# Patient Record
Sex: Female | Born: 1970 | Race: Black or African American | Hispanic: No | Marital: Married | Smoking: Never smoker
Health system: Southern US, Community
[De-identification: ages and names within clinical notes are randomized; demographics above are authoritative.]

## PROBLEM LIST (undated history)

## (undated) DIAGNOSIS — J45909 Unspecified asthma, uncomplicated: Secondary | ICD-10-CM

## (undated) DIAGNOSIS — M1990 Unspecified osteoarthritis, unspecified site: Secondary | ICD-10-CM

## (undated) DIAGNOSIS — J36 Peritonsillar abscess: Secondary | ICD-10-CM

## (undated) DIAGNOSIS — E119 Type 2 diabetes mellitus without complications: Secondary | ICD-10-CM

## (undated) DIAGNOSIS — I10 Essential (primary) hypertension: Secondary | ICD-10-CM

## (undated) HISTORY — PX: TUBAL LIGATION: SHX77

---

## 2007-02-01 ENCOUNTER — Emergency Department (HOSPITAL_COMMUNITY): Admission: EM | Admit: 2007-02-01 | Discharge: 2007-02-01 | Payer: Self-pay | Admitting: Emergency Medicine

## 2008-03-22 ENCOUNTER — Inpatient Hospital Stay (HOSPITAL_COMMUNITY): Admission: AD | Admit: 2008-03-22 | Discharge: 2008-03-24 | Payer: Self-pay | Admitting: Obstetrics and Gynecology

## 2008-03-23 ENCOUNTER — Encounter (INDEPENDENT_AMBULATORY_CARE_PROVIDER_SITE_OTHER): Payer: Self-pay | Admitting: Obstetrics and Gynecology

## 2011-05-02 NOTE — Op Note (Signed)
NAMEDagmar, [name]            ACCOUNT NO.:  [id number]   MEDICAL RECORD NO.:  [id number]          PATIENT TYPE:  INP   LOCATION:  9127                          FACILITY:  [facility]   PHYSICIAN:  Zenaida Niece, M.D.DATE OF BIRTH:  [birth year]   DATE OF PROCEDURE:  03/23/2008  DATE OF DISCHARGE:                               OPERATIVE REPORT   PREOPERATIVE DIAGNOSES:  1. Postpartum.  2. Desires surgical sterility.   POSTOPERATIVE DIAGNOSES:  1. Postpartum.  2. Desires surgical sterility.   PROCEDURE:  Bilateral partial salpingectomy.   SURGEON:  Zenaida Niece, M.D.   ANESTHESIA:  Epidural.   ESTIMATED BLOOD LOSS:  Less than 50 mL.   FINDINGS:  The patient had normal postpartum anatomy.   SPECIMENS:  Portions of bilateral fallopian tubes sent to pathology.   COMPLICATIONS:  None.   PROCEDURE IN DETAIL:  The patient was taken to the operating room and  placed in the dorsal supine position.  Her previously placed epidural  was already dosed.  The abdomen was then prepped and draped in the usual  sterile fashion.  The level of her anesthesia was found to be adequate.  Infraumbilical skin was infiltrated with quarter percent Marcaine and a  3 cm horizontal incision was made.  The incision was carried down to  fascia which was elevated and entered sharply with Mayo scissors.  This  also entered the peritoneal cavity.  Both fallopian tubes, first the  left and then the right were identified and traced to their fimbriated  ends.  A lap pad was used to pack bowel away on the right side.  The  middle portion of each tube was elevated with a Babcock clamp.  The  knuckle of tube was then ligated with zero plain gut suture.  This  knuckle of tube was then removed sharply.  On both sides both tubal  ostia were identified and the stumps were hemostatic.  The sponge on the  right side was then removed.  The  fascia was closed with running zero Vicryl.  The skin was then closed  with a running subcuticular suture of 4-0 Vicryl followed by a Band-Aid.  The patient tolerated the procedure well.  She was taken to the recovery  room in stable condition.  Counts were correct x2.      Zenaida Niece, M.D.  Electronically Signed     TDM/MEDQ  D:  03/23/2008  T:  03/23/2008  Job:  [id number]

## 2011-05-02 NOTE — Discharge Summary (Signed)
NAMETesia, [facility]            ACCOUNT NO.:  [id number]   MEDICAL RECORD NO.:  [id number]          PATIENT TYPE:  INP   LOCATION:  9127                          FACILITY:  [facility]   PHYSICIAN:  Zenaida Niece, M.D.DATE OF BIRTH:  1971/06/09   DATE OF ADMISSION:  03/22/2008  DATE OF DISCHARGE:  03/24/2008                               DISCHARGE SUMMARY   ADMISSION DIAGNOSIS:  Intrauterine pregnancy at 38 weeks, pregnancy-  induced hypertension desires surgical sterility.   DISCHARGE DIAGNOSIS:  Intrauterine pregnancy at 38 weeks, pregnancy-  induced hypertension desires surgical sterility.   PROCEDURE:  On March 22, 2008, she had a spontaneous vaginal delivery and  on March 23, 2008, she had a bilateral partial salpingectomy.   HISTORY AND PHYSICAL:  This is a 40 year old black female gravida 34,  para 6-1-1-7 with an EGA of [redacted] weeks, who presents with the complaint of  ruptured membranes at approximately 12:15 on March 22, 2008.  She then  had irregular contractions.  Evaluation in triage confirmed rupture of  membranes with light meconium and cervix was 4 cm dilated.  Prenatal  care complicated by UTIs, treated with Macrobid and the fact that she  wants tubal ligation.   PRENATAL LABS:  Her blood type is A+ with negative antibody screen, RPR  nonreactive, rubella immune, hepatitis B surface antigen negative, HIV  negative, gonorrhea and chlamydia negative, cystic fibrosis negative, 1-  hour Glucola 134, group B strep is negative.   PAST OB HISTORY:  Vaginal delivery at term x5 and at 36 weeks x1,  weights ranging from 5 pounds 3 ounces to 6 pounds 11 ounces.  She did  have PIH with her last pregnancy in 2003.   PAST GYN HISTORY:  Remote history of chlamydia.   PAST MEDICAL HISTORY:  Mild asthma,  hemoglobin AS.   PHYSICAL EXAMINATION:  VITAL SIGNS:  She is afebrile with stable vital  signs except blood pressures 14Os-150s/90-100.  HEART:  Fetal heart tracing is reassuring  with some variable  decelerations.  ABDOMEN:  Gravid, nontender with an estimated fetal weight of 7 pounds.  Cervix on my first exam was complete, complete and plus 2.   HOSPITAL COURSE:  The patient was admitted and started on Pitocin and  received an epidural.  PIH labs revealed no significant abnormalities.  Blood pressure remained labile.  She progressed to complete, pushed well  and on the afternoon of March 22, 2008, had a vaginal delivery of a  viable female infant with Apgars of 8 and 9, the weighed 6 pounds 2  ounces.  Loose nuchal cord and shoulder cord were reduced.  Placenta  delivered spontaneous was intact and perineum was intact and estimated  blood loss was less than 500 mL.   POSTPARTUM:  She had no significant complications.  Predelivery  hemoglobin 12.3, postdelivery is 12.0.  She did wish to undergo tubal  ligation.  This was performed on March 23, 2008, under epidural  anesthesia without complications.  On postpartum day #2, she was felt to  be stable enough for discharge home.   DISCHARGE INSTRUCTIONS:  Regular diet.  Pelvic rest.  No strenuous  activity.  Followup is in 6 weeks.   MEDICATIONS:  Medications are Percocet #30 one to two p.o. q.4-6 hours  p.r.n. pain and over-the-counter ibuprofen as needed and she is given  our discharge pamphlet.      Zenaida Niece, M.D.  Electronically Signed     TDM/MEDQ  D:  03/24/2008  T:  03/24/2008  Job:  [id number]

## 2011-08-28 ENCOUNTER — Emergency Department (HOSPITAL_COMMUNITY)
Admission: EM | Admit: 2011-08-28 | Discharge: 2011-08-28 | Disposition: A | Discharge status: Home / Self Care | Payer: Medicaid Other | Attending: Emergency Medicine | Admitting: Emergency Medicine

## 2011-08-28 DIAGNOSIS — Y92009 Unspecified place in unspecified non-institutional (private) residence as the place of occurrence of the external cause: Secondary | ICD-10-CM | POA: Insufficient documentation

## 2011-08-28 DIAGNOSIS — S01409A Unspecified open wound of unspecified cheek and temporomandibular area, initial encounter: Secondary | ICD-10-CM | POA: Insufficient documentation

## 2011-08-28 DIAGNOSIS — R51 Headache: Secondary | ICD-10-CM | POA: Insufficient documentation

## 2011-08-28 DIAGNOSIS — W1809XA Striking against other object with subsequent fall, initial encounter: Secondary | ICD-10-CM | POA: Insufficient documentation

## 2011-09-12 LAB — COMPREHENSIVE METABOLIC PANEL
ALT: 15
AST: 18
Albumin: 2.8 — ABNORMAL LOW
Alkaline Phosphatase: 106
BUN: 8
Chloride: 105
GFR calc Af Amer: >60
Potassium: 3.8
Sodium: 135
Total Bilirubin: 0.4
Total Protein: 6.3

## 2011-09-12 LAB — CCBB MATERNAL DONOR DRAW

## 2011-09-12 LAB — URINALYSIS, DIPSTICK ONLY
Glucose, UA: NEGATIVE
Hgb urine dipstick: NEGATIVE
Specific Gravity, Urine: 1.01

## 2011-09-12 LAB — CBC
HCT: 35.7 — ABNORMAL LOW
HCT: 36
Hemoglobin: 12
Platelets: 270
Platelets: 289
WBC: 10.9 — ABNORMAL HIGH
WBC: 7.7

## 2012-01-09 ENCOUNTER — Emergency Department (HOSPITAL_COMMUNITY): Payer: Medicaid Other

## 2012-01-09 ENCOUNTER — Emergency Department (HOSPITAL_COMMUNITY)
Admission: EM | Admit: 2012-01-09 | Discharge: 2012-01-09 | Disposition: A | Discharge status: Home / Self Care | Payer: Medicaid Other | Attending: Emergency Medicine | Admitting: Emergency Medicine

## 2012-01-09 ENCOUNTER — Encounter (HOSPITAL_COMMUNITY): Payer: Self-pay | Admitting: *Deleted

## 2012-01-09 DIAGNOSIS — W19XXXA Unspecified fall, initial encounter: Secondary | ICD-10-CM

## 2012-01-09 DIAGNOSIS — W108XXA Fall (on) (from) other stairs and steps, initial encounter: Secondary | ICD-10-CM | POA: Insufficient documentation

## 2012-01-09 DIAGNOSIS — M545 Low back pain, unspecified: Secondary | ICD-10-CM | POA: Insufficient documentation

## 2012-01-09 MED ORDER — OXYCODONE-ACETAMINOPHEN 5-325 MG PO TABS
2.0000 | ORAL_TABLET | Freq: Once | ORAL | Status: AC
  Administered 2012-01-09: 2 via ORAL
  Filled 2012-01-09: qty 2

## 2012-01-09 MED ORDER — DIAZEPAM 5 MG PO TABS
5.0000 mg | ORAL_TABLET | Freq: Once | ORAL | Status: AC
  Administered 2012-01-09: 5 mg via ORAL
  Filled 2012-01-09: qty 1

## 2012-01-09 MED ORDER — KETOROLAC TROMETHAMINE 60 MG/2ML IM SOLN
60.0000 mg | Freq: Once | INTRAMUSCULAR | Status: AC
  Administered 2012-01-09: 60 mg via INTRAMUSCULAR
  Filled 2012-01-09: qty 2

## 2012-01-09 MED ORDER — DIAZEPAM 5 MG PO TABS: 5.0000 mg | ORAL_TABLET | Freq: Three times a day (TID) | ORAL | Status: AC | PRN

## 2012-01-09 MED ORDER — OXYCODONE-ACETAMINOPHEN 5-325 MG PO TABS: 1.0000 | ORAL_TABLET | Freq: Four times a day (QID) | ORAL | Status: AC | PRN

## 2012-01-09 NOTE — ED Notes (Signed)
Pt states she fell down the stairs this am. Reports sever lower back. Pt reports when she fell she feels like she twisted her back. Pt states she laid down and rested and states that pain is not getting any better.

## 2012-01-09 NOTE — ED Provider Notes (Signed)
Medical screening examination/treatment/procedure(s) were performed by non-physician practitioner and as supervising physician I was immediately available for consultation/collaboration.   Gerhard Munch, MD 01/09/12 [phone]

## 2012-01-09 NOTE — ED Provider Notes (Signed)
History     CSN: [id number]  Arrival date & time 01/09/12  1209   First MD Initiated Contact with Patient 01/09/12 1351      Chief Complaint  Patient presents with  . Fall  . Back Pain     The history is provided by the patient.   the patient is an otherwise healthy mildly obese 41 year old female who reports she missed a step while walking down her stairs this morning carrying her daughter and fell onto her left hip. She now complains of pain to the lower back is sharp, constant, severe, nonradiating. She denies any associated numbness, weakness, fecal or urinary incontinence, saddle anesthesia. There has been no prior treatment.  History reviewed. No pertinent past medical history.  History reviewed. No pertinent past surgical history.  History reviewed. No pertinent family history.  History  Substance Use Topics  . Smoking status: Never Smoker   . Smokeless tobacco: Not on file  . Alcohol Use: No     Review of Systems  Musculoskeletal: Positive for back pain.   10 systems reviewed and are negative for acute change except as noted in the HPI.  Allergies  Ibuprofen  Home Medications  No current outpatient prescriptions on file.  BP 125/87  Pulse 86  Temp(Src) 98.1 F (36.7 C) (Oral)  Resp 18  SpO2 98%  Physical Exam  Nursing note and vitals reviewed. Constitutional: She is oriented to person, place, and time. She appears well-developed and well-nourished.       Uncomfortable appearing. Morbidly obese  HENT:  Head: Normocephalic and atraumatic.  Right Ear: External ear normal.  Left Ear: External ear normal.  Eyes: Pupils are equal, round, and reactive to light.  Neck: Normal range of motion. Neck supple.  Cardiovascular: Normal rate and regular rhythm.   Pulmonary/Chest: Effort normal. No respiratory distress. She exhibits no tenderness.  Abdominal: Soft. She exhibits no distension. There is no tenderness.  Musculoskeletal: Normal range of motion. She  exhibits tenderness. She exhibits no edema.       Cervical and thoracic regions are without any bony tenderness, muscular tenderness, spasm, deformity. The lumbar region does demonstrate midline tenderness to palpation as well as paralumbar tenderness, worse on the left with no palpable spasm or visible deformity. Ears full active range of motion of all extremities with good strength 5 out of 5 throughout  Neurological: She is alert and oriented to person, place, and time. She has normal reflexes. No cranial nerve deficit. Coordination normal.       Gait with antalgia but no apparent ataxia. Sensation is intact to light touch  Skin: Skin is warm and dry.  Psychiatric: She has a normal mood and affect.    ED Course  Procedures (including critical care time)  Labs Reviewed - No data to display Dg Lumbar Spine Complete  01/09/2012  *RADIOLOGY REPORT*  Clinical Data: Fall  LUMBAR SPINE - COMPLETE 4+ VIEW  Comparison: None.  Findings: Anatomic alignment.  No vertebral compression deformity. Moderate narrowing at L4-5 and L5 S1 with facet arthropathy.  No pars defect.  No definite acute fracture.  IMPRESSION: No acute bony pathology.  Degenerative change.  Original Report Authenticated By: Donavan Burnet, M.D.     Diagnoses 1: Fall Diagnosis 2: low back pain   MDM  2:45 PM Patient seen and evaluated. No gross motor or sensory deficit. Given midline tenderness, x-ray of the lumbar spine has been ordered. Pain medication and a muscle x-ray been ordered.  3:45 Patient reevaluated and informed of x-ray results. She complains of continued pain after medication administration. Additional pain medication has been ordered. She will be discharged home and has been advised to return if she develops any concerning symptoms such as numbness, incontinence, inability to walk.        [address] [place], [place] 01/09/12 [phone]

## 2012-09-02 ENCOUNTER — Emergency Department (HOSPITAL_COMMUNITY)
Admission: EM | Admit: 2012-09-02 | Discharge: 2012-09-02 | Disposition: A | Discharge status: Home / Self Care | Payer: Medicaid Other | Attending: Emergency Medicine | Admitting: Emergency Medicine

## 2012-09-02 ENCOUNTER — Encounter (HOSPITAL_COMMUNITY): Payer: Self-pay | Admitting: *Deleted

## 2012-09-02 ENCOUNTER — Emergency Department (HOSPITAL_COMMUNITY): Payer: Medicaid Other

## 2012-09-02 DIAGNOSIS — M549 Dorsalgia, unspecified: Secondary | ICD-10-CM | POA: Insufficient documentation

## 2012-09-02 DIAGNOSIS — W19XXXA Unspecified fall, initial encounter: Secondary | ICD-10-CM

## 2012-09-02 MED ORDER — HYDROCODONE-ACETAMINOPHEN 5-325 MG PO TABS
1.0000 | ORAL_TABLET | Freq: Once | ORAL | Status: AC
  Administered 2012-09-02: 1 via ORAL
  Filled 2012-09-02: qty 1

## 2012-09-02 MED ORDER — KETOROLAC TROMETHAMINE 60 MG/2ML IM SOLN
60.0000 mg | Freq: Once | INTRAMUSCULAR | Status: AC
Start: 2012-09-02 — End: 2012-09-02
  Administered 2012-09-02: 60 mg via INTRAMUSCULAR

## 2012-09-02 MED ORDER — CYCLOBENZAPRINE HCL 10 MG PO TABS: 5.0000 mg | ORAL_TABLET | Freq: Once | ORAL | Status: DC

## 2012-09-02 MED ORDER — IBUPROFEN 400 MG PO TABS
800.0000 mg | ORAL_TABLET | Freq: Once | ORAL | Status: AC
  Administered 2012-09-02: 800 mg via ORAL
  Filled 2012-09-02: qty 2

## 2012-09-02 MED ORDER — HYDROCODONE-ACETAMINOPHEN 5-500 MG PO TABS: 1.0000 | ORAL_TABLET | Freq: Four times a day (QID) | ORAL | Status: DC | PRN

## 2012-09-02 MED ORDER — CYCLOBENZAPRINE HCL 10 MG PO TABS: 10.0000 mg | ORAL_TABLET | Freq: Two times a day (BID) | ORAL | Status: DC | PRN

## 2012-09-02 MED ORDER — ONDANSETRON 4 MG PO TBDP
4.0000 mg | ORAL_TABLET | Freq: Once | ORAL | Status: AC
  Administered 2012-09-02: 4 mg via ORAL
  Filled 2012-09-02: qty 1

## 2012-09-02 MED ORDER — KETOROLAC TROMETHAMINE 30 MG/ML IJ SOLN
INTRAMUSCULAR | Status: AC
  Filled 2012-09-02: qty 2

## 2012-09-02 NOTE — ED Notes (Signed)
The pt fell in her bath tub just pta.  She fell backward into her tub.  She has pain in her lower back and her lt arm

## 2012-09-02 NOTE — ED Provider Notes (Signed)
History  This chart was scribed for Richardean Canal, MD by Bennett Scrape. This patient was seen in room TR11C/TR11C and the patient's care was started at 2030.  CSN: [id number]  Arrival date & time 09/02/12  4098   First MD Initiated Contact with Patient 09/02/12 2030      Chief Complaint  Patient presents with  . Fall     The history is provided by the patient. No language interpreter was used.    Elaine Marquez is a 41 y.o. female who presents to the Emergency Department complaining of sudden onset, constant lower back pain and left arm pain after a slip and fall in the tub about 15 minutes PTA. She reports that she landed on the side of the tub on her lower back and hit her left arm on the toliet. She denies head trauma or LOC. She denies taking OTC medications at home to improve symptoms. She also c/o mild nausea that she attributes to the pain and reports occasional back pain but denies that it is chronic in nature. She denies chills, fever, abdominal pain, emesis and rash as associated symptoms. She does not have a h/o chronic medical conditions. She denies smoking and alcohol use.   History reviewed. No pertinent past medical history.  History reviewed. No pertinent past surgical history.  No family history on file.  History  Substance Use Topics  . Smoking status: Never Smoker   . Smokeless tobacco: Not on file  . Alcohol Use: No    No OB history provided.  Review of Systems  Constitutional: Negative for fever and chills.  Gastrointestinal: Positive for nausea. Negative for vomiting and abdominal pain.  Musculoskeletal: Positive for back pain.       Positive for left arm pain  Skin: Negative for rash.    Allergies  Ibuprofen  Home Medications   Current Outpatient Rx  Name Route Sig Dispense Refill  . CYCLOBENZAPRINE HCL 10 MG PO TABS Oral Take 1 tablet (10 mg total) by mouth 2 (two) times daily as needed for muscle spasms. 20 tablet 0    Triage Vitals:  BP 153/106  Pulse 103  Temp 98.4 F (36.9 C) (Oral)  Resp 18  SpO2 99%  Physical Exam  Nursing note and vitals reviewed. Constitutional: She is oriented to person, place, and time. She appears well-developed and well-nourished. No distress.  HENT:  Head: Normocephalic and atraumatic.  Eyes: EOM are normal.  Neck: Neck supple. No tracheal deviation present.  Cardiovascular: Normal rate.   Pulmonary/Chest: Effort normal. No respiratory distress.  Abdominal: She exhibits no distension.  Musculoskeletal: Normal range of motion. She exhibits tenderness.       Left para lumbar tenderness, negative straight leg raise, normal ROM in bilateral hips, good sensation and motor  Neurological: She is alert and oriented to person, place, and time.  Skin: Skin is warm and dry.  Psychiatric: She has a normal mood and affect. Her behavior is normal.    ED Course  Procedures (including critical care time)  DIAGNOSTIC STUDIES: Oxygen Saturation is 99% on room air, normal by my interpretation.    COORDINATION OF CARE: 2039-Discussed treatment plan which includes x-rays, Zofran and 800 mg ibuprofen with pt at bedside and pt agreed to plan.     Labs Reviewed - No data to display Dg Lumbar Spine Complete  09/02/2012  *RADIOLOGY REPORT*  Clinical Data: Fall  LUMBAR SPINE - COMPLETE 4+ VIEW  Comparison: None.  Findings: Normal alignment and  no fracture.  Mild disc space narrowing L3-4, L4-5, and L5-S1.  Negative for pars defect.  IMPRESSION: Negative for fracture.   Original Report Authenticated By: Camelia Phenes, M.D.    Dg Pelvis 1-2 Views  09/02/2012  *RADIOLOGY REPORT*  Clinical Data: Fall  PELVIS - 1-2 VIEW  Comparison: None.  Findings: Negative for fracture.  Mild degenerative change in the hip bilaterally, right greater than left with acetabular spurring. Minimal joint space narrowing.  Negative for AVN.  IMPRESSION: Negative for fracture.   Original Report Authenticated By: Camelia Phenes,  M.D.      1. Back pain   2. Fall at home       MDM  Elaine Marquez is a 41 y.o. female here s/p fall. No fracture on lumbar and pelvis xray. Patient's pain improved with toradol. Will d/c home on motrin and flexeril.    This document was completed by the scribe at my direction and I have reviewed its accuracy. I have personally examined the patient and agrees with the above document.   Chaney Malling, MD     Richardean Canal, MD 09/02/12 2157

## 2012-09-02 NOTE — ED Notes (Signed)
States mechanical fall while getting out of tub this evening; no LOC; remembers all events prior to and after fall; c/o pain to left hip area radiating into left mid back as well as into left buttock; states able to ambulate; however, this exacerbates the pain

## 2012-09-02 NOTE — ED Notes (Signed)
Patient refused to take the Motrin.  States "it tears up my stomach"

## 2012-09-02 NOTE — ED Notes (Signed)
Patient transported to X-ray 

## 2013-08-11 ENCOUNTER — Emergency Department (HOSPITAL_COMMUNITY): Payer: Medicaid Other

## 2013-08-11 ENCOUNTER — Emergency Department (HOSPITAL_COMMUNITY)
Admission: EM | Admit: 2013-08-11 | Discharge: 2013-08-11 | Disposition: A | Discharge status: Home / Self Care | Payer: Medicaid Other | Attending: Emergency Medicine | Admitting: Emergency Medicine

## 2013-08-11 ENCOUNTER — Encounter (HOSPITAL_COMMUNITY): Payer: Self-pay | Admitting: Emergency Medicine

## 2013-08-11 DIAGNOSIS — Z79899 Other long term (current) drug therapy: Secondary | ICD-10-CM | POA: Insufficient documentation

## 2013-08-11 DIAGNOSIS — J45901 Unspecified asthma with (acute) exacerbation: Secondary | ICD-10-CM | POA: Insufficient documentation

## 2013-08-11 HISTORY — DX: Unspecified asthma, uncomplicated: J45.909

## 2013-08-11 MED ORDER — ALBUTEROL SULFATE (5 MG/ML) 0.5% IN NEBU
5.0000 mg | INHALATION_SOLUTION | Freq: Once | RESPIRATORY_TRACT | Status: AC
  Administered 2013-08-11: 5 mg via RESPIRATORY_TRACT

## 2013-08-11 MED ORDER — HYDROCODONE-ACETAMINOPHEN 5-325 MG PO TABS: 2.0000 | ORAL_TABLET | Freq: Once | ORAL | Status: DC

## 2013-08-11 MED ORDER — PREDNISONE 20 MG PO TABS
60.0000 mg | ORAL_TABLET | Freq: Once | ORAL | Status: AC
  Administered 2013-08-11: 60 mg via ORAL
  Filled 2013-08-11: qty 3
  Filled 2013-08-11: qty 2

## 2013-08-11 MED ORDER — ALBUTEROL SULFATE (5 MG/ML) 0.5% IN NEBU
2.5000 mg | INHALATION_SOLUTION | Freq: Once | RESPIRATORY_TRACT | Status: AC
  Administered 2013-08-11: 2.5 mg via RESPIRATORY_TRACT
  Filled 2013-08-11: qty 0.5

## 2013-08-11 MED ORDER — ALBUTEROL SULFATE (2.5 MG/3ML) 0.083% IN NEBU: 2.5000 mg | INHALATION_SOLUTION | Freq: Four times a day (QID) | RESPIRATORY_TRACT | Status: DC | PRN

## 2013-08-11 MED ORDER — ALBUTEROL SULFATE HFA 108 (90 BASE) MCG/ACT IN AERS
2.0000 | INHALATION_SPRAY | Freq: Once | RESPIRATORY_TRACT | Status: AC
  Administered 2013-08-11: 2 via RESPIRATORY_TRACT
  Filled 2013-08-11: qty 6.7

## 2013-08-11 MED ORDER — PREDNISONE 20 MG PO TABS: 40.0000 mg | ORAL_TABLET | Freq: Every day | ORAL | Status: DC

## 2013-08-11 MED ORDER — IPRATROPIUM BROMIDE 0.02 % IN SOLN
0.5000 mg | Freq: Once | RESPIRATORY_TRACT | Status: AC
  Administered 2013-08-11: 0.5 mg via RESPIRATORY_TRACT
  Filled 2013-08-11: qty 2.5

## 2013-08-11 MED ORDER — ALBUTEROL SULFATE (5 MG/ML) 0.5% IN NEBU
INHALATION_SOLUTION | RESPIRATORY_TRACT | Status: AC
  Filled 2013-08-11: qty 1

## 2013-08-11 NOTE — ED Notes (Signed)
Approached triage in visible distress. Taken back to triage room, wheezing, SOB, diaphoretic. NO CP.

## 2013-08-11 NOTE — ED Provider Notes (Signed)
CSN: [id number]     Arrival date & time 08/11/13  1952 History   First MD Initiated Contact with Patient 08/11/13 2150     Chief Complaint  Patient presents with  . Shortness of Breath   (Consider location/radiation/quality/duration/timing/severity/associated sxs/prior Treatment) HPI Comments: Denies hx of intubations secondary to asthma exacerbation  Patient is a 42 y.o. female presenting with shortness of breath. The history is provided by the patient. No language interpreter was used.  Shortness of Breath Severity:  Moderate Onset quality:  Gradual Duration:  2 days Timing:  Constant Progression:  Worsening Chronicity:  New Context: not animal exposure, not fumes, not known allergens, not URI and not weather changes   Relieved by:  Nothing Worsened by:  Activity and exertion Ineffective treatments:  Inhaler Associated symptoms: diaphoresis and wheezing   Associated symptoms: no chest pain, no fever, no hemoptysis, no rash, no sputum production, no syncope and no vomiting   Risk factors: obesity   Risk factors: no hx of PE/DVT, no prolonged immobilization, no recent surgery and no tobacco use     Past Medical History  Diagnosis Date  . Asthma    History reviewed. No pertinent past surgical history. History reviewed. No pertinent family history. History  Substance Use Topics  . Smoking status: Never Smoker   . Smokeless tobacco: Not on file  . Alcohol Use: No   OB History   Grav Para Term Preterm Abortions TAB SAB Ect Mult Living                 Review of Systems  Constitutional: Positive for diaphoresis. Negative for fever.  Eyes: Negative for visual disturbance.  Respiratory: Positive for shortness of breath and wheezing. Negative for hemoptysis and sputum production.   Cardiovascular: Negative for chest pain and syncope.  Gastrointestinal: Negative for nausea and vomiting.  Skin: Negative for rash.  Neurological: Negative for dizziness, syncope, weakness and  light-headedness.  All other systems reviewed and are negative.   Allergies  Ibuprofen  Home Medications   Current Outpatient Rx  Name  Route  Sig  Dispense  Refill  . HYDROcodone-acetaminophen (VICODIN) 5-500 MG per tablet   Oral   Take 1-2 tablets by mouth every 6 (six) hours as needed for pain.   8 tablet   0   . albuterol (PROVENTIL) (2.5 MG/3ML) 0.083% nebulizer solution   Nebulization   Take 3 mLs (2.5 mg total) by nebulization every 6 (six) hours as needed for wheezing.   75 mL   0   . predniSONE (DELTASONE) 20 MG tablet   Oral   Take 2 tablets (40 mg total) by mouth daily.   10 tablet   0    BP 142/92  Pulse 99  Temp(Src) 98.4 F (36.9 C) (Oral)  Resp 22  SpO2 98%  Physical Exam  Nursing note and vitals reviewed. Constitutional: She is oriented to person, place, and time. She appears well-developed and well-nourished. No distress.  Patient evaluated after first albuterol neb treatment; patient in no respiratory distress.  HENT:  Head: Normocephalic and atraumatic.  Mouth/Throat: Oropharynx is clear and moist. No oropharyngeal exudate.  Eyes: Conjunctivae and EOM are normal. Pupils are equal, round, and reactive to light. No scleral icterus.  Neck: Normal range of motion.  Airway patent  Cardiovascular: Normal rate, regular rhythm, normal heart sounds and intact distal pulses.   Pulmonary/Chest: Effort normal. No respiratory distress. She has wheezes. She has no rales.  No retractions or accessory muscle use  Abdominal: Soft. There is no tenderness. There is no rebound and no guarding.  Musculoskeletal: Normal range of motion.  Neurological: She is alert and oriented to person, place, and time.  Skin: Skin is warm and dry. No rash noted. She is not diaphoretic. No erythema. No pallor.  Psychiatric: She has a normal mood and affect. Her behavior is normal.    ED Course  Procedures (including critical care time)  Labs Review Labs Reviewed - No data to  display  Imaging Review Dg Chest 2 View  08/11/2013   *RADIOLOGY REPORT*  Clinical Data: Shortness of breath  CHEST - 2 VIEW  Comparison: None.  Findings: Cardiomediastinal silhouette is within normal limits. The lungs are clear. No pleural effusion.  No pneumothorax.  No acute osseous abnormality.  IMPRESSION: Normal chest.   Original Report Authenticated By: Christiana Pellant, M.D.   Imaging Review No results found.  Date: 08/11/2013  Rate: 81  Rhythm: normal sinus rhythm  QRS Axis: normal  Intervals: normal  ST/T Wave abnormalities: normal  Conduction Disutrbances:none  Narrative Interpretation: NSR without STEMI  Old EKG Reviewed: none available I have personally reviewed and interpreted this EKG  MDM   1. Asthma exacerbation    Patient with hx of asthma presents for gradual worsening SOB with associated wheezing and diaphoresis. Patient given albuterol nebulizer tx in triage with improvement in symptoms. Physical exam with diffuse expiratory wheezes and adequate air movement; no accessory muscle use or retractions. CXR without evidence of PNA, PTX, pleural effusion, or other acute cardiopulmonary changes. EKG shows NSR without STEMI or ischemic changes. PO prednisone and second neb tx (duoneb) ordered. Will reassess.  Patient with decreased wheezing in all lung fields and improved air movement after second neb. Patient states she is feeling much better and wants to go home. She is able to ambulate without hypoxia. Patient stable and appropriate for d/c with PCP follow up. Rx for prednisone and albuterol neb solution given for symptoms; patient given albuterol inhaler in ED. Indications for ED return discussed and patient agreeable to plan with no unaddressed concerns.   Antony Madura, PA-C 08/14/13 1721

## 2013-08-15 NOTE — ED Provider Notes (Signed)
Medical screening examination/treatment/procedure(s) were performed by non-physician practitioner and as supervising physician I was immediately available for consultation/collaboration.   Loren Racer, MD 08/15/13 [phone]

## 2014-08-17 ENCOUNTER — Encounter: Payer: Self-pay | Admitting: Physician Assistant

## 2014-08-31 ENCOUNTER — Encounter: Payer: Self-pay | Admitting: Physician Assistant

## 2014-11-29 ENCOUNTER — Encounter (HOSPITAL_COMMUNITY): Payer: Self-pay | Admitting: *Deleted

## 2014-11-29 ENCOUNTER — Emergency Department (HOSPITAL_COMMUNITY)
Admission: EM | Admit: 2014-11-29 | Discharge: 2014-11-29 | Disposition: A | Discharge status: Home / Self Care | Payer: Medicaid Other | Attending: Emergency Medicine | Admitting: Emergency Medicine

## 2014-11-29 ENCOUNTER — Emergency Department (HOSPITAL_COMMUNITY): Payer: Medicaid Other

## 2014-11-29 DIAGNOSIS — J36 Peritonsillar abscess: Secondary | ICD-10-CM | POA: Insufficient documentation

## 2014-11-29 DIAGNOSIS — Z7952 Long term (current) use of systemic steroids: Secondary | ICD-10-CM | POA: Insufficient documentation

## 2014-11-29 DIAGNOSIS — J45909 Unspecified asthma, uncomplicated: Secondary | ICD-10-CM | POA: Diagnosis not present

## 2014-11-29 DIAGNOSIS — Z79899 Other long term (current) drug therapy: Secondary | ICD-10-CM | POA: Insufficient documentation

## 2014-11-29 DIAGNOSIS — H9209 Otalgia, unspecified ear: Secondary | ICD-10-CM | POA: Diagnosis not present

## 2014-11-29 DIAGNOSIS — J029 Acute pharyngitis, unspecified: Secondary | ICD-10-CM | POA: Diagnosis present

## 2014-11-29 HISTORY — DX: Peritonsillar abscess: J36

## 2014-11-29 LAB — CBC
HCT: 39.6 % (ref 36.0–46.0)
Hemoglobin: 13.3 g/dL (ref 12.0–15.0)
MCH: 30.3 pg (ref 26.0–34.0)
MCHC: 33.6 g/dL (ref 30.0–36.0)
MCV: 90.2 fL (ref 78.0–100.0)
Platelets: 537 10*3/uL — ABNORMAL HIGH (ref 150–400)
RBC: 4.39 MIL/uL (ref 3.87–5.11)
RDW: 14.2 % (ref 11.5–15.5)
WBC: 9.4 10*3/uL (ref 4.0–10.5)

## 2014-11-29 LAB — BASIC METABOLIC PANEL
Anion gap: 15 (ref 5–15)
BUN: 8 mg/dL (ref 6–23)
CO2: 22 mEq/L (ref 19–32)
Calcium: 9.8 mg/dL (ref 8.4–10.5)
Chloride: 98 mEq/L (ref 96–112)
Creatinine, Ser: 0.66 mg/dL (ref 0.50–1.10)
GFR calc Af Amer: >90 mL/min (ref >90)
GFR calc non Af Amer: >90 mL/min (ref >90)
Glucose, Bld: 108 mg/dL — ABNORMAL HIGH (ref 70–99)
Potassium: 4.3 mEq/L (ref 3.7–5.3)
Sodium: 135 mEq/L — ABNORMAL LOW (ref 137–147)

## 2014-11-29 LAB — RAPID STREP SCREEN (MED CTR MEBANE ONLY): STREPTOCOCCUS, GROUP A SCREEN (DIRECT): NEGATIVE

## 2014-11-29 MED ORDER — IOHEXOL 300 MG/ML  SOLN
75.0000 mL | Freq: Once | INTRAMUSCULAR | Status: AC | PRN
  Administered 2014-11-29: 75 mL via INTRAVENOUS

## 2014-11-29 MED ORDER — HYDROMORPHONE HCL 1 MG/ML IJ SOLN
1.0000 mg | Freq: Once | INTRAMUSCULAR | Status: AC
  Administered 2014-11-29: 1 mg via INTRAVENOUS
  Filled 2014-11-29: qty 1

## 2014-11-29 MED ORDER — CLINDAMYCIN HCL 150 MG PO CAPS: 450.0000 mg | ORAL_CAPSULE | Freq: Three times a day (TID) | ORAL | Status: DC

## 2014-11-29 MED ORDER — DEXAMETHASONE SODIUM PHOSPHATE 10 MG/ML IJ SOLN
20.0000 mg | Freq: Once | INTRAMUSCULAR | Status: AC
  Administered 2014-11-29: 20 mg via INTRAVENOUS
  Filled 2014-11-29: qty 2

## 2014-11-29 MED ORDER — CLINDAMYCIN PHOSPHATE 900 MG/50ML IV SOLN
900.0000 mg | Freq: Once | INTRAVENOUS | Status: AC
  Administered 2014-11-29: 900 mg via INTRAVENOUS
  Filled 2014-11-29: qty 50

## 2014-11-29 MED ORDER — OXYCODONE-ACETAMINOPHEN 5-325 MG PO TABS
1.0000 | ORAL_TABLET | Freq: Once | ORAL | Status: AC
  Administered 2014-11-29: 1 via ORAL
  Filled 2014-11-29: qty 1

## 2014-11-29 MED ORDER — SODIUM CHLORIDE 0.9 % IV BOLUS (SEPSIS)
1000.0000 mL | Freq: Once | INTRAVENOUS | Status: AC
  Administered 2014-11-29: 1000 mL via INTRAVENOUS

## 2014-11-29 MED ORDER — MORPHINE SULFATE 4 MG/ML IJ SOLN
4.0000 mg | Freq: Once | INTRAMUSCULAR | Status: AC
  Administered 2014-11-29: 4 mg via INTRAVENOUS
  Filled 2014-11-29: qty 1

## 2014-11-29 MED ORDER — HYDROCODONE-HOMATROPINE 5-1.5 MG/5ML PO SYRP: 5.0000 mL | ORAL_SOLUTION | Freq: Four times a day (QID) | ORAL | Status: DC | PRN

## 2014-11-29 NOTE — ED Provider Notes (Signed)
CSN: [id number]     Arrival date & time 11/29/14  1423 History   First MD Initiated Contact with Patient 11/29/14 1608     Chief Complaint  Patient presents with  . Sore Throat  . Otalgia     (Consider location/radiation/quality/duration/timing/severity/associated sxs/prior Treatment) HPI Patient presents to the emergency department with sore throat and earache for the past one week.  The patient states that she was recently exposed to her daughter who was sick and prescribed antibiotics.  The patient states she has a history of peritonsillar abscess.  Patient states there is more pain with swallowing.  She states that she did not take any medications prior to arrival.  The patient states she has not had any chest pain, shortness of breath, nausea, vomiting, diarrhea, fever, weakness, dizziness, headache, blurred vision, back pain, neck swelling, rash, cough, near syncope, lightheadedness or syncope.  The patient states that seems make her condition better Past Medical History  Diagnosis Date  . Asthma   . Peritonsillar abscess    History reviewed. No pertinent past surgical history. No family history on file. History  Substance Use Topics  . Smoking status: Never Smoker   . Smokeless tobacco: Not on file  . Alcohol Use: No   OB History    No data available     Review of Systems  All other systems negative except as documented in the HPI. All pertinent positives and negatives as reviewed in the HPI.  Allergies  Ibuprofen  Home Medications   Prior to Admission medications   Medication Sig Start Date End Date Taking? Authorizing Provider  albuterol (PROVENTIL) (2.5 MG/3ML) 0.083% nebulizer solution Take 3 mLs (2.5 mg total) by nebulization every 6 (six) hours as needed for wheezing. 08/11/13   Antony MaduraKelly Humes, PA-C  HYDROcodone-acetaminophen (VICODIN) 5-500 MG per tablet Take 1-2 tablets by mouth every 6 (six) hours as needed for pain. 09/02/12   Richardean Canalavid H Yao, MD  predniSONE  (DELTASONE) 20 MG tablet Take 2 tablets (40 mg total) by mouth daily. 08/11/13   Antony MaduraKelly Humes, PA-C   BP 148/93 mmHg  Pulse 91  Temp(Src) 98.1 F (36.7 C) (Oral)  Resp 16  SpO2 96% Physical Exam  Constitutional: She is oriented to person, place, and time. She appears well-developed and well-nourished. No distress.  HENT:  Head: Normocephalic and atraumatic.  Mouth/Throat:    Eyes: Pupils are equal, round, and reactive to light.  Neck: Normal range of motion. Neck supple.  Cardiovascular: Normal rate, regular rhythm and normal heart sounds.  Exam reveals no gallop and no friction rub.   No murmur heard. Pulmonary/Chest: Effort normal and breath sounds normal. No respiratory distress. She has no wheezes.  Musculoskeletal: She exhibits no edema.  Lymphadenopathy:    She has cervical adenopathy.  Neurological: She is alert and oriented to person, place, and time. She exhibits normal muscle tone. Coordination normal.  Skin: Skin is warm and dry. No rash noted. No erythema.  Psychiatric: She has a normal mood and affect. Her behavior is normal.  Nursing note and vitals reviewed.   ED Course  Procedures (including critical care time) Labs Review Labs Reviewed  BASIC METABOLIC PANEL - Abnormal; Notable for the following:    Sodium 135 (*)    Glucose, Bld 108 (*)    All other components within normal limits  CBC - Abnormal; Notable for the following:    Platelets 537 (*)    All other components within normal limits  RAPID STREP SCREEN  CULTURE, GROUP A STREP    Imaging Review No results found.    I spoke with Dr. Suszanne Connerseoh of ENT who will see the patient in follow-up in his office.  I gave him a report of the CT scan findings.  He advised to give Decadron and clindamycin   Carlyle DollyChristopher W Curstin Schmale, PA-C 12/04/14 [id number]  Elwin MochaBlair Walden, MD 12/04/14 [phone]

## 2014-11-29 NOTE — ED Notes (Signed)
Patient transported to CT 

## 2014-11-29 NOTE — ED Notes (Addendum)
Pt states that she has had a sore throat and earache on the rt side for 1 week. Pt states that her daughter was recently sick as well and was given antibiotics. Pt states that she has hx of peritonsilar abcess. Pt reports pain with swallowing but denies any difficulty swallowing. NAD noted

## 2014-11-29 NOTE — Discharge Instructions (Signed)
Call the ear, nose and throat doctor in the morning for an appointment in the next 1-2 days.  Increase her fluid intake, rest as much as possible.  Return here for any worsening in your condition

## 2014-11-30 MED ORDER — BENZONATATE 100 MG PO CAPS: 100.0000 mg | ORAL_CAPSULE | Freq: Three times a day (TID) | ORAL | Status: DC

## 2014-12-01 LAB — CULTURE, GROUP A STREP

## 2015-07-09 ENCOUNTER — Ambulatory Visit: Payer: Medicaid Other | Admitting: Physical Therapy

## 2015-07-27 ENCOUNTER — Ambulatory Visit: Payer: Medicaid Other | Attending: Physical Therapy | Admitting: Physical Therapy

## 2016-10-18 ENCOUNTER — Other Ambulatory Visit (INDEPENDENT_AMBULATORY_CARE_PROVIDER_SITE_OTHER): Payer: Self-pay

## 2016-10-18 MED ORDER — CELECOXIB 100 MG PO CAPS: 100.0000 mg | ORAL_CAPSULE | Freq: Two times a day (BID) | ORAL | 2 refills | Status: DC

## 2016-10-18 NOTE — Telephone Encounter (Signed)
[facility] Request for  Celebrex 100MG  Capsule QTY: 60  Last filled: 09/02/2016 Patient to Take one capsule by mouth twice daily.

## 2016-10-27 ENCOUNTER — Encounter (INDEPENDENT_AMBULATORY_CARE_PROVIDER_SITE_OTHER): Payer: Self-pay | Admitting: Sports Medicine

## 2016-10-27 ENCOUNTER — Ambulatory Visit (INDEPENDENT_AMBULATORY_CARE_PROVIDER_SITE_OTHER): Payer: Medicaid Other

## 2016-10-27 ENCOUNTER — Ambulatory Visit (INDEPENDENT_AMBULATORY_CARE_PROVIDER_SITE_OTHER): Payer: Medicaid Other | Admitting: Sports Medicine

## 2016-10-27 VITALS — BP 152/97 | HR 109 | Ht 64.0 in | Wt 250.0 lb

## 2016-10-27 DIAGNOSIS — M25562 Pain in left knee: Secondary | ICD-10-CM

## 2016-10-27 MED ORDER — BUPIVACAINE HCL 0.5 % IJ SOLN
2.0000 mL | INTRAMUSCULAR | Status: AC | PRN
  Administered 2016-10-27: 2 mL via INTRA_ARTICULAR

## 2016-10-27 MED ORDER — METHYLPREDNISOLONE ACETATE 40 MG/ML IJ SUSP
80.0000 mg | INTRAMUSCULAR | Status: AC | PRN
  Administered 2016-10-27: 80 mg

## 2016-10-27 NOTE — Progress Notes (Signed)
Elaine Marquez - 45 y.o. female MRN [id number]  Date of birth: 02-08-1971  Office Visit Note: Visit Date: 10/27/2016 PCP: Frazier RichardsIXON,MARY BETH, PA-C Referred by: Dorena Bodoixon, Mary B, PA-C  Subjective: Chief Complaint  Patient presents with  . Left Knee - Follow-up  . Follow-up    Patient states left knee pain, sharp shooting pain.  Hurts to walk and bend.  Thinks it may be swollen.  Left knee pain for about 1-2 weeks.    HPI: Patient has had significant worsening of the past 3 weeks of severe left lateral knee pain. The pain does not radiate. Pain is worse with walking standing or bending of the knee. She has not had any falls associated with this. She has no crepitations. Pain is more severe today on the left knee that was previously on the right that responded well to injection therapy. She is not tried any specific medications for this but is requesting narcotic pain medicines.  ROS: She denies any fevers, chills recently gain or weight loss.. Otherwise per HPI.  Assessment & Plan: Visit Diagnoses:  1. Acute pain of left knee     Plan: We will go ahead & inject her knee today. If any lack of improvement we will plan to further evaluate her low back as well. Will plan follow-up with her in 4 weeks for clinical reevaluation & if any persistent low back symptoms will need 2V x-ray lumbar spine at that time.   Large Joint Inj Date/Time: 10/27/2016 3:09 PM Performed by: Gaspar BiddingIGBY, Mita Vallo D Authorized by: Gaspar BiddingIGBY, Kenzey Birkland D   Indications:  Pain and joint swelling Location:  Knee Site:  R knee Needle Size:  18 G Approach:  Superolateral Ultrasound Guidance: Yes   Fluoroscopic Guidance: No   Arthrogram: No   Medications:  2 mL bupivacaine 0.5 %; 80 mg methylPREDNISolone acetate 40 MG/ML  The patient's clinical condition is marked by substantial pain and/or significant functional disability. Other conservative therapy has not provided relief, is contraindicated, or not appropriate. There is a  reasonable likelihood that injection will significantly improve the patient's pain and/or functional impairment.  After discussing the risks, benefits and expected outcomes of the injection and all questions were reviewed and answered, the patient wished to undergo the above named procedure.  Verbal consent was obtained. The target sight was prepped with alcohol scrub. Local anesthesia was obtained with ethyl chloride and 3mL of 1% lidocaine on a 25g needle.  Under real-time ultrasound guidance, Injection of the target structure was performed using the above 25g needle and medications under sterile stopcock technique. Band-Aid was applied. The patient tolerated this procedure well with no immediate complications. Post injection instructions were provided.      Meds: No orders of the defined types were placed in this encounter.  Follow-up: Return in about 4 weeks (around 11/24/2016).   Clinical History: No specialty comments available.  She reports that she has never smoked. She does not have any smokeless tobacco history on file. No results for input(s): HGBA1C, LABURIC in the last 8760 hours.  Objective:  VS:  HT:5\' 4"  (162.6 cm)   WT:250 lb (113.4 kg)  BMI:43    BP:(!) 152/97  HR:(!) 109bpm  TEMP: ( )  RESP:  Physical Exam: Adult obese female. No acute distress. Alert & appropriate. Her bilateral lower extremities are quite large but overall well aligned. Minimal genu valgus. She has full flexion & extension of bilateral knee she has focal medial & lateral joint line tenderness with positive  petition with McMurray's testing. She has pain with varus & valgus testing but these are stable with only 3-4 mm of opening. Anterior posterior drawer are intact. No significant pretibial edema. Imaging: Xr Knee 1-2 Views Left  Result Date: 10/27/2016 Bilateral knees have mild to moderate generative change most notably within the medial compartments. There is no significant effusion. She is in slight  genu valgus. Mild subchondral sclerosis with mild osteophytic spurring. 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

## 2016-11-21 ENCOUNTER — Ambulatory Visit (INDEPENDENT_AMBULATORY_CARE_PROVIDER_SITE_OTHER): Payer: Medicaid Other | Admitting: Sports Medicine

## 2017-03-29 ENCOUNTER — Ambulatory Visit (INDEPENDENT_AMBULATORY_CARE_PROVIDER_SITE_OTHER): Payer: Medicaid Other

## 2017-03-29 ENCOUNTER — Encounter (INDEPENDENT_AMBULATORY_CARE_PROVIDER_SITE_OTHER): Payer: Self-pay | Admitting: Orthopedic Surgery

## 2017-03-29 ENCOUNTER — Ambulatory Visit (INDEPENDENT_AMBULATORY_CARE_PROVIDER_SITE_OTHER): Payer: Medicaid Other | Admitting: Orthopedic Surgery

## 2017-03-29 DIAGNOSIS — M25561 Pain in right knee: Secondary | ICD-10-CM

## 2017-03-29 DIAGNOSIS — M25562 Pain in left knee: Secondary | ICD-10-CM

## 2017-03-29 DIAGNOSIS — M5442 Lumbago with sciatica, left side: Secondary | ICD-10-CM

## 2017-03-29 MED ORDER — METHOCARBAMOL 500 MG PO TABS: 500.0000 mg | ORAL_TABLET | Freq: Three times a day (TID) | ORAL | 0 refills | Status: DC | PRN

## 2017-03-29 NOTE — Addendum Note (Signed)
Addended byPrescott Parma on: 03/29/2017 02:45 PM   Modules accepted: Orders

## 2017-03-29 NOTE — Progress Notes (Signed)
Office Visit Note   Patient: Elaine Marquez           Date of Birth: 01/11/1971           MRN: [id number] Visit Date: 03/29/2017 Requested by: Dorena Bodo, PA-C [address] [place], [place] [address] PCP: Frazier Richards, PA-C  Subjective: Chief Complaint  Patient presents with  . Left Knee - Pain  . Right Knee - Pain  . Lower Back - Pain    HPI: Patient is a 46 year old female with low back and left leg pain of many years duration along with bilateral knee pain left worse than right.  She states the knee pain on the left is much worse with activity.  Reports constant aching and popping but no discrete locking.  She's had an injection in that left knee in the past by another doctor and she bowels to never do that again because of the pain she had in the following 3 days after the injection.  She does report recent weight loss of about 40 pounds.  She denies any discrete mechanical symptoms in the knee but just localizes global pain in that area.  Denies much in way of numbness and tingling but does report discrete radicular-type symptoms running down into the foot as of last night but typically down into the mid calf region over the past several months.  She has not taken much in terms of anything more than over-the-counter medication for the problem              ROS: All systems reviewed are negative as they relate to the chief complaint within the history of present illness.  Patient denies  fevers or chills.   Assessment & Plan: Visit Diagnoses:  1. Pain in both knees, unspecified chronicity   2. Left-sided low back pain with left-sided sciatica, unspecified chronicity     Plan: Impression is low back pain with possible early left radiculopathy.  No paresthesias or nerve l root tension signs today.  I think on the back were to try her with a muscle relaxer and have her do some stretching and walking exercises.  In regards to the knee she's got on plain radiographs not  much in way of joint space narrowing.  Examination is pretty benign but she does have some focal joint line tenderness.  I would favor MRI scan of the left knee to evaluate for meniscal pathology versus early arthritis.  That will guide [place] to whether or not we need to perform any type of arthroscopy.  I'll see her back after that study  Follow-Up Instructions: No Follow-up on file.   Orders:  Orders Placed This Encounter  Procedures  . XR Lumbar Spine 2-3 Views   No orders of the defined types were placed in this encounter.     Procedures: No procedures performed   Clinical Data: No additional findings.  Objective: Vital Signs: There were no vitals taken for this visit.  Physical Exam:   Constitutional: Patient appears well-developed HEENT:  Head: Normocephalic Eyes:EOM are normal Neck: Normal range of motion Cardiovascular: Normal rate Pulmonary/chest: Effort normal Neurologic: Patient is alert Skin: Skin is warm Psychiatric: Patient has normal mood and affect    Ortho Exam: Orthopedic exam demonstrates pretty normal body mass index palpable pedal pulses no nerve retention signs no groin pain with internal/external rotation leg no other masses lymph adenopathy or skin changes noted in the back or knee region.  Mild right-sided trochanteric  tenderness no tenderness on the left.  Some pain with forward lateral bending.  Knee exam demonstrates full active and passive range of motion no effusion intact since mechanism mild patellofemoral crepitus stable collateral cruciate ligaments positive McMurray compression testing bilaterally for both medial lateral compartment pathology on the left knee  Specialty Comments:  No specialty comments available.  Imaging: Xr Lumbar Spine 2-3 Views  Result Date: 03/29/2017 AP lateral lumbar spine reviewed.  Visualized the joints appear non-arthritic.  No significant degenerative disc disease in the lumbar spine.  Facet arthritis is  present.  No significant spondylolisthesis or compression fractures noted.    PMFS History: There are no active problems to display for this patient.  Past Medical History:  Diagnosis Date  . Asthma   . Peritonsillar abscess     No family history on file.  No past surgical history on file. Social History   Occupational History  . Not on file.   Social History Main Topics  . Smoking status: Never Smoker  . Smokeless tobacco: Not on file  . Alcohol use No  . Drug use: No  . Sexual activity: Not on file

## 2017-04-17 ENCOUNTER — Ambulatory Visit
Admission: RE | Admit: 2017-04-17 | Discharge: 2017-04-17 | Disposition: A | Discharge status: Home / Self Care | Payer: Medicaid Other | Source: Ambulatory Visit | Attending: Orthopedic Surgery | Admitting: Orthopedic Surgery

## 2017-04-17 DIAGNOSIS — M25561 Pain in right knee: Secondary | ICD-10-CM

## 2017-04-17 DIAGNOSIS — M25562 Pain in left knee: Principal | ICD-10-CM

## 2017-04-19 ENCOUNTER — Telehealth (INDEPENDENT_AMBULATORY_CARE_PROVIDER_SITE_OTHER): Payer: Self-pay | Admitting: Orthopedic Surgery

## 2017-04-19 NOTE — Telephone Encounter (Signed)
Please advise..patient requesting medication for pain. Said she was unable to have MRI b/c of pain.

## 2017-04-19 NOTE — Telephone Encounter (Signed)
Patient called advised she could not get the MRI done because the pain in her knee was to bad. Patient asked if she can get some medication so that she can reschedule her MRI. The number to contact patient is [phone]

## 2017-04-19 NOTE — Telephone Encounter (Signed)
Over-the-counter medication okay until MRI scan re-scheduled

## 2017-04-19 NOTE — Telephone Encounter (Signed)
Tried to call patient. No answer. LM advising per Dr August Saucerean.

## 2017-04-22 ENCOUNTER — Other Ambulatory Visit: Payer: Medicaid Other

## 2017-05-07 ENCOUNTER — Ambulatory Visit: Admission: RE | Admit: 2017-05-07 | Payer: Medicaid Other | Source: Ambulatory Visit

## 2017-05-08 ENCOUNTER — Telehealth (INDEPENDENT_AMBULATORY_CARE_PROVIDER_SITE_OTHER): Payer: Self-pay | Admitting: Orthopedic Surgery

## 2017-05-08 NOTE — Telephone Encounter (Signed)
Please advise. Patient requesting something for pain so that she can have MRI scan. States too painful to have MRI scan.

## 2017-05-08 NOTE — Telephone Encounter (Signed)
Patient couldn't receive MRI because she was in too much pain. Could she be prescribed something for the pain so she could be able to get through the MRI. CB # [phone]

## 2017-05-16 ENCOUNTER — Telehealth (INDEPENDENT_AMBULATORY_CARE_PROVIDER_SITE_OTHER): Payer: Self-pay | Admitting: Orthopedic Surgery

## 2017-05-16 MED ORDER — TRAMADOL HCL 50 MG PO TABS: 50.0000 mg | ORAL_TABLET | Freq: Three times a day (TID) | ORAL | 0 refills | Status: DC

## 2017-05-16 NOTE — Telephone Encounter (Signed)
Patient wants something for pain, maybe Tramdol

## 2017-05-16 NOTE — Telephone Encounter (Signed)
Patient called needing some pain medication so she could be able to get through her MRI on her knee. Please give her a call when you can. Thank you. [phone]

## 2017-05-16 NOTE — Telephone Encounter (Signed)
Ok for ultram 1 po q 8 # 15

## 2017-05-16 NOTE — Telephone Encounter (Signed)
IC Rx into her pharm. IC pt and advised.

## 2017-05-16 NOTE — Addendum Note (Signed)
Addended byCherre Huger: Jehad Bisono on: 05/16/2017 05:42 PM   Modules accepted: Orders

## 2017-09-13 ENCOUNTER — Emergency Department (HOSPITAL_COMMUNITY)
Admission: EM | Admit: 2017-09-13 | Discharge: 2017-09-13 | Disposition: A | Discharge status: Home / Self Care | Payer: Medicaid Other | Attending: Emergency Medicine | Admitting: Emergency Medicine

## 2017-09-13 ENCOUNTER — Encounter (HOSPITAL_COMMUNITY): Payer: Self-pay

## 2017-09-13 DIAGNOSIS — R21 Rash and other nonspecific skin eruption: Secondary | ICD-10-CM | POA: Diagnosis present

## 2017-09-13 DIAGNOSIS — Z79899 Other long term (current) drug therapy: Secondary | ICD-10-CM | POA: Insufficient documentation

## 2017-09-13 DIAGNOSIS — L304 Erythema intertrigo: Secondary | ICD-10-CM | POA: Diagnosis not present

## 2017-09-13 DIAGNOSIS — J45909 Unspecified asthma, uncomplicated: Secondary | ICD-10-CM | POA: Insufficient documentation

## 2017-09-13 MED ORDER — HYDROXYZINE HCL 25 MG PO TABS: 25.0000 mg | ORAL_TABLET | Freq: Four times a day (QID) | ORAL | 0 refills | Status: DC

## 2017-09-13 MED ORDER — TERBINAFINE HCL 1 % EX CREA: 1.0000 "application " | TOPICAL_CREAM | Freq: Two times a day (BID) | CUTANEOUS | 0 refills | Status: DC

## 2017-09-13 MED ORDER — PREDNISONE 20 MG PO TABS: ORAL_TABLET | ORAL | 0 refills | Status: DC

## 2017-09-13 NOTE — ED Provider Notes (Signed)
[facility]-EMERGENCY DEPT Provider Note   CSN: [id number] Arrival date & time: 09/13/17  1614     History   Chief Complaint Chief Complaint  Patient presents with  . Rash    HPI Elaine Marquez is a 46 y.o. female.  The history is provided by the patient. No language interpreter was used.  Rash   This is a recurrent problem. The current episode started more than 1 week ago. The problem has been gradually worsening. The problem is associated with an unknown factor. The rash is present on the groin. Associated symptoms include itching. Treatments tried: anitibiotic (doxycycline)    Past Medical History:  Diagnosis Date  . Asthma   . Peritonsillar abscess     There are no active problems to display for this patient.   History reviewed. No pertinent surgical history.  OB History    No data available       Home Medications    Prior to Admission medications   Medication Sig Start Date End Date Taking? Authorizing Provider  albuterol (PROVENTIL) (2.5 MG/3ML) 0.083% nebulizer solution Take 3 mLs (2.5 mg total) by nebulization every 6 (six) hours as needed for wheezing. 08/11/13   Antony Madura, PA-C  benzonatate (TESSALON) 100 MG capsule Take 1 capsule (100 mg total) by mouth every 8 (eight) hours. 11/30/14   Eber Hong, MD  celecoxib (CELEBREX) 100 MG capsule Take 1 capsule (100 mg total) by mouth 2 (two) times daily. 10/18/16   Andrena Mews, DO  clindamycin (CLEOCIN) 150 MG capsule Take 3 capsules (450 mg total) by mouth 3 (three) times daily. 11/29/14   Lawyer, Cristal Deer, PA-C  HYDROcodone-acetaminophen (VICODIN) 5-500 MG per tablet Take 1-2 tablets by mouth every 6 (six) hours as needed for pain. 09/02/12   Charlynne Pander, MD  HYDROcodone-homatropine [facility]) 5-1.5 MG/5ML syrup Take 5 mLs by mouth every 6 (six) hours as needed for cough. 11/29/14   Lawyer, Cristal Deer, PA-C  methocarbamol (ROBAXIN) 500 MG tablet Take 1 tablet (500 mg total) by mouth every 8  (eight) hours as needed for muscle spasms. 03/29/17   Cammy Copa, MD  predniSONE (DELTASONE) 20 MG tablet Take 2 tablets (40 mg total) by mouth daily. 08/11/13   Antony Madura, PA-C  traMADol (ULTRAM) 50 MG tablet Take 1 tablet (50 mg total) by mouth every 8 (eight) hours. 05/16/17   Cammy Copa, MD    Family History History reviewed. No pertinent family history.  Social History Social History  Substance Use Topics  . Smoking status: Never Smoker  . Smokeless tobacco: Never Used  . Alcohol use No     Allergies   Ibuprofen   Review of Systems Review of Systems  Skin: Positive for itching and rash.  All other systems reviewed and are negative.    Physical Exam Updated Vital Signs BP (!) 136/97 (BP Location: Left Arm)   Pulse (!) 112   Temp 98.7 F (37.1 C) (Oral)   Resp 20   Ht  (1.626 m)   Wt 102.1 kg (225 lb)   SpO2 98%   BMI 38.62 kg/m   Physical Exam  Constitutional: She appears well-developed and well-nourished. No distress.  HENT:  Head: Normocephalic and atraumatic.  Eyes: Conjunctivae are normal.  Neck: Neck supple.  Cardiovascular: Normal rate and regular rhythm.   No murmur heard. Pulmonary/Chest: Effort normal and breath sounds normal. No respiratory distress.  Abdominal: Soft.  Musculoskeletal: Normal range of motion. She exhibits no edema.  Neurological:  She is alert.  Skin: Skin is warm and dry. Rash noted. There is erythema.  Erythematous, pruritic, plaques in inguinal folds  Psychiatric: She has a normal mood and affect.  Nursing note and vitals reviewed.    ED Treatments / Results  Labs (all labs ordered are listed, but only abnormal results are displayed) Labs Reviewed - No data to display  EKG  EKG Interpretation None       Radiology No results found.  Procedures Procedures (including critical care time)  Medications Ordered in ED Medications - No data to display   Initial Impression / Assessment and  Plan / ED Course  I have reviewed the triage vital signs and the nursing notes.  Pertinent labs & imaging results that were available during my care of the patient were reviewed by me and considered in my medical decision making (see chart for details).        Patient with intertrigo. Will treat with anti-fungal medication. Pt instructed to keep the area dry. Contact precautions given. No signs of secondary infection. Follow up with PCP in 2-3 days if no improvement. Return precautions discussed. Pt is safe for discharge at this time.      Final Clinical Impressions(s) / ED Diagnoses   Final diagnoses:  Pruritic intertrigo    New Prescriptions New Prescriptions   HYDROXYZINE (ATARAX/VISTARIL) 25 MG TABLET    Take 1 tablet (25 mg total) by mouth every 6 (six) hours.   PREDNISONE (DELTASONE) 20 MG TABLET    3 tabs po day one, then 2 tabs daily x 4 days   TERBINAFINE (LAMISIL) 1 % CREAM    Apply 1 application topically 2 (two) times daily.     Felicie Morn, NP 09/13/17 1756    Pricilla Loveless, MD 09/13/17 1800

## 2017-09-13 NOTE — ED Triage Notes (Signed)
Pt presents with "months" h/o perineal rash and itching.  Pt has been seen at clinic, given doxycycline with some improvement but symptoms returned.

## 2018-03-15 DIAGNOSIS — H52223 Regular astigmatism, bilateral: Secondary | ICD-10-CM | POA: Diagnosis not present

## 2018-03-15 DIAGNOSIS — H524 Presbyopia: Secondary | ICD-10-CM | POA: Diagnosis not present

## 2018-03-15 DIAGNOSIS — H5213 Myopia, bilateral: Secondary | ICD-10-CM | POA: Diagnosis not present

## 2018-05-15 ENCOUNTER — Emergency Department (HOSPITAL_COMMUNITY)
Admission: EM | Admit: 2018-05-15 | Discharge: 2018-05-15 | Disposition: A | Discharge status: Home / Self Care | Payer: Medicaid Other | Attending: Emergency Medicine | Admitting: Emergency Medicine

## 2018-05-15 ENCOUNTER — Encounter (HOSPITAL_COMMUNITY): Payer: Self-pay | Admitting: Emergency Medicine

## 2018-05-15 ENCOUNTER — Other Ambulatory Visit: Payer: Self-pay

## 2018-05-15 DIAGNOSIS — J45909 Unspecified asthma, uncomplicated: Secondary | ICD-10-CM | POA: Insufficient documentation

## 2018-05-15 DIAGNOSIS — E119 Type 2 diabetes mellitus without complications: Secondary | ICD-10-CM

## 2018-05-15 DIAGNOSIS — I10 Essential (primary) hypertension: Secondary | ICD-10-CM | POA: Insufficient documentation

## 2018-05-15 DIAGNOSIS — R739 Hyperglycemia, unspecified: Secondary | ICD-10-CM | POA: Diagnosis present

## 2018-05-15 DIAGNOSIS — E1165 Type 2 diabetes mellitus with hyperglycemia: Secondary | ICD-10-CM | POA: Insufficient documentation

## 2018-05-15 HISTORY — DX: Essential (primary) hypertension: I10

## 2018-05-15 HISTORY — DX: Type 2 diabetes mellitus without complications: E11.9

## 2018-05-15 LAB — URINALYSIS, ROUTINE W REFLEX MICROSCOPIC
Bacteria, UA: NONE SEEN
Bilirubin Urine: NEGATIVE
Glucose, UA: >=500 mg/dL — AB
Hgb urine dipstick: NEGATIVE
Ketones, ur: NEGATIVE mg/dL
Nitrite: NEGATIVE
PH: 5 (ref 5.0–8.0)
Protein, ur: NEGATIVE mg/dL
Specific Gravity, Urine: 1.017 (ref 1.005–1.030)

## 2018-05-15 LAB — CBC
HCT: 41.2 % (ref 36.0–46.0)
Hemoglobin: 13.8 g/dL (ref 12.0–15.0)
MCH: 29.9 pg (ref 26.0–34.0)
MCHC: 33.5 g/dL (ref 30.0–36.0)
MCV: 89.2 fL (ref 78.0–100.0)
PLATELETS: 382 10*3/uL (ref 150–400)
RBC: 4.62 MIL/uL (ref 3.87–5.11)
RDW: 13 % (ref 11.5–15.5)
WBC: 9 10*3/uL (ref 4.0–10.5)

## 2018-05-15 LAB — BASIC METABOLIC PANEL
Anion gap: 9 (ref 5–15)
BUN: 8 mg/dL (ref 6–20)
CALCIUM: 9.5 mg/dL (ref 8.9–10.3)
CO2: 22 mmol/L (ref 22–32)
Chloride: 103 mmol/L (ref 101–111)
Creatinine, Ser: 0.56 mg/dL (ref 0.44–1.00)
GFR calc non Af Amer: >60 mL/min (ref >60)
Glucose, Bld: 322 mg/dL — ABNORMAL HIGH (ref 65–99)
Potassium: 4.1 mmol/L (ref 3.5–5.1)
Sodium: 134 mmol/L — ABNORMAL LOW (ref 135–145)

## 2018-05-15 LAB — CBG MONITORING, ED: Glucose-Capillary: 307 mg/dL — ABNORMAL HIGH (ref 65–99)

## 2018-05-15 LAB — I-STAT BETA HCG BLOOD, ED (MC, WL, AP ONLY)

## 2018-05-15 MED ORDER — ACCU-CHEK AVIVA DEVI: 0 refills | Status: AC

## 2018-05-15 MED ORDER — ACCU-CHEK FASTCLIX LANCET KIT: PACK | 2 refills | Status: DC

## 2018-05-15 MED ORDER — METFORMIN HCL 500 MG PO TABS: 500.0000 mg | ORAL_TABLET | Freq: Two times a day (BID) | ORAL | 0 refills | Status: DC

## 2018-05-15 MED ORDER — GLUCOSE BLOOD VI STRP: ORAL_STRIP | 12 refills | Status: DC

## 2018-05-15 NOTE — ED Provider Notes (Signed)
[facility] EMERGENCY DEPARTMENT Provider Note  CSN: [id number] Arrival date & time: 05/15/18  5465  History   Chief Complaint Chief Complaint  Patient presents with  . Hyperglycemia   HPI Elaine Marquez is a 47 y.o. female with a medical history of asthma, Type 2 DM and HTN who presented to the ED for hyperglycemia. Patient states that she was at a health [place] at a church yesterday and her blood glucose was 427. A couple of weeks ago she had it checked and it was 320. Patient came to the ED discuss diabetes because she does not have a PCP. She states she took Metformin in the past because her former PCP told her she had diabetes, but stopped the taking medication without reason.  Associated symptoms: fatigue, increased urination and thirst. Denies paresthesias, vision changes, headaches, urinary difficulties, skin changes or neuro complaints. Patient took Metformin from an old Rx prior to coming to the ED.  Past Medical History:  Diagnosis Date  . Asthma   . Diabetes mellitus without complication ([place])   . Hypertension   . Peritonsillar abscess    There are no active problems to display for this patient.  No past surgical history on file.   OB History   None     Home Medications    Prior to Admission medications   Medication Sig Start Date End Date Taking? Authorizing Provider  albuterol (PROVENTIL) (2.5 MG/3ML) 0.083% nebulizer solution Take 3 mLs (2.5 mg total) by nebulization every 6 (six) hours as needed for wheezing. 08/11/13   Antonietta Breach, PA-C  benzonatate (TESSALON) 100 MG capsule Take 1 capsule (100 mg total) by mouth every 8 (eight) hours. 11/30/14   Noemi Chapel, MD  celecoxib (CELEBREX) 100 MG capsule Take 1 capsule (100 mg total) by mouth 2 (two) times daily. 10/18/16   Gerda Diss, DO  clindamycin (CLEOCIN) 150 MG capsule Take 3 capsules (450 mg total) by mouth 3 (three) times daily. 11/29/14   Lawyer, Harrell Gave, PA-C    HYDROcodone-acetaminophen (VICODIN) 5-500 MG per tablet Take 1-2 tablets by mouth every 6 (six) hours as needed for pain. 09/02/12   Drenda Freeze, MD  HYDROcodone-homatropine [facility]) 5-1.5 MG/5ML syrup Take 5 mLs by mouth every 6 (six) hours as needed for cough. 11/29/14   Lawyer, Harrell Gave, PA-C  hydrOXYzine (ATARAX/VISTARIL) 25 MG tablet Take 1 tablet (25 mg total) by mouth every 6 (six) hours. 09/13/17   Etta Quill, NP  methocarbamol (ROBAXIN) 500 MG tablet Take 1 tablet (500 mg total) by mouth every 8 (eight) hours as needed for muscle spasms. 03/29/17   Meredith Pel, MD  predniSONE (DELTASONE) 20 MG tablet Take 2 tablets (40 mg total) by mouth daily. 08/11/13   Antonietta Breach, PA-C  predniSONE (DELTASONE) 20 MG tablet 3 tabs po day one, then 2 tabs daily x 4 days 09/13/17   Etta Quill, NP  terbinafine (LAMISIL) 1 % cream Apply 1 application topically 2 (two) times daily. 09/13/17   Etta Quill, NP  traMADol (ULTRAM) 50 MG tablet Take 1 tablet (50 mg total) by mouth every 8 (eight) hours. 05/16/17   Meredith Pel, MD    Family History No family history on file.  Social History Social History   Tobacco Use  . Smoking status: Never Smoker  . Smokeless tobacco: Never Used  Substance Use Topics  . Alcohol use: No  . Drug use: No     Allergies   Ibuprofen   Review  of Systems Review of Systems  Constitutional: Positive for fatigue. Negative for activity change, appetite change and unexpected weight change.  Eyes: Negative for visual disturbance.  Respiratory: Negative.   Cardiovascular: Negative.   Gastrointestinal: Negative.   Endocrine: Positive for polydipsia and polyuria.  Skin: Negative.  Negative for color change and pallor.  Neurological: Negative.  Negative for dizziness, light-headedness, numbness and headaches.     Physical Exam Updated Vital Signs BP (!) 127/96   Pulse 73   Temp 98.5 F (36.9 C) (Oral)   Resp 18   Ht 5' 4" (1.626 m)   Wt  104.3 kg (230 lb)   SpO2 93%   BMI 39.48 kg/m   Physical Exam  Constitutional: Vital signs are normal. She appears well-nourished. She is cooperative. She does not appear ill. No distress.  Overweight. Sitting comfortably.  Eyes: Pupils are equal, round, and reactive to light. Conjunctivae, EOM and lids are normal.  Fundoscopic exam:      The right eye shows no AV nicking, no exudate and no hemorrhage.       The left eye shows no AV nicking, no exudate and no hemorrhage.  Cardiovascular: Normal rate, regular rhythm, normal heart sounds and intact distal pulses.  Pulmonary/Chest: Effort normal and breath sounds normal.  Neurological: She is alert.  Skin: Skin is warm and dry.     ED Treatments / Results  Labs (all labs ordered are listed, but only abnormal results are displayed) Labs Reviewed  BASIC METABOLIC PANEL - Abnormal; Notable for the following components:      Result Value   Sodium 134 (*)    Glucose, Bld 322 (*)    All other components within normal limits  URINALYSIS, ROUTINE W REFLEX MICROSCOPIC - Abnormal; Notable for the following components:   Color, Urine STRAW (*)    Glucose, UA >=500 (*)    Leukocytes, UA TRACE (*)    All other components within normal limits  CBG MONITORING, ED - Abnormal; Notable for the following components:   Glucose-Capillary 307 (*)    All other components within normal limits  CBC  I-STAT BETA HCG BLOOD, ED ([facility], [facility], [facility] ONLY)    EKG None  Radiology No results found.  Procedures Procedures (including critical care time)  Medications Ordered in ED Medications - No data to display   Initial Impression / Assessment and Plan / ED Course  Triage vital signs and the nursing notes have been reviewed.  Pertinent labs & imaging results that were available during care of the patient were reviewed and considered in medical decision making (see chart for details). Clinical Course as of May 15 1401  Wed May 15, 2018  1345 Labs  consistent with hyperglycemia. No evidence of end-organ damage on physical exam or labs that require further evaluation in the ED.   [GM]    Clinical Course User Index [GM] [place], Jonelle Sports, [place]   Patient presents in no acute distress and has no specific complaints today, but wants to discuss diabetes. Thorough education was provided on Type 2 DM management, treatment and appropriate follow-up.  Final Clinical Impressions(s) / ED Diagnoses  1. Type 2 Diabetes Mellitus. Rx for Metformin 524m BID. Rx for blood glucose kit so patient can test at home. Advised patient to follow-up with PCP for further management. Education provided on hyperglycemia, hypoglycemia and long-term complications of untreated hyperglycemia/Type 2 DM.  Dispo: Home. After thorough clinical evaluation, this patient is determined to be medically stable and can  be safely discharged with the previously mentioned treatment and/or outpatient follow-up/referral(s). At this time, there are no other apparent medical conditions that require further screening, evaluation or treatment.  Final diagnoses:  None    ED Discharge Orders    None        Junita Push 05/15/18 1433    Noemi Chapel, MD 05/20/18 [phone]

## 2018-05-15 NOTE — ED Notes (Signed)
Spoke with patient regarding her to use bathroom to obtain urine specimen. Patient will attempt to provide sample.

## 2018-05-15 NOTE — Discharge Instructions (Signed)
Establish care with a primary care provider ASAP!  Check blood sugar at least 3x per day: in the morning before breakfast, 2 hours before lunch/dinner and before bedtime.

## 2018-05-15 NOTE — ED Triage Notes (Signed)
Patient presents to the ED with complaints of High blood sugar and was 427 last night at 1900. Patient reports does not check blood sugars at home and does not take diabetes medications. Patient reports she goes to a community care clinic and and seen blood sugars are increasing,

## 2018-09-12 ENCOUNTER — Ambulatory Visit: Payer: Medicaid Other | Attending: Family Medicine | Admitting: Family Medicine

## 2018-09-12 ENCOUNTER — Encounter: Payer: Self-pay | Admitting: Family Medicine

## 2018-09-12 VITALS — BP 114/81 | HR 94 | Temp 99.2°F | Resp 16 | Ht 64.5 in | Wt 241.4 lb

## 2018-09-12 DIAGNOSIS — J4520 Mild intermittent asthma, uncomplicated: Secondary | ICD-10-CM | POA: Diagnosis not present

## 2018-09-12 DIAGNOSIS — E1165 Type 2 diabetes mellitus with hyperglycemia: Secondary | ICD-10-CM | POA: Diagnosis not present

## 2018-09-12 DIAGNOSIS — I10 Essential (primary) hypertension: Secondary | ICD-10-CM

## 2018-09-12 DIAGNOSIS — E119 Type 2 diabetes mellitus without complications: Secondary | ICD-10-CM | POA: Diagnosis not present

## 2018-09-12 DIAGNOSIS — Z794 Long term (current) use of insulin: Secondary | ICD-10-CM | POA: Insufficient documentation

## 2018-09-12 DIAGNOSIS — Z886 Allergy status to analgesic agent status: Secondary | ICD-10-CM | POA: Insufficient documentation

## 2018-09-12 LAB — POCT URINALYSIS DIP (CLINITEK)
Bilirubin, UA: NEGATIVE
Blood, UA: NEGATIVE
Glucose, UA: 500 mg/dL — AB
Ketones, POC UA: NEGATIVE mg/dL
Leukocytes, UA: NEGATIVE
Nitrite, UA: NEGATIVE
POC,PROTEIN,UA: NEGATIVE
Spec Grav, UA: <=1.005 — AB
Urobilinogen, UA: 0.2 U/dL
pH, UA: 5.5

## 2018-09-12 LAB — POCT GLYCOSYLATED HEMOGLOBIN (HGB A1C): HbA1c, POC (controlled diabetic range): 12.2 % — AB (ref 0.0–7.0)

## 2018-09-12 LAB — GLUCOSE, POCT (MANUAL RESULT ENTRY)
POC Glucose: 498 mg/dL — AB (ref 70–99)
POC Glucose: 373 mg/dL — AB (ref 70–99)
POC Glucose: 382 mg/dL — AB (ref 70–99)

## 2018-09-12 MED ORDER — METFORMIN HCL 500 MG PO TABS: 500.0000 mg | ORAL_TABLET | Freq: Two times a day (BID) | ORAL | 3 refills | Status: DC

## 2018-09-12 MED ORDER — INSULIN ASPART 100 UNIT/ML ~~LOC~~ SOLN: 10.0000 [IU] | Freq: Once | SUBCUTANEOUS | Status: DC

## 2018-09-12 MED ORDER — GLIMEPIRIDE 4 MG PO TABS: 4.0000 mg | ORAL_TABLET | Freq: Every day | ORAL | 3 refills | Status: DC

## 2018-09-12 MED ORDER — ACCU-CHEK FASTCLIX LANCET KIT: PACK | 6 refills | Status: AC

## 2018-09-12 MED ORDER — GLUCOSE BLOOD VI STRP: ORAL_STRIP | 6 refills | Status: DC

## 2018-09-12 MED ORDER — ALBUTEROL SULFATE HFA 108 (90 BASE) MCG/ACT IN AERS: 2.0000 | INHALATION_SPRAY | Freq: Four times a day (QID) | RESPIRATORY_TRACT | 6 refills | Status: DC | PRN

## 2018-09-12 MED ORDER — INSULIN ASPART 100 UNIT/ML ~~LOC~~ SOLN
10.0000 [IU] | Freq: Once | SUBCUTANEOUS | Status: AC
  Administered 2018-09-12: 10 [IU] via SUBCUTANEOUS

## 2018-09-12 NOTE — Progress Notes (Signed)
Subjective:    Patient ID: Elaine Marquez, female    DOB: 08-Nov-1971, 47 y.o.   MRN: [id number]  HPI 47 year old female new to the practice.  Patient is here to establish care for chronic medical conditions including mild intermittent asthma, type 2 diabetes and hypertension.  Patient states that she has been on metformin for her diabetes at 500 mg twice daily.  Patient states that she recently ran out of the medication.  Patient states that she checked her blood sugar yesterday and her blood sugar was 285 nonfasting.  Patient states that her blood sugars generally run in the 200s.  Patient has had some increased thirst and some mild urinary frequency.  Patient denies any blurred vision and no numbness in the hands or feet.  Patient states that her blood pressure has been controlled on atenolol 25 mg once daily.  Patient states that her asthma is triggered by pollen and by changes in the weather.  Patient does need a refill of her albuterol inhaler.  Patient has occasionally felt a little short of breath over the past few days.  No wheezing.      Patient reports that she does not smoke, patient drinks alcohol about once per week.  Patient is married and her husband and youngest daughter are with her at today's visit.  Patient states that they have 8 children.  Surgical history is significant for tubal ligation.  Patient reports family history of mother and father with diabetes and hypertension and a brother who has had a CVA. Past Medical History:  Diagnosis Date  . Asthma   . Diabetes mellitus without complication ([place])   . Hypertension   . Peritonsillar abscess    Allergies  Allergen Reactions  . Ibuprofen Nausea And Vomiting    Review of Systems  Constitutional: Positive for fatigue. Negative for chills and unexpected weight change.  HENT: Negative for sore throat and trouble swallowing.   Respiratory: Negative for cough and shortness of breath.   Cardiovascular: Negative for chest  pain, palpitations and leg swelling.  Gastrointestinal: Negative for abdominal pain and nausea.  Endocrine: Positive for polydipsia, polyphagia and polyuria.  Genitourinary: Positive for frequency. Negative for dysuria.  Musculoskeletal: Negative for back pain and joint swelling.  Neurological: Positive for light-headedness. Negative for dizziness, numbness and headaches.       Objective:   Physical Exam BP 114/81   Pulse 94   Temp 99.2 F (37.3 C) (Oral)   Resp 16   Ht 5' 4.5" (1.638 m)   Wt 241 lb 6.4 oz (109.5 kg)   SpO2 96%   BMI 40.80 kg/m  Nurse's notes and vital signs reviewed General- well-nourished, well-developed overweight female in no acute distress sitting on the exam table ENT-TMs gray, nares with mild edema, mild posterior pharynx that is slightly narrowed Neck-supple, no cervical lymphadenopathy, no carotid bruit Lungs-clear to auscultation bilaterally Cardiovascular-regular rate and rhythm Abdomen-soft, nontender Back-no CVA tenderness Extremities-no edema Diabetic foot exam- patient with no active skin breakdown on the feet, patient does have dry skin on the heels.  Patient does have mild hammertoe deformities bilaterally.  Patient with normal monofilament exam in 10 out of 10 areas tested.  Patient with 1+ dorsalis pedis and posterior tibial pulses      Assessment & Plan:  1. Type 2 diabetes mellitus without complication, without long-term current use of insulin ([place]) Patient reports that she has been out of her metformin but on review of chart, patient has not had  medical attention since an emergency department visit in May.  At today's visit, patient states that after she ran out of the medication prescribed in the emergency department, she had metformin from 2018 that she started taking.  Patient reports that she has never taken insulin other than receiving an injection here at today's visit due to her elevated blood sugar.  If possible, patient wishes to try  oral medications and not be on insulin.  Patient's blood sugar was 498 on arrival to her visit.  Patient states that she had eaten just prior to coming into the office.  Patient was given 10 units of regular insulin with reduction of blood sugar to 382.  Patient also had urinalysis done which did not show the presence of ketones.  Patient is given refills for her metformin.  Dose of metformin is not increased as patient reports that she has loose stools and stomach upset with her current metformin dose.  Patient will have Amaryl added to take each morning but because her blood sugars were still elevated today, patient may take a dose of metformin and half of the 4 mg dose of Amaryl tonight when she gets home before dinner. - POCT glucose (manual entry) - POCT glycosylated hemoglobin (Hb A1C) - Microalbumin / creatinine urine ratio - insulin aspart (novoLOG) injection 10 Units - glucose blood (ACCU-CHEK AVIVA) test strip; Use as instructed  Dispense: 100 each; Refill: 6 - Lancets Misc. (ACCU-CHEK FASTCLIX LANCET) KIT; Check blood glucose at least 3 times daily.  Dispense: 1 kit; Refill: 6 - POCT glucose (manual entry) - POCT glucose (manual entry)  2. Uncontrolled type 2 diabetes mellitus with hyperglycemia [facility]) Patient with blood sugar of 498 at today's visit and patient reports that she is recently ran out of metformin and has actually been taking prior to that, metformin that was left over from 2018.  Patient was given 10 units of regular insulin here in the office with reduction of blood sugar to 382.  Patient does not wish to be on insulin and states that when she is taking the metformin, her blood sugars are usually in the 200s.  Amaryl 4 mg will be added to patient's current regimen.  Patient is to take 2 mg of the Amaryl, half a pill when she arrives home today as well as her metformin prior to eating her evening meal today.  Patient will then start Amaryl 4 mg before the first meal of the day and  patient may either take metformin twice daily or take 2 pills before or after her evening meals which ever regimen works best to decrease GI symptoms associated with her use of metformin.  Patient is also to start monitoring her blood sugars and to return to clinic in the next 2 weeks and bring her monitor and blood sugar diary for review of her diabetes with the clinical pharmacist.  Patient was given information on diabetes and carbohydrate counting.  Patient will have lab work at today's visit including CMP and TSH.  Patient's urinalysis did not show the presence of ketones. - POCT URINALYSIS DIP (CLINITEK) - Comprehensive metabolic panel - TSH - insulin aspart (novoLOG) injection 10 Units - metFORMIN (GLUCOPHAGE) 500 MG tablet; Take 1 tablet (500 mg total) by mouth 2 (two) times daily with a meal.  Dispense: 60 tablet; Refill: 3 - glimepiride (AMARYL) 4 MG tablet; Take 1 tablet (4 mg total) by mouth daily before breakfast.  Dispense: 30 tablet; Refill: 3 - albuterol (PROVENTIL HFA;VENTOLIN HFA) 108 (  90 Base) MCG/ACT inhaler; Inhale 2 puffs into the lungs every 6 (six) hours as needed for wheezing or shortness of breath.  Dispense: 1 Inhaler; Refill: 6  3. Mild intermittent asthma without complication Patient reports a history of mild intermittent asthma.  Patient is provided with refill of albuterol inhaler to use as needed - albuterol (PROVENTIL HFA;VENTOLIN HFA) 108 (90 Base) MCG/ACT inhaler; Inhale 2 puffs into the lungs every 6 (six) hours as needed for wheezing or shortness of breath.  Dispense: 1 Inhaler; Refill: 6  4. Essential hypertension Patient reports a history of hypertension but patient's blood pressure is normal at today's visit.  Patient reports that she has taken atenolol 25 mg with good success.  This medication is not listed on her list of medicines and she does not have the medication bottle with her at today's visit.  Patient is having a urine microalbumin done at today's visit  and will likely be started on a low dose ACE inhibitor at her next visit depending on the results of her urine microalbumin and renal function/creatinine/GFR.  *Patient was offered but declined influenza immunization at today's visit  An After Visit Summary was printed and given to the patient.  Return for DM- 2 weeks with Lurena Joiner; 4 weeks with PCP.

## 2018-09-12 NOTE — Patient Instructions (Signed)
Blood Glucose Monitoring, Adult °Monitoring your blood sugar (glucose) helps you manage your diabetes. It also helps you and your health care provider determine how well your diabetes management plan is working. Blood glucose monitoring involves checking your blood glucose as often as directed, and keeping a record (log) of your results over time. °Why should I monitor my blood glucose? °Checking your blood glucose regularly can: °· Help you understand how food, exercise, illnesses, and medicines affect your blood glucose. °· Let you know what your blood glucose is at any time. You can quickly tell if you are having low blood glucose (hypoglycemia) or high blood glucose (hyperglycemia). °· Help you and your health care provider adjust your medicines as needed. ° °When should I check my blood glucose? °Follow instructions from your health care provider about how often to check your blood glucose. This may depend on: °· The type of diabetes you have. °· How well-controlled your diabetes is. °· Medicines you are taking. ° °If you have type 1 diabetes: °· Check your blood glucose at least 2 times a day. °· Also check your blood glucose: °? Before every insulin injection. °? Before and after exercise. °? Between meals. °? 2 hours after a meal. °? Occasionally between 2:00 a.m. and 3:00 a.m., as directed. °? Before potentially dangerous tasks, like driving or using heavy machinery. °? At bedtime. °· You may need to check your blood glucose more often, up to 6-10 times a day: °? If you use an insulin pump. °? If you need multiple daily injections (MDI). °? If your diabetes is not well-controlled. °? If you are ill. °? If you have a history of severe hypoglycemia. °? If you have a history of not knowing when your blood glucose is getting low (hypoglycemia unawareness). °If you have type 2 diabetes: °· If you take insulin or other diabetes medicines, check your blood glucose at least 2 times a day. °· If you are on intensive  insulin therapy, check your blood glucose at least 4 times a day. Occasionally, you may also need to check between 2:00 a.m. and 3:00 a.m., as directed. °· Also check your blood glucose: °? Before and after exercise. °? Before potentially dangerous tasks, like driving or using heavy machinery. °· You may need to check your blood glucose more often if: °? Your medicine is being adjusted. °? Your diabetes is not well-controlled. °? You are ill. °What is a blood glucose log? °· A blood glucose log is a record of your blood glucose readings. It helps you and your health care provider: °? Look for patterns in your blood glucose over time. °? Adjust your diabetes management plan as needed. °· Every time you check your blood glucose, write down your result and notes about things that may be affecting your blood glucose, such as your diet and exercise for the day. °· Most glucose meters store a record of glucose readings in the meter. Some meters allow you to download your records to a computer. °How do I check my blood glucose? °Follow these steps to get accurate readings of your blood glucose: °Supplies needed ° °· Blood glucose meter. °· Test strips for your meter. Each meter has its own strips. You must use the strips that come with your meter. °· A needle to prick your finger (lancet). Do not use lancets more than once. °· A device that holds the lancet (lancing device). °· A journal or log book to write down your results. °Procedure °·   Wash your hands with soap and water. °· Prick the side of your finger (not the tip) with the lancet. Use a different finger each time. °· Gently rub the finger until a small drop of blood appears. °· Follow instructions that come with your meter for inserting the test strip, applying blood to the strip, and using your blood glucose meter. °· Write down your result and any notes. °Alternative testing sites °· Some meters allow you to use areas of your body other than your finger  (alternative sites) to test your blood. °· If you think you may have hypoglycemia, or if you have hypoglycemia unawareness, do not use alternative sites. Use your finger instead. °· Alternative sites may not be as accurate as the fingers, because blood flow is slower in these areas. This means that the result you get may be delayed, and it may be different from the result that you would get from your finger. °· The most common alternative sites are: °? Forearm. °? Thigh. °? Palm of the hand. °Additional tips °· Always keep your supplies with you. °· If you have questions or need help, all blood glucose meters have a 24-hour “hotline” number that you can call. You may also contact your health care provider. °· After you use a few boxes of test strips, adjust (calibrate) your blood glucose meter by following instructions that came with your meter. °This information is not intended to replace advice given to you by your health care provider. Make sure you discuss any questions you have with your health care provider. °Document Released: 12/07/2003 Document Revised: 06/23/2016 Document Reviewed: 05/15/2016 °Elsevier Interactive Patient Education © 2017 [employer] ° ° °Coping With Diabetes °Diabetes (type 1 diabetes mellitus or type 2 diabetes mellitus) is a condition in which the body does not have enough of a hormone called insulin, or the body does not respond properly to insulin. Normally, insulin allows sugars (glucose) to enter cells in the body. The cells use glucose for energy. With diabetes, extra glucose builds up in the blood instead of going into cells, which results in high blood glucose (hyperglycemia). °How to manage lifestyle changes °Managing diabetes includes medical treatments as well as lifestyle changes. If diabetes is not managed well, serious physical and emotional complications can occur. Taking good care of yourself means that you are responsible for: °· Monitoring glucose regularly. °· Eating  a healthy diet. °· Exercising regularly. °· Meeting with health care providers. °· Taking medicines as directed. ° °Some people may feel a lot of stress about managing their diabetes. This is known as emotional distress, and it is very common. Living with diabetes can place you at risk for emotional distress, depression, or anxiety. These disorders can be confusing and can make diabetes management more difficult. °How to recognize stress °Emotional distress °Symptoms of emotional distress include: °· Anger about having a diagnosis of diabetes. °· Fear or frustration about your diagnosis and the changes you need to make to manage the condition. °· Being overly worried about the care that you need or the cost of the care you need. °· Feeling like you caused your condition by doing something wrong. °· Fear of unpredictable situations, like low or high blood glucose. °· Feeling judged by your health care providers. °· Feeling very alone with the disease. °· Getting too tired or "burned out" with the demands of daily care. ° °Depression °Having diabetes means that you are at a higher risk for depression. Having depression also means   that you are at a higher risk for diabetes. Your health care provider may test (screen) you for symptoms of depression. It is important to recognize depression symptoms and to start treatment for it soon after it is diagnosed. The following are some symptoms of depression: °· Loss of interest in things that you used to enjoy. °· Trouble sleeping, or often waking up early and not being able to get back to sleep. °· A change in appetite. °· Feeling tired most of the day. °· Feeling nervous and anxious. °· Feeling guilty and worrying that you are a burden to others. °· Feeling depressed more often than you do not feel that way. °· Thoughts of hurting yourself or feeling that you want to die. ° °If you have any of these symptoms for 2 weeks or longer, reach out to a health care provider. °Where  to find support °· Ask your health care provider to recommend a therapist who understands both depression and diabetes. °· Search for information and support from the American Diabetes Association: www.diabetes.org °· Find a certified diabetes educator and make an appointment through American Association of Diabetes Educators: www.diabeteseducator.org °Follow these instructions at home: °Managing emotional distress °The following are some ways to manage emotional distress: °· Talk with your health care provider or certified diabetes educator. Consider working with a counselor or therapist. °· Learn as much as you can about diabetes and its treatment. Meet with a certified diabetes educator or take a class to learn how to manage your condition. °· Keep a journal of your thoughts and concerns. °· Accept that some things are out of your control. °· Talk with other people who have diabetes. It can help to talk with others about the emotional distress that you feel. °· Find ways to manage stress that work for you. These may include art or music therapy, exercise, meditation, and hobbies. °· Seek support from spiritual leaders, family, and friends. ° °General instructions °· Follow your diabetes management plan. °· Keep all follow-up visits as told by your health care provider. This is important. °Get help right away if: °· You have thoughts about hurting yourself or others. °If you ever feel like you may hurt yourself or others, or have thoughts about taking your own life, get help right away. You can go to your nearest emergency department or call: °· Your local emergency services (911 in the [place] °· A suicide crisis helpline, such as the National Suicide Prevention Lifeline at 1-800-273-8255. This is open 24 hours a day. ° °Summary °· Diabetes (type 1 diabetes mellitus or type 2 diabetes mellitus) is a condition in which the body does not have enough of a hormone called insulin, or the body does not respond properly  to insulin. °· Living with diabetes puts you at risk for medical issues, and it also puts you at risk for emotional issues such as emotional distress, depression, and anxiety. °· Recognizing the symptoms of emotional distress and depression may help you avoid problems with your diabetes control. It is important to start treatment for emotional distress and depression soon after they are diagnosed. °· Having diabetes means that you are at a higher risk for depression. Ask your health care provider to recommend a therapist who understands both depression and diabetes. °· If you experience symptoms of emotional distress or depression, it is important to discuss this with your health care provider, certified diabetes educator, or therapist. °This information is not intended to replace advice given to you by   your health care provider. Make sure you discuss any questions you have with your health care provider. °Document Released: 04/19/2017 Document Revised: 04/19/2017 Document Reviewed: 04/19/2017 °Elsevier Interactive Patient Education © 2018 Elsevier Inc. ° °

## 2018-09-13 LAB — COMPREHENSIVE METABOLIC PANEL WITH GFR
ALT: 12 IU/L (ref 0–32)
AST: 13 IU/L (ref 0–40)
Albumin/Globulin Ratio: 1.6 (ref 1.2–2.2)
Albumin: 4.6 g/dL (ref 3.5–5.5)
Alkaline Phosphatase: 34 IU/L — ABNORMAL LOW (ref 39–117)
BUN/Creatinine Ratio: 22 (ref 9–23)
BUN: 14 mg/dL (ref 6–24)
Bilirubin Total: 0.3 mg/dL (ref 0.0–1.2)
CO2: 22 mmol/L (ref 20–29)
Calcium: 9.9 mg/dL (ref 8.7–10.2)
Chloride: 91 mmol/L — ABNORMAL LOW (ref 96–106)
Creatinine, Ser: 0.65 mg/dL (ref 0.57–1.00)
GFR calc Af Amer: 122 mL/min/1.73
GFR calc non Af Amer: 106 mL/min/1.73
Globulin, Total: 2.9 g/dL (ref 1.5–4.5)
Glucose: 373 mg/dL — ABNORMAL HIGH (ref 65–99)
Potassium: 4.7 mmol/L (ref 3.5–5.2)
Sodium: 132 mmol/L — ABNORMAL LOW (ref 134–144)
Total Protein: 7.5 g/dL (ref 6.0–8.5)

## 2018-09-13 LAB — MICROALBUMIN / CREATININE URINE RATIO
Creatinine, Urine: 26.1 mg/dL
Microalb/Creat Ratio: <11.5 mg/g{creat} (ref 0.0–30.0)
Microalbumin, Urine: <3 ug/mL

## 2018-09-13 LAB — TSH: TSH: 1.62 u[IU]/mL (ref 0.450–4.500)

## 2018-09-16 ENCOUNTER — Telehealth: Payer: Self-pay | Admitting: *Deleted

## 2018-09-16 NOTE — Telephone Encounter (Signed)
-----   Message from Cain Saupe, MD sent at 09/13/2018  8:26 PM EDT ----- Normal urine microalbumin.  Blood sugar of 373 on BMP and creatinine is within normal.  TSH normal.

## 2018-09-16 NOTE — Telephone Encounter (Signed)
Patient verified DOB Patient is aware of TSH and creatinine being normal. Patient is aware of taking medications for DM and states she has been in the 200's lately.  Patient is aware of 10/11 and 11/8 appointments with CPP and PCP. No further questions.

## 2018-09-27 ENCOUNTER — Ambulatory Visit: Payer: Medicaid Other | Attending: Family Medicine | Admitting: Pharmacist

## 2018-09-27 ENCOUNTER — Encounter: Payer: Self-pay | Admitting: Pharmacist

## 2018-09-27 DIAGNOSIS — E118 Type 2 diabetes mellitus with unspecified complications: Secondary | ICD-10-CM | POA: Diagnosis not present

## 2018-09-27 DIAGNOSIS — Z7984 Long term (current) use of oral hypoglycemic drugs: Secondary | ICD-10-CM | POA: Diagnosis not present

## 2018-09-27 DIAGNOSIS — IMO0002 Reserved for concepts with insufficient information to code with codable children: Secondary | ICD-10-CM

## 2018-09-27 DIAGNOSIS — E1165 Type 2 diabetes mellitus with hyperglycemia: Secondary | ICD-10-CM

## 2018-09-27 DIAGNOSIS — E119 Type 2 diabetes mellitus without complications: Secondary | ICD-10-CM | POA: Diagnosis not present

## 2018-09-27 LAB — GLUCOSE, POCT (MANUAL RESULT ENTRY): POC Glucose: 165 mg/dL — AB (ref 70–99)

## 2018-09-27 MED ORDER — METFORMIN HCL ER 500 MG PO TB24: ORAL_TABLET | ORAL | 2 refills | Status: DC

## 2018-09-27 NOTE — Patient Instructions (Addendum)
Thank you for coming to see me today. Please do the following:  1. Start taking metformin XR 500 mg: 1 tablet in the morning and 2 in the evening. Take with food. 2. Continue Amaryl (glimepiride). 3. Continue checking blood sugars at home. 4. Continue making the lifestyle changes we've discussed together during our visit. Diet and exercise play a significant role in improving your blood sugars.  5. Follow-up with PCP 10/25/18.   Hypoglycemia or low blood sugar:   Low blood sugar can happen quickly and may become an emergency if not treated right away.   While this shouldn't happen often, it can be brought upon if you skip a meal or do not eat enough. Also, if your insulin or other diabetes medications are dosed too high, this can cause your blood sugar to go to low.   Warning signs of low blood sugar include: 1. Feeling shaky or dizzy 2. Feeling weak or tired  3. Excessive hunger 4. Feeling anxious or upset  5. Sweating even when you aren't exercising  What to do if I experience low blood sugar? 1. Check your blood sugar with your meter. If lower than 70, proceed to step 2.  2. Treat with 3-4 glucose tablets or 3 packets of regular sugar. If these aren't around, you can try hard candy. Yet another option would be to drink 4 ounces of fruit juice or 6 ounces of REGULAR soda.  3. Re-check your sugar in 15 minutes. If it is still below 70, do what you did in step 2 again. If has come back up, go ahead and eat a snack or small meal at this time.

## 2018-09-27 NOTE — Progress Notes (Signed)
    S:    PCP: Dr. Jillyn Hidden  No chief complaint on file.  Patient arrives in good spirits.  Presents for diabetes management at the request of Dr. Jillyn Hidden. Patient was referred on 09/12/18. Metformin 500 mg BID and glimepiride 4 mg before breakfast initiated.  Family/Social History:  - FH: no pertinent positives listed in [facility] - Tobacco: denies - Alcohol: "occasionally"  Insurance coverage/medication affordability:  - [place] Medicaid  Patient reports adherence with medications.  Current diabetes medications include:  - Metformin 500 mg BID - Glimepiride 4 mg daily before breakfast.  Patient denies hypoglycemic events.  Patient reported dietary habits: Eats 3 meals/day - Pt reports that she limits sweets and carbohydrates  - Does occasionally have small glass of soda  Patient-reported exercise habits:  - Patient reports walking daily in park   Patient denies nocturia.  Patient denies neuropathy. Patient denies visual changes. Patient reports self foot exams.   O:  Lab Results  Component Value Date   HGBA1C 12.2 (A) 09/12/2018   There were no vitals filed for this visit.  Lipid Panel  No results found for: CHOL, TRIG, HDL, CHOLHDL, VLDL, LDLCALC, LDLDIRECT  POCT glucose: 165 (non-fasting) Home fasting CBG: Reports high 100s - lower 200s.   2 hour post-prandial/random CBG: Reports 200s.   Clinical ASCVD: No  10 year ASCVD risk: unable to assess with no results from lipid panel  A/P: Diabetes longstandingcurrently uncontrolled. Patient is able to verbalize appropriate hypoglycemia management plan. Patient is adherent with medication. Had lengthy discussion concerning metformin and it's efficacy for glycemic control. Patient was unaware of XR form of metformin. After discussing decreased GI side effects associated with XR form, pt is amenable to trying 1500 mg/day.  -Increased dose of metformin to 500 mg XR: 1 tablet in the AM, 2 in the PM (1500 mg/day). -Extensively discussed  pathophysiology of DM, recommended lifestyle interventions, dietary effects on glycemic control -Counseled on s/sx of and management of hypoglycemia -Next A1C anticipated 11/2018.   ASCVD risk - primary prevention in patient with DM. Patient without lipid panel. ASCVD risk score unable to be calculated. At least moderate intensity statin indicated. Will obtain lipid panel and make provider aware for f/u in November.  -Lipid panel future   Written patient instructions provided.  Total time in face to face counseling 15 minutes.   Follow up PCP Clinic Visit in ~1 month.     Butch Penny, PharmD, CPP Clinical Pharmacist [facility] [phone]

## 2018-10-14 ENCOUNTER — Other Ambulatory Visit: Payer: Self-pay | Admitting: Internal Medicine

## 2018-10-25 ENCOUNTER — Ambulatory Visit: Payer: Medicaid Other | Admitting: Family Medicine

## 2018-10-28 ENCOUNTER — Other Ambulatory Visit: Payer: Self-pay | Admitting: Internal Medicine

## 2018-11-05 ENCOUNTER — Ambulatory Visit: Payer: Medicaid Other | Attending: Family Medicine | Admitting: Family Medicine

## 2018-11-05 VITALS — BP 133/86 | HR 90 | Temp 98.8°F | Resp 18 | Ht 64.5 in | Wt 251.0 lb

## 2018-11-05 DIAGNOSIS — I10 Essential (primary) hypertension: Secondary | ICD-10-CM | POA: Insufficient documentation

## 2018-11-05 DIAGNOSIS — M542 Cervicalgia: Secondary | ICD-10-CM | POA: Diagnosis not present

## 2018-11-05 DIAGNOSIS — E119 Type 2 diabetes mellitus without complications: Secondary | ICD-10-CM | POA: Diagnosis not present

## 2018-11-05 DIAGNOSIS — Z8249 Family history of ischemic heart disease and other diseases of the circulatory system: Secondary | ICD-10-CM | POA: Diagnosis not present

## 2018-11-05 DIAGNOSIS — Z886 Allergy status to analgesic agent status: Secondary | ICD-10-CM | POA: Diagnosis not present

## 2018-11-05 DIAGNOSIS — Z833 Family history of diabetes mellitus: Secondary | ICD-10-CM | POA: Diagnosis not present

## 2018-11-05 DIAGNOSIS — R221 Localized swelling, mass and lump, neck: Secondary | ICD-10-CM | POA: Diagnosis not present

## 2018-11-05 MED ORDER — METHOCARBAMOL 500 MG PO TABS: 500.0000 mg | ORAL_TABLET | Freq: Three times a day (TID) | ORAL | 0 refills | Status: DC | PRN

## 2018-11-05 NOTE — Progress Notes (Addendum)
Subjective:    Patient ID: Elaine Marquez, female    DOB: 31-Oct-1971, 47 y.o.   MRN: [id number]  HPI       47 year old female who returns in follow-up of type 2 diabetes patient was initially seen on 09/12/2018 and placed on metformin and glimepiride.  Patient did follow-up with the clinical pharmacist on 09/27/2018 and metformin dose was adjusted to metformin XR 1 pill in a.m. and 2 pills in the evening.  Patient's hemoglobin A1c on 09/12/2018 was elevated at 12.2.      At today's visit, patient states that her sugars are better controlled.  Patient states that her blood sugars are generally in the 140s to 160s fasting but she has had one blood sugar in the 120s.  Patient states that overall she feels okay regarding her blood sugars.  Patient does not feel that she is having any current urinary frequency, no increased thirst and no blurred vision.  Patient does have some increased hunger but overall feels better since being back on medication for control of her diabetes.      Patient with complaint of a nodule on the right side of her neck below the scalp.  Patient states that she believes that she needs to see a neurosurgeon.  Patient states that she believes that this likely a lipoma.  Patient states that her husband was treated by a neurosurgeon within the past few weeks and since the neurosurgeon took care of her husband's problem involving his neck, patient believed that she would also need to see a neurosurgeon about the nodule on her neck.  Patient also with complaint of having pain in her posterior neck.  Patient states that the pain is about 6-8 on a 0-to-10 scale.  Patient denies any radiation of the pain to either shoulder or arm but states that sometimes she feels as if the pain goes down her back a little bit with certain movements.  Patient reports that the muscles feel tight in this part of her back.  Patient states that over-the-counter and prescription pain medication so far have not  helped.  Patient also has low back pain but patient states that her low back pain " is just regular pain".   Past Medical History:  Diagnosis Date  . Asthma   . Diabetes mellitus without complication (HCC)   . Hypertension   . Peritonsillar abscess    Past Surgical History:  Procedure Laterality Date  . TUBAL LIGATION     Family History  Problem Relation Age of Onset  . Hypertension Mother   . Diabetes Mother   . Hypertension Father   . Diabetes Father   . CVA Brother    Social History   Tobacco Use  . Smoking status: Never Smoker  . Smokeless tobacco: Never Used  Substance Use Topics  . Alcohol use: No  . Drug use: No   Allergies  Allergen Reactions  . Ibuprofen Nausea And Vomiting      Review of Systems  Constitutional: Positive for fatigue. Negative for chills and fever.  Eyes: Negative for photophobia and visual disturbance.  Respiratory: Negative for cough and shortness of breath.   Cardiovascular: Negative for chest pain, palpitations and leg swelling.  Gastrointestinal: Negative for abdominal pain and nausea.  Endocrine: Positive for polyphagia. Negative for polydipsia and polyuria.  Genitourinary: Negative for dysuria and frequency.  Musculoskeletal: Positive for arthralgias, back pain, myalgias, neck pain and neck stiffness. Negative for gait problem and joint swelling.  Neurological:  Negative for dizziness and headaches.  Hematological: Negative for adenopathy. Does not bruise/bleed easily.       Objective:   Physical Exam BP 133/86 (BP Location: Left Arm, Patient Position: Sitting, Cuff Size: Large)   Pulse 90   Temp 98.8 F (37.1 C) (Oral)   Resp 18   Ht 5' 4.5" (1.638 m)   Wt 251 lb (113.9 kg)   SpO2 98%   BMI 42.42 kg/m Nurse's notes and vital signs reviewed General- well-nourished, well-developed obese female in no acute distress but patient appears slightly anxious.  Patient is accompanied by her teenage son at today's visit Neck-supple,  patient with complaint of tenderness over C4-C6 area and patient with mild cervical paraspinous spasm.  Patient with soft, compressible nodule/mass on the right at the upper neck area proximal to the scalp.  Patient also with tenderness over the right posterior occipital ridge of the scalp.  Patient does have some muscle spasm in this area as well. Lungs-clear to auscultation bilaterally Cardiovascular-regular rate and rhythm Abdomen-soft, presence of truncal obesity, nontender Back-no CVA tenderness, patient with mild bilateral lumbar paraspinous spasm Extremities-no edema Psych- normal mood other than patient appears to be anxious versus being in a hurry to leave        Assessment & Plan:  1. Type 2 diabetes mellitus without complication, without long-term current use of insulin (HCC) Patient's visit today was for lipid panel and repeat A1c however patient reports that she is not fasting and wishes to return to have both test done at once.  Patient thinks that she can come back in the next 1 to 2 weeks or perhaps even tomorrow to have her blood test done.  Patient is encouraged to continue current metformin and glipizide but depending on her A1c result, patient will be notified if further changes are needed in her regimen of diabetes medication to improve control.  Patient should continue healthy diet, regular exercise as tolerated.  Patient is encouraged to make a yearly diabetic eye exam and daily diabetic foot care encouraged - Lipid panel; Future - Hemoglobin A1c; Future  2. Nodule of neck Patient with soft tissue mass at the right back of the neck proximal to the scalp which may represent a lipoma.  Patient is being referred to general surgery for further evaluation and treatment.  Patient requested neurosurgery referral however I discussed with the patient that this area is most likely consistent with a lipoma and will have it initially evaluated for removal by general surgery. -  Ambulatory referral to General Surgery  3. Neck pain Patient with complaint of neck pain.  Order placed for patient to have a cervical spine film done to see if she might have any evidence of cervical degenerative disc disease/spondylosis as a cause of her neck pain.  Patient was given a refill of Robaxin for muscle spasm and is encouraged to use warm moist heat to the painful area.  Depending on results of the x-ray, patient will be notified if further evaluation is warranted. (Patient reported that she and her son could not stay very long for today's appointment and patient seemed to be in a hurry to leave.) - DG Cervical Spine Complete; Future - methocarbamol (ROBAXIN) 500 MG tablet; Take 1 tablet (500 mg total) by mouth every 8 (eight) hours as needed for muscle spasms.  Dispense: 30 tablet; Refill: 0  An After Visit Summary was printed and given to the patient.  Return in about 3 months (around 02/05/2019) for DM-fasting  labs at end of December; 3 months.

## 2018-11-10 ENCOUNTER — Encounter: Payer: Self-pay | Admitting: Family Medicine

## 2018-11-20 ENCOUNTER — Ambulatory Visit (HOSPITAL_COMMUNITY)
Admission: RE | Admit: 2018-11-20 | Discharge: 2018-11-20 | Disposition: A | Discharge status: Home / Self Care | Payer: Medicaid Other | Source: Ambulatory Visit | Attending: Family Medicine | Admitting: Family Medicine

## 2018-11-20 DIAGNOSIS — M542 Cervicalgia: Secondary | ICD-10-CM | POA: Diagnosis not present

## 2018-11-22 ENCOUNTER — Telehealth: Payer: Self-pay | Admitting: *Deleted

## 2018-11-22 NOTE — Telephone Encounter (Signed)
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

## 2018-11-22 NOTE — Telephone Encounter (Signed)
Patient verified DOB Patient is aware of osteo arthritis on multiple levels being noted in the spine. Patient would like to try tylenol 3 instead of tramadol for the pain.

## 2018-11-24 NOTE — Telephone Encounter (Signed)
Please let patient know that her x-ray of her cervical spine did show some mild arthritis.  Patient can take over-the-counter Tylenol arthritis and she reports allergy/intolerance to ibuprofen.  Patient should also use warm, moist heat to the area.  Patient can use over-the-counter lidocaine patches or medication such as BenGay to help with discomfort.  Prescription was provided at her visit for Robaxin for muscle spasm.  Patient on review of chart has had an x-ray of her lumbar spine which did not show any degenerative/arthritis changes.  If patient with continued neck pain despite the use of over-the-counter Tylenol arthritis/Tylenol extra strength, I can make a referral for patient to see physical therapy regarding neck and back pain.

## 2018-11-25 ENCOUNTER — Other Ambulatory Visit: Payer: Self-pay | Admitting: Family Medicine

## 2018-11-25 ENCOUNTER — Telehealth: Payer: Self-pay | Admitting: Family Medicine

## 2018-11-25 NOTE — Telephone Encounter (Signed)
Pt states Tramadol does not work. Pt req Tylenol #3. Please advise [phone].  Pt request send script to [facility] on [address].

## 2018-11-25 NOTE — Telephone Encounter (Signed)
Patient called to get a refill for Tramadol. Please refill if appropriate.

## 2018-11-26 NOTE — Telephone Encounter (Signed)
Elaine Marquez, I think that I addressed this recently. Please let patient know that based on her x-rays she would likely benefit from a referral to physical therapy and she can try otc tylenol arthritis as needed for pain. I will place the physical therapy referral

## 2018-11-27 ENCOUNTER — Telehealth: Payer: Self-pay | Admitting: Family Medicine

## 2018-11-27 NOTE — Telephone Encounter (Signed)
Patient called to check on the status of her tramadol and was informed RN will Follow up with her to share providers note. Please follow up.

## 2018-11-27 NOTE — Telephone Encounter (Signed)
Left message on voicemail to return call. Unable to reach.  

## 2018-11-28 NOTE — Telephone Encounter (Signed)
Pt was informed of message from Dr. Jillyn HiddenFulp.  She hung up the phone did not reply.

## 2018-11-28 NOTE — Telephone Encounter (Signed)
Pt aware of message per Dr. Jillyn HiddenFulp noted on previous encounter.

## 2018-12-09 ENCOUNTER — Other Ambulatory Visit: Payer: Self-pay | Admitting: Family Medicine

## 2018-12-09 DIAGNOSIS — E1165 Type 2 diabetes mellitus with hyperglycemia: Secondary | ICD-10-CM

## 2019-01-01 ENCOUNTER — Ambulatory Visit: Payer: Medicaid Other | Admitting: Family Medicine

## 2019-01-02 ENCOUNTER — Ambulatory Visit: Payer: Medicaid Other | Admitting: Internal Medicine

## 2019-01-07 ENCOUNTER — Ambulatory Visit: Payer: Medicaid Other | Admitting: Nurse Practitioner

## 2019-01-27 ENCOUNTER — Ambulatory Visit (INDEPENDENT_AMBULATORY_CARE_PROVIDER_SITE_OTHER): Payer: Medicaid Other | Admitting: Orthopedic Surgery

## 2019-01-27 ENCOUNTER — Ambulatory Visit (INDEPENDENT_AMBULATORY_CARE_PROVIDER_SITE_OTHER): Payer: Medicaid Other

## 2019-01-27 ENCOUNTER — Encounter (INDEPENDENT_AMBULATORY_CARE_PROVIDER_SITE_OTHER): Payer: Self-pay | Admitting: Orthopedic Surgery

## 2019-01-27 VITALS — Ht 64.5 in | Wt 250.0 lb

## 2019-01-27 DIAGNOSIS — G8929 Other chronic pain: Secondary | ICD-10-CM

## 2019-01-27 DIAGNOSIS — M1712 Unilateral primary osteoarthritis, left knee: Secondary | ICD-10-CM | POA: Diagnosis not present

## 2019-01-27 DIAGNOSIS — M25562 Pain in left knee: Secondary | ICD-10-CM

## 2019-01-28 ENCOUNTER — Ambulatory Visit: Payer: Medicaid Other | Attending: Internal Medicine | Admitting: Family Medicine

## 2019-01-28 ENCOUNTER — Telehealth: Payer: Self-pay | Admitting: *Deleted

## 2019-01-28 ENCOUNTER — Encounter: Payer: Self-pay | Admitting: Family Medicine

## 2019-01-28 VITALS — BP 125/83 | HR 87 | Temp 99.3°F | Ht 64.0 in | Wt 256.0 lb

## 2019-01-28 DIAGNOSIS — Z791 Long term (current) use of non-steroidal anti-inflammatories (NSAID): Secondary | ICD-10-CM | POA: Diagnosis not present

## 2019-01-28 DIAGNOSIS — Z833 Family history of diabetes mellitus: Secondary | ICD-10-CM | POA: Insufficient documentation

## 2019-01-28 DIAGNOSIS — J4521 Mild intermittent asthma with (acute) exacerbation: Secondary | ICD-10-CM | POA: Diagnosis not present

## 2019-01-28 DIAGNOSIS — Z79899 Other long term (current) drug therapy: Secondary | ICD-10-CM | POA: Diagnosis not present

## 2019-01-28 DIAGNOSIS — Z886 Allergy status to analgesic agent status: Secondary | ICD-10-CM | POA: Insufficient documentation

## 2019-01-28 DIAGNOSIS — M47812 Spondylosis without myelopathy or radiculopathy, cervical region: Secondary | ICD-10-CM | POA: Insufficient documentation

## 2019-01-28 DIAGNOSIS — Z7984 Long term (current) use of oral hypoglycemic drugs: Secondary | ICD-10-CM | POA: Insufficient documentation

## 2019-01-28 DIAGNOSIS — Z8249 Family history of ischemic heart disease and other diseases of the circulatory system: Secondary | ICD-10-CM | POA: Diagnosis not present

## 2019-01-28 DIAGNOSIS — Z7952 Long term (current) use of systemic steroids: Secondary | ICD-10-CM | POA: Diagnosis not present

## 2019-01-28 DIAGNOSIS — I10 Essential (primary) hypertension: Secondary | ICD-10-CM | POA: Insufficient documentation

## 2019-01-28 DIAGNOSIS — M4722 Other spondylosis with radiculopathy, cervical region: Secondary | ICD-10-CM | POA: Diagnosis not present

## 2019-01-28 DIAGNOSIS — M542 Cervicalgia: Secondary | ICD-10-CM | POA: Diagnosis not present

## 2019-01-28 DIAGNOSIS — E1165 Type 2 diabetes mellitus with hyperglycemia: Secondary | ICD-10-CM

## 2019-01-28 LAB — POCT URINALYSIS DIP (CLINITEK)
Bilirubin, UA: NEGATIVE
Blood, UA: NEGATIVE
Glucose, UA: 100 mg/dL — AB
Ketones, POC UA: NEGATIVE mg/dL
Leukocytes, UA: NEGATIVE
Nitrite, UA: NEGATIVE
POC PROTEIN,UA: 30 — AB
Spec Grav, UA: 1.01
Urobilinogen, UA: 0.2 U/dL
pH, UA: 5.5

## 2019-01-28 LAB — GLUCOSE, POCT (MANUAL RESULT ENTRY): POC Glucose: 283 mg/dL — AB (ref 70–99)

## 2019-01-28 MED ORDER — TRAMADOL HCL 50 MG PO TABS: 50.0000 mg | ORAL_TABLET | Freq: Three times a day (TID) | ORAL | 0 refills | Status: AC

## 2019-01-28 MED ORDER — PREDNISONE 20 MG PO TABS: 20.0000 mg | ORAL_TABLET | Freq: Every day | ORAL | 0 refills | Status: AC

## 2019-01-28 NOTE — Progress Notes (Signed)
Subjective:    Patient ID: Elaine Marquez, female    DOB: May 06, 1971, 48 y.o.   MRN: [id number]  HPI        48 year old female seen due to complaint of neck pain.  Patient has had cervical spine film on 11/20/2018 which showed multilevel arthritic changes with mild narrowing of the disc spaces, endplate sclerosis and remodeling of vertebral bodies along with posterior facet arthropathy.  Final impression was multilevel osteoarthritic changes of the cervical spine mild to moderate.  She continues to have neck pain with occasional radiation into the upper back and arms.  Pain can be sharp at times and other times burning.  Pain ranges from a 6 to a 10 on a 0-to-10 scale.      Patient is also diabetic and patient's last hemoglobin A1c on 09/12/2018 was elevated at 12.2.  She believes that she can control her blood sugars if she strictly follows a low-carb diet and loses weight through exercise.  She has improved her diet since her last A1c and is trying to exercise more consistently.  She does not wish to take insulin.  She also does not wish to take metformin if possible.  She has been trying to take a low dose of the metformin in addition to glimepiride and she feels that her blood sugars have improved from her last hemoglobin A1c.  She does have issues with increased thirst, urinary frequency and occasional blurred vision.      She has had more recent shortness of breath, chest tightness and wheeze which she believes is due to an exacerbation of her asthma.  She states that she believes the changes in the weather have caused her to have an asthma flareup and she is having to use her albuterol inhaler more often.  Past Medical History:  Diagnosis Date  . Asthma   . Diabetes mellitus without complication ([place])   . Hypertension   . Peritonsillar abscess    Past Surgical History:  Procedure Laterality Date  . TUBAL LIGATION     Family History  Problem Relation Age of Onset  . Hypertension  Mother   . Diabetes Mother   . Hypertension Father   . Diabetes Father   . CVA Brother    Social History   Tobacco Use  . Smoking status: Never Smoker  . Smokeless tobacco: Never Used  Substance Use Topics  . Alcohol use: Yes    Comment: occasionally   . Drug use: No   Allergies  Allergen Reactions  . Ibuprofen Nausea And Vomiting    Review of Systems  Constitutional: Positive for fatigue. Negative for chills and fever.  HENT: Negative for sore throat and trouble swallowing.   Eyes: Positive for visual disturbance (Occasional blurred vision when blood sugars are high). Negative for photophobia.  Respiratory: Positive for chest tightness, shortness of breath and wheezing. Negative for cough.   Cardiovascular: Negative for chest pain, palpitations and leg swelling.  Gastrointestinal: Negative for abdominal pain, constipation, diarrhea and nausea.  Endocrine: Positive for polydipsia and polyuria. Negative for polyphagia.  Genitourinary: Positive for frequency (Occasional when blood sugars are high). Negative for dysuria.  Musculoskeletal: Positive for neck pain and neck stiffness.  Neurological: Negative for dizziness and headaches.       Objective:   Physical Exam BP 125/83 (BP Location: Right Arm, Patient Position: Sitting, Cuff Size: Large)   Pulse 87   Temp 99.3 F (37.4 C) (Oral)   Ht _0  (1.626 m)  Wt 256 lb (116.1 kg)   SpO2 96%   BMI 43.94 kg/m Nurse's notes and vital signs reviewed General-well-nourished, well-developed obese female in no acute distress Neck-patient with posterior cervical paraspinous spasm as well as some mild discomfort with palpation over the posterior cervical spine and decreased neck range of motion secondary to spasm.  Patient also has some complaint of radiation of pain if she turns her neck to either side and extends the opposite arm Lungs- patient with mild scattered wheeze, mild decrease in air movement throughout the lungs.  No  increased work of breathing and no accessory muscle use Cardiovascular-regular rate and rhythm Abdomen-soft, nontender Back-no CVA tenderness Extremities-no edema Foot exam- no active skin breakdown on the feet, no cracking of the skin/ulcerations         Assessment & Plan:  1. Uncontrolled type 2 diabetes mellitus with hyperglycemia ([place]) Patient with uncontrolled type 2 diabetes as patient states that she does not wish to take medication such as metformin or insulin for control of her diabetes and she is convinced that if she follows a healthier diet and exercises to lose weight that her blood sugars will be better controlled.  Patient was asked to start monitoring her blood sugars more frequently and bring blood sugar diary with her and follow-up with clinical pharmacist regarding her blood sugars/diabetes and follow-up in the next 2 weeks.  Patient's last hemoglobin A1c was 12.2 but she has improved to 9.4 but discussed with the patient that she does need to take the metformin to help with insulin resistance as well as insulin if her sugars do not further decrease to goal of 7 or less. - Glucose (CBG) - Hemoglobin A1c - Comprehensive metabolic panel - POCT URINALYSIS DIP ([facility])  2. Spondylosis of cervical spine with radiculopathy; 4.  Neck pain Prescription for prednisone taper but patient was also made aware that this will increase her blood sugar levels.  Patient was strongly encouraged to make sure that she is taking the metformin and glimepiride that have been prescribed as well as to continue efforts at a healthy diet and weight loss.  Remain well-hydrated.  Patient also given prescription for tramadol to take as needed for pain and she will be referred to orthopedics for further evaluation and treatment - predniSONE (DELTASONE) 20 MG tablet; Take 1 tablet (20 mg total) by mouth daily with breakfast for 5 days.  Dispense: 10 tablet; Refill: 0 - traMADol (ULTRAM) 50 MG tablet; Take  1 tablet (50 mg total) by mouth every 8 (eight) hours for 5 days.  Dispense: 15 tablet; Refill: 0 - Ambulatory referral to Orthopedic Surgery  3. Mild intermittent asthma with exacerbation Patient with complaint of mild intermittent asthma and she does have some wheezing on exam.  Patient will be placed on prednisone taper for exacerbation but she is also made aware that this will cause increases in her blood sugar.  Patient should also use her albuterol inhaler every 4-6 hours for the next 72 hours and then may return to as needed use of her albuterol.  Patient should return or go to the emergency department/urgent care if she has any worsening of her asthma or any concerns - predniSONE (DELTASONE) 20 MG tablet; Take 1 tablet (20 mg total) by mouth daily with breakfast for 5 days.  Dispense: 10 tablet; Refill: 0   An After Visit Summary was printed and given to the patient.  Allergies as of 01/28/2019      Reactions   Ibuprofen  Nausea And Vomiting      Medication List       Accurate as of January 28, 2019 11:59 PM. If you have any questions, ask your nurse or doctor.        Accu-Chek Aviva device Use as instructed   Accu-Chek FastClix Lancet Kit Check blood glucose at least 3 times daily.   albuterol (2.5 MG/3ML) 0.083% nebulizer solution Commonly known as:  PROVENTIL Take 3 mLs (2.5 mg total) by nebulization every 6 (six) hours as needed for wheezing.   albuterol 108 (90 Base) MCG/ACT inhaler Commonly known as:  VENTOLIN HFA Inhale 2 puffs into the lungs every 6 (six) hours as needed for wheezing or shortness of breath.   atenolol 25 MG tablet Commonly known as:  TENORMIN Take 25 mg by mouth daily.   benzonatate 100 MG capsule Commonly known as:  TESSALON Take 1 capsule (100 mg total) by mouth every 8 (eight) hours.   celecoxib 100 MG capsule Commonly known as:  CeleBREX Take 1 capsule (100 mg total) by mouth 2 (two) times daily.   glimepiride 4 MG tablet Commonly  known as:  AMARYL Take 1 tablet (4 mg total) by mouth daily before breakfast.   glucose blood test strip Commonly known as:  Accu-Chek Aviva Use as instructed   hydrOXYzine 25 MG tablet Commonly known as:  ATARAX/VISTARIL Take 1 tablet (25 mg total) by mouth every 6 (six) hours.   metFORMIN 500 MG 24 hr tablet Commonly known as:  GLUCOPHAGE-XR Take 1 tablet in the morning with breakfast and 2 tablets in the evening with dinner.   methocarbamol 500 MG tablet Commonly known as:  ROBAXIN Take 1 tablet (500 mg total) by mouth every 8 (eight) hours as needed for muscle spasms.   predniSONE 20 MG tablet Commonly known as:  DELTASONE Take 1 tablet (20 mg total) by mouth daily with breakfast for 5 days. Started by:  Antony Blackbird, MD   terbinafine 1 % cream Commonly known as:  LAMISIL Apply 1 application topically 2 (two) times daily.   traMADol 50 MG tablet Commonly known as:  ULTRAM Take 1 tablet (50 mg total) by mouth every 8 (eight) hours for 5 days.       Return in about 3 weeks (around 02/18/2019) for DM/asthma.

## 2019-01-28 NOTE — Progress Notes (Signed)
Patient complaining of neck, back and knee pain.

## 2019-01-28 NOTE — Telephone Encounter (Signed)
MA provided a VO for Tramadol 15 tablets for 5 days with 1 tablet every 8hrs as needed. Elizabeth from [facility] states the prednisone was received but the pain medication was not.

## 2019-01-29 ENCOUNTER — Encounter (INDEPENDENT_AMBULATORY_CARE_PROVIDER_SITE_OTHER): Payer: Self-pay | Admitting: Orthopedic Surgery

## 2019-01-29 DIAGNOSIS — G8929 Other chronic pain: Secondary | ICD-10-CM | POA: Diagnosis not present

## 2019-01-29 DIAGNOSIS — M1712 Unilateral primary osteoarthritis, left knee: Secondary | ICD-10-CM | POA: Diagnosis not present

## 2019-01-29 DIAGNOSIS — M25562 Pain in left knee: Secondary | ICD-10-CM | POA: Diagnosis not present

## 2019-01-29 LAB — COMPREHENSIVE METABOLIC PANEL WITH GFR
ALT: 18 IU/L (ref 0–32)
AST: 12 IU/L (ref 0–40)
Albumin/Globulin Ratio: 1.6 (ref 1.2–2.2)
Albumin: 4.6 g/dL (ref 3.8–4.8)
Alkaline Phosphatase: 25 IU/L — ABNORMAL LOW (ref 39–117)
BUN/Creatinine Ratio: 13 (ref 9–23)
BUN: 9 mg/dL (ref 6–24)
Bilirubin Total: 0.3 mg/dL (ref 0.0–1.2)
CO2: 20 mmol/L (ref 20–29)
Calcium: 10.3 mg/dL — ABNORMAL HIGH (ref 8.7–10.2)
Chloride: 99 mmol/L (ref 96–106)
Creatinine, Ser: 0.68 mg/dL (ref 0.57–1.00)
GFR calc Af Amer: 120 mL/min/1.73
GFR calc non Af Amer: 104 mL/min/1.73
Globulin, Total: 2.9 g/dL (ref 1.5–4.5)
Glucose: 250 mg/dL — ABNORMAL HIGH (ref 65–99)
Potassium: 4.7 mmol/L (ref 3.5–5.2)
Sodium: 139 mmol/L (ref 134–144)
Total Protein: 7.5 g/dL (ref 6.0–8.5)

## 2019-01-29 LAB — HEMOGLOBIN A1C
Est. average glucose Bld gHb Est-mCnc: 223 mg/dL
Hgb A1c MFr Bld: 9.4 % — ABNORMAL HIGH (ref 4.8–5.6)

## 2019-01-29 MED ORDER — METHYLPREDNISOLONE ACETATE 40 MG/ML IJ SUSP
40.0000 mg | INTRAMUSCULAR | Status: AC | PRN
  Administered 2019-01-29: 40 mg via INTRA_ARTICULAR

## 2019-01-29 MED ORDER — LIDOCAINE HCL 1 % IJ SOLN
5.0000 mL | INTRAMUSCULAR | Status: AC | PRN
  Administered 2019-01-29: 5 mL

## 2019-01-29 MED ORDER — BUPIVACAINE HCL 0.25 % IJ SOLN
4.0000 mL | INTRAMUSCULAR | Status: AC | PRN
  Administered 2019-01-29: 4 mL via INTRA_ARTICULAR

## 2019-01-29 NOTE — Progress Notes (Addendum)
Office Visit Note   Patient: Elaine Marquez           Date of Birth: 22-May-1971           MRN: [id number] Visit Date: 01/27/2019 Requested by: Cain Saupe, MD [address] [place], [place] [address] PCP: Cain Saupe, MD  Subjective: Chief Complaint  Patient presents with  . Left Knee - Pain    HPI: Patient presents for evaluation of left knee.  She was last seen 03/29/2017.  Injection helped at that time but not for as long as she would have liked.  She describes several months of left knee pain.  Describes some mechanical symptoms such as locking and popping.  Symptoms are worse on the lateral aspect of the knee compared to medial.  She is taking Tylenol without much relief.  She denies any groin pain.              ROS: All systems reviewed are negative as they relate to the chief complaint within the history of present illness.  Patient denies  fevers or chills.   Assessment & Plan: Visit Diagnoses:  1. Chronic pain of left knee   2. Unilateral primary osteoarthritis, left knee     Plan: Impression is left knee pain with medial joint and compartment arthritis on radiographs which is not severe but is likely moderate.  She is having more lateral sided pain.  Not much in the way of effusion.  I think we should try an injection first and then if that does not help come back in 6 to 8 weeks and we can reevaluate and potentially consider repeat imaging to see if there is something arthroscopically treatable.  Follow-Up Instructions: No follow-ups on file.   Orders:  Orders Placed This Encounter  Procedures  . XR KNEE 3 VIEW LEFT   No orders of the defined types were placed in this encounter.     Procedures: Large Joint Inj: L knee on 01/29/2019 8:49 AM Indications: diagnostic evaluation, joint swelling and pain Details: 18 G 1.5 in needle, superolateral approach  Arthrogram: No  Medications: 5 mL lidocaine 1 %; 40 mg methylPREDNISolone acetate 40 MG/ML; 4 mL  bupivacaine 0.25 % Outcome: tolerated well, no immediate complications Procedure, treatment alternatives, risks and benefits explained, specific risks discussed. Consent was given by the patient. Immediately prior to procedure a time out was called to verify the correct patient, procedure, equipment, support staff and site/side marked as required. Patient was prepped and draped in the usual sterile fashion.       Clinical Data: No additional findings.  Objective: Vital Signs: Ht 5' 4.5" (1.638 m)   Wt 250 lb (113.4 kg)   BMI 42.25 kg/m   Physical Exam:   Constitutional: Patient appears well-developed HEENT:  Head: Normocephalic Eyes:EOM are normal Neck: Normal range of motion Cardiovascular: Normal rate Pulmonary/chest: Effort normal Neurologic: Patient is alert Skin: Skin is warm Psychiatric: Patient has normal mood and affect    Ortho Exam: Ortho exam demonstrates full active and passive range of motion of that left knee.  No effusion.  Collateral crucial ligaments are stable.  No groin pain with internal X rotation of the leg.  Does have lateral greater than medial joint line tenderness but negative McMurray compression testing.  Extensor mechanism is intact and nontender  Specialty Comments:  No specialty comments available.  Imaging: No results found.   PMFS History: Patient Active Problem List   Diagnosis Date Noted  . Type  2 diabetes mellitus without complication, without long-term current use of insulin ([facility]) 05/15/2018   Past Medical History:  Diagnosis Date  . Asthma   . Diabetes mellitus without complication ([facility])   . Hypertension   . Peritonsillar abscess     Family History  Problem Relation Age of Onset  . Hypertension Mother   . Diabetes Mother   . Hypertension Father   . Diabetes Father   . CVA Brother     Past Surgical History:  Procedure Laterality Date  . TUBAL LIGATION     Social History   Occupational History  . Not on file    Tobacco Use  . Smoking status: Never Smoker  . Smokeless tobacco: Never Used  Substance and Sexual Activity  . Alcohol use: Yes    Comment: occasionally   . Drug use: No  . Sexual activity: Not on file

## 2019-01-31 ENCOUNTER — Telehealth: Payer: Self-pay | Admitting: *Deleted

## 2019-01-31 NOTE — Telephone Encounter (Signed)
Patient verified DOB Patients husband is aware of patient needing to take DM medications as prescribed and to focus on eliminating sugars and eating a healthy low fat diet. No further questions.

## 2019-01-31 NOTE — Telephone Encounter (Signed)
-----   Message from Cain Saupe, MD sent at 01/30/2019 11:11 PM EST ----- A1c at 9.4 which is above the goal of 7 or less. Glucose elevated at 250

## 2019-02-03 ENCOUNTER — Ambulatory Visit (INDEPENDENT_AMBULATORY_CARE_PROVIDER_SITE_OTHER): Payer: Medicaid Other | Admitting: Orthopaedic Surgery

## 2019-02-03 ENCOUNTER — Other Ambulatory Visit: Payer: Self-pay | Admitting: Internal Medicine

## 2019-02-04 ENCOUNTER — Telehealth: Payer: Self-pay | Admitting: Family Medicine

## 2019-02-04 NOTE — Telephone Encounter (Signed)
Patient wants to talk about medication because she has issues with her heart filling like it is skipping a beat

## 2019-02-06 NOTE — Telephone Encounter (Signed)
I will return call to patient tomorrow when I am allowed to return to the office

## 2019-02-07 NOTE — Telephone Encounter (Signed)
Patient had left a phone message regarding question as to whether or not 1 of her medications could be causing her to have sensation of skipped heartbeats.  When patient was contacted by phone, patient states that the sensation had resolved and she believes that this was related to having "gas".  Patient is having no current issues

## 2019-02-11 ENCOUNTER — Other Ambulatory Visit: Payer: Self-pay | Admitting: Family Medicine

## 2019-02-12 ENCOUNTER — Encounter (INDEPENDENT_AMBULATORY_CARE_PROVIDER_SITE_OTHER): Payer: Self-pay | Admitting: Orthopaedic Surgery

## 2019-02-12 ENCOUNTER — Other Ambulatory Visit: Payer: Self-pay | Admitting: Family Medicine

## 2019-02-12 ENCOUNTER — Ambulatory Visit (INDEPENDENT_AMBULATORY_CARE_PROVIDER_SITE_OTHER): Payer: Medicaid Other | Admitting: Orthopaedic Surgery

## 2019-02-12 VITALS — BP 134/94 | HR 95 | Ht 64.0 in | Wt 256.0 lb

## 2019-02-12 DIAGNOSIS — E1165 Type 2 diabetes mellitus with hyperglycemia: Secondary | ICD-10-CM

## 2019-02-12 DIAGNOSIS — M47812 Spondylosis without myelopathy or radiculopathy, cervical region: Secondary | ICD-10-CM | POA: Diagnosis not present

## 2019-02-12 DIAGNOSIS — J4520 Mild intermittent asthma, uncomplicated: Secondary | ICD-10-CM

## 2019-02-12 NOTE — Addendum Note (Signed)
Addended by: Henrine Screws L on: 02/12/2019 10:23 AM   Modules accepted: Orders

## 2019-02-12 NOTE — Progress Notes (Addendum)
Office Visit Note   Patient: Elaine Marquez           Date of Birth: 03-06-1971           MRN: [id number] Visit Date: 02/12/2019              Requested by: Cain Saupe, MD [address] [place], [place] [address] PCP: Cain Saupe, MD   Assessment & Plan: Visit Diagnoses:  1. Spondylosis without myelopathy or radiculopathy, cervical region     Plan: We will set patient up for some physical therapy.  She can use Tylenol, ice, heat.  She not able to take anti-inflammatories due to GI problems.  We discussed using prednisone or Medrol Dosepak however with her diabetes we like to avoid this.  We reviewed x-rays with patient also her husband who is former patient of mine.  Recheck 6 weeks.  Thanks the opportunity to see her in consultation.  Follow-Up Instructions: Return in about 6 weeks (around 03/26/2019).   Orders:  No orders of the defined types were placed in this encounter.  No orders of the defined types were placed in this encounter.     Procedures: No procedures performed   Clinical Data: No additional findings.   Subjective: Chief Complaint  Patient presents with  . Neck - Pain    HPI 48 year old female orthopedic consultation requested by Dr. Leona Singleton here with greater than a year history of neck pain worse on the right than left primarily upper cervical spine.  Pain radiates into her shoulder sometimes worse on one side and then switches.  She got some Ultram which seemed to help.  She can take Aleve or ibuprofen since it bothers her stomach with nausea and vomiting.  She is not been through any therapy.  She had previous x-rays done at [facility] that showed multilevel spondylosis with spurring and facet arthropathy endplate spurring.  She denies any myelopathy changes.  Review of Systems 14 point systems positive for type 2 diabetes not on insulin.  All other review systems are negative as pertains HPI.  No fever or chills.   Objective: Vital  Signs: BP (!) 134/94   Pulse 95   Ht 5\' 4"  (1.626 m)   Wt 256 lb (116.1 kg)   BMI 43.94 kg/m   Physical Exam Constitutional:      Appearance: She is well-developed.  HENT:     Head: Normocephalic.     Right Ear: External ear normal.     Left Ear: External ear normal.  Eyes:     Pupils: Pupils are equal, round, and reactive to light.  Neck:     Thyroid: No thyromegaly.     Trachea: No tracheal deviation.  Cardiovascular:     Rate and Rhythm: Normal rate.  Pulmonary:     Effort: Pulmonary effort is normal.  Abdominal:     Palpations: Abdomen is soft.  Skin:    General: Skin is warm and dry.  Neurological:     Mental Status: She is alert and oriented to person, place, and time.  Psychiatric:        Behavior: Behavior normal.     Ortho Exam patient has some brachial plexus tenderness right greater than left.  Tenderness paraspinals upper cervical on the right side.  No specific nodule noted.  Positive Spurling.  Upper extremity reflexes are 2+ and symmetrical no isolated motor weakness negative impingement of the shoulders.  Some discomfort forward flexion and extension limited 50% of  normal range of motion.  Normal heel toe gait no lower extremity clonus or hyperreflexia. Specialty Comments:  No specialty comments available.  Imaging: CLINICAL DATA:  Chronic right-sided neck pain.  EXAM: CERVICAL SPINE - COMPLETE 4+ VIEW  COMPARISON:  Neck CT 11/29/2014  FINDINGS: There is no evidence of cervical spine fracture or prevertebral soft tissue swelling. Straightening of the cervical lordosis. Multilevel arthritic changes with mild narrowing of the disc spaces, endplate sclerosis and remodeling of vertebral bodies. Associated posterior facet arthropathy.  IMPRESSION: Multilevel osteoarthritic changes of the cervical spine, mild-to-moderate.   Electronically Signed   By: Ted Mcalpine M.D.   On: 11/20/2018 14:56    PMFS History: Patient Active  Problem List   Diagnosis Date Noted  . Spondylosis without myelopathy or radiculopathy, cervical region 02/12/2019  . Type 2 diabetes mellitus without complication, without long-term current use of insulin ([facility]) 05/15/2018   Past Medical History:  Diagnosis Date  . Asthma   . Diabetes mellitus without complication ([facility])   . Hypertension   . Peritonsillar abscess     Family History  Problem Relation Age of Onset  . Hypertension Mother   . Diabetes Mother   . Hypertension Father   . Diabetes Father   . CVA Brother     Past Surgical History:  Procedure Laterality Date  . TUBAL LIGATION     Social History   Occupational History  . Not on file  Tobacco Use  . Smoking status: Never Smoker  . Smokeless tobacco: Never Used  Substance and Sexual Activity  . Alcohol use: Yes    Comment: occasionally   . Drug use: No  . Sexual activity: Not on file

## 2019-02-19 ENCOUNTER — Ambulatory Visit: Payer: Medicaid Other | Admitting: Family Medicine

## 2019-02-19 ENCOUNTER — Telehealth: Payer: Self-pay | Admitting: *Deleted

## 2019-02-19 NOTE — Telephone Encounter (Signed)
Patient no showed for their most recent appointment 02/19/2019. Patient forgot about the appointment. Please offer the next available appointment.

## 2019-02-20 ENCOUNTER — Other Ambulatory Visit: Payer: Self-pay | Admitting: Family Medicine

## 2019-02-20 ENCOUNTER — Ambulatory Visit: Payer: Medicaid Other | Attending: Family Medicine | Admitting: Family Medicine

## 2019-02-20 DIAGNOSIS — I10 Essential (primary) hypertension: Secondary | ICD-10-CM

## 2019-02-20 DIAGNOSIS — E1165 Type 2 diabetes mellitus with hyperglycemia: Secondary | ICD-10-CM

## 2019-02-20 MED ORDER — ATENOLOL 25 MG PO TABS: 25.0000 mg | ORAL_TABLET | Freq: Every day | ORAL | 3 refills | Status: DC

## 2019-02-20 NOTE — Progress Notes (Signed)
Patient ID: Elaine Marquez, female   DOB: [birth year], 48 y.o.   MRN: [id number]   Patient with patient yesterday which she missed and patient rescheduled appointment today but had to leave as her son had a band concert per medical assistant. Patient requested refill of atenolol as this is the reason that she had scheduled the appointment. Atenolol refill sent to [facility].

## 2019-03-10 ENCOUNTER — Other Ambulatory Visit: Payer: Self-pay | Admitting: Family Medicine

## 2019-03-10 DIAGNOSIS — E119 Type 2 diabetes mellitus without complications: Secondary | ICD-10-CM

## 2019-03-17 ENCOUNTER — Other Ambulatory Visit: Payer: Self-pay | Admitting: Family Medicine

## 2019-03-17 DIAGNOSIS — E119 Type 2 diabetes mellitus without complications: Secondary | ICD-10-CM

## 2019-03-25 ENCOUNTER — Telehealth (INDEPENDENT_AMBULATORY_CARE_PROVIDER_SITE_OTHER): Payer: Self-pay | Admitting: Radiology

## 2019-03-25 NOTE — Telephone Encounter (Signed)
I left voicemail on home phone requesting return call to confirm appointment and review COVID-19 screening questions. Mobile number is no longer in service.

## 2019-03-26 ENCOUNTER — Ambulatory Visit (INDEPENDENT_AMBULATORY_CARE_PROVIDER_SITE_OTHER): Payer: Medicaid Other | Admitting: Orthopaedic Surgery

## 2019-03-29 ENCOUNTER — Other Ambulatory Visit: Payer: Self-pay | Admitting: Internal Medicine

## 2019-03-29 DIAGNOSIS — I10 Essential (primary) hypertension: Secondary | ICD-10-CM

## 2019-04-03 ENCOUNTER — Other Ambulatory Visit: Payer: Self-pay | Admitting: Family Medicine

## 2019-04-03 DIAGNOSIS — J4520 Mild intermittent asthma, uncomplicated: Secondary | ICD-10-CM

## 2019-04-03 DIAGNOSIS — E1165 Type 2 diabetes mellitus with hyperglycemia: Secondary | ICD-10-CM

## 2019-04-23 ENCOUNTER — Ambulatory Visit: Payer: Self-pay | Admitting: Orthopaedic Surgery

## 2019-05-02 ENCOUNTER — Other Ambulatory Visit: Payer: Self-pay | Admitting: Family Medicine

## 2019-05-15 ENCOUNTER — Other Ambulatory Visit: Payer: Self-pay | Admitting: Family Medicine

## 2019-05-15 DIAGNOSIS — E119 Type 2 diabetes mellitus without complications: Secondary | ICD-10-CM

## 2019-05-19 ENCOUNTER — Encounter: Payer: Self-pay | Admitting: Family Medicine

## 2019-06-13 ENCOUNTER — Other Ambulatory Visit: Payer: Self-pay | Admitting: Family Medicine

## 2019-06-13 DIAGNOSIS — J4520 Mild intermittent asthma, uncomplicated: Secondary | ICD-10-CM

## 2019-06-13 DIAGNOSIS — E1165 Type 2 diabetes mellitus with hyperglycemia: Secondary | ICD-10-CM

## 2019-06-20 ENCOUNTER — Other Ambulatory Visit: Payer: Self-pay | Admitting: Family Medicine

## 2019-06-20 DIAGNOSIS — E1165 Type 2 diabetes mellitus with hyperglycemia: Secondary | ICD-10-CM

## 2019-07-16 ENCOUNTER — Other Ambulatory Visit: Payer: Self-pay | Admitting: Family Medicine

## 2019-08-12 ENCOUNTER — Other Ambulatory Visit: Payer: Self-pay | Admitting: Family Medicine

## 2019-08-12 DIAGNOSIS — J4520 Mild intermittent asthma, uncomplicated: Secondary | ICD-10-CM

## 2019-08-12 DIAGNOSIS — E1165 Type 2 diabetes mellitus with hyperglycemia: Secondary | ICD-10-CM

## 2019-08-13 ENCOUNTER — Other Ambulatory Visit: Payer: Self-pay | Admitting: Family Medicine

## 2019-08-13 DIAGNOSIS — I10 Essential (primary) hypertension: Secondary | ICD-10-CM

## 2019-09-02 ENCOUNTER — Ambulatory Visit (INDEPENDENT_AMBULATORY_CARE_PROVIDER_SITE_OTHER): Payer: Medicaid Other | Admitting: Orthopaedic Surgery

## 2019-09-02 ENCOUNTER — Encounter: Payer: Self-pay | Admitting: Orthopaedic Surgery

## 2019-09-02 ENCOUNTER — Ambulatory Visit (INDEPENDENT_AMBULATORY_CARE_PROVIDER_SITE_OTHER): Payer: Medicaid Other

## 2019-09-02 VITALS — BP 122/50 | HR 120 | Ht 64.5 in | Wt 258.0 lb

## 2019-09-02 DIAGNOSIS — M542 Cervicalgia: Secondary | ICD-10-CM

## 2019-09-02 MED ORDER — TRAMADOL HCL 50 MG PO TABS: 50.0000 mg | ORAL_TABLET | Freq: Two times a day (BID) | ORAL | 0 refills | Status: DC | PRN

## 2019-09-02 NOTE — Progress Notes (Signed)
Office Visit Note   Patient: Elaine Marquez           Date of Birth: 1971-04-20           MRN: [id number] Visit Date: 09/02/2019              Requested by: Cain SaupeFulp, Cammie, MD [address] [place],  [place] [id number] PCP: Cain SaupeFulp, Cammie, MD   Assessment & Plan: Visit Diagnoses:  1. Neck pain     Plan: We will set patient up for some physical therapy she can talk to her PCP about possibly Celebrex since other anti-inflammatories bother her stomach.  I will check her back again in 2 months.  She is having persistent problems we may need to consider cervical imaging with her calcification posterior longitudinal ligament she is at risk for cervical stenosis progression but current has no myelopathic symptoms.  Follow-Up Instructions: No follow-ups on file.   Orders:  Orders Placed This Encounter  Procedures  . XR Cervical Spine 2 or 3 views   No orders of the defined types were placed in this encounter.     Procedures: No procedures performed   Clinical Data: No additional findings.   Subjective: Chief Complaint  Patient presents with  . Neck - Pain    HPI 48 year old female whose husband is a patient of mine is seen with chronic neck pain that is been going on for greater than 9 months.  We ordered therapy in February but this was canceled due to COVID.  2 weeks ago she turned her head in the car and felt a pop and has had significant increased pain since that time.  She has some low back pain but has not noticed any gait disturbance related to her neck.  She has had few tramadol in the past which helped to some degree.  She has type 2 diabetes on insulin.  Patient states in the last month her neck pain is been severe bothers her all day wakes her up at night and has increased pain particularly with cervical motion.  Review of Systems 14 point system update unchanged from February.  Of note is insulin-dependent diabetes.   Objective: Vital Signs: BP (!) 122/50    Pulse (!) 120   Ht 5' 4.5" (1.638 m)   Wt 258 lb (117 kg)   BMI 43.60 kg/m   Physical Exam Constitutional:      Appearance: She is well-developed.  HENT:     Head: Normocephalic.     Right Ear: External ear normal.     Left Ear: External ear normal.  Eyes:     Pupils: Pupils are equal, round, and reactive to light.  Neck:     Thyroid: No thyromegaly.     Trachea: No tracheal deviation.  Cardiovascular:     Rate and Rhythm: Normal rate.  Pulmonary:     Effort: Pulmonary effort is normal.  Abdominal:     Palpations: Abdomen is soft.  Skin:    General: Skin is warm and dry.  Neurological:     Mental Status: She is alert and oriented to person, place, and time.  Psychiatric:        Behavior: Behavior normal.     Ortho Exam patient has intact upper extremity reflexes no impingement of the shoulder some brachial plexus tenderness.  Limited forward flexion chin forefinger fingerbreadths chin to chest.  Some pain with extension.  Negative Lhermitte.  Biceps triceps wrist flexion extension weakness.  Specialty Comments:  No specialty comments available.  Imaging: No results found.   PMFS History: Patient Active Problem List   Diagnosis Date Noted  . Spondylosis without myelopathy or radiculopathy, cervical region 02/12/2019  . Type 2 diabetes mellitus without complication, without long-term current use of insulin ([place]) 05/15/2018   Past Medical History:  Diagnosis Date  . Asthma   . Diabetes mellitus without complication ([place])   . Hypertension   . Peritonsillar abscess     Family History  Problem Relation Age of Onset  . Hypertension Mother   . Diabetes Mother   . Hypertension Father   . Diabetes Father   . CVA Brother     Past Surgical History:  Procedure Laterality Date  . TUBAL LIGATION     Social History   Occupational History  . Not on file  Tobacco Use  . Smoking status: Never Smoker  . Smokeless tobacco: Never Used  Substance and Sexual Activity   . Alcohol use: Yes    Comment: occasionally   . Drug use: No  . Sexual activity: Not on file

## 2019-09-02 NOTE — Addendum Note (Signed)
Addended by: Meyer Cory on: 09/02/2019 02:47 PM   Modules accepted: Orders

## 2019-09-16 DIAGNOSIS — Z23 Encounter for immunization: Secondary | ICD-10-CM | POA: Diagnosis not present

## 2019-09-17 ENCOUNTER — Ambulatory Visit: Payer: Medicaid Other | Admitting: Physical Therapy

## 2019-09-26 ENCOUNTER — Ambulatory Visit: Payer: Medicaid Other | Attending: Orthopaedic Surgery | Admitting: Physical Therapy

## 2019-09-26 ENCOUNTER — Other Ambulatory Visit: Payer: Self-pay

## 2019-09-26 ENCOUNTER — Encounter: Payer: Self-pay | Admitting: Physical Therapy

## 2019-09-26 DIAGNOSIS — M542 Cervicalgia: Secondary | ICD-10-CM

## 2019-09-26 NOTE — Therapy (Signed)
[facility] [address] [place], [place], [address] Phone: [phone]   Fax:  [phone]  Physical Therapy Evaluation  Patient Details  Name: Elaine Marquez MRN: [id number] Date of Birth: 01/09/1971 Referring Provider (PT): Annell Greening, MD   Encounter Date: 09/26/2019  PT End of Session - 09/26/19 1120    Visit Number  1    Number of Visits  4    Date for PT Re-Evaluation  10/24/19    Authorization Type  Medicaid    PT Start Time  0848    PT Stop Time  0927    PT Time Calculation (min)  39 min    Activity Tolerance  Patient tolerated treatment well    Behavior During Therapy  [facility] for tasks assessed/performed       Past Medical History:  Diagnosis Date  . Asthma   . Diabetes mellitus without complication (HCC)   . Hypertension   . Peritonsillar abscess     Past Surgical History:  Procedure Laterality Date  . TUBAL LIGATION      There were no vitals filed for this visit.   Subjective Assessment - 09/26/19 0857    Subjective  Pt. reports insidious onset of neck pain about 2 years ago which has been worse the last 10 months-no mechanism of injury noted. Primary pain is in lower neck region and is exacerbated in particular with movements into extension. Pt. reports she sometimes has numbness and discomfort into her left arm and hand but primary symptoms local to neck. X-rays revealed degenerative changes and some calcification of posterior longitudinal ligament.    Pertinent History  diabetic, asthma, HTN    Limitations  Sitting;House hold activities    Diagnostic tests  X-rays    Patient Stated Goals  Improve neck pain    Currently in Pain?  Yes    Pain Score  4     Pain Location  Neck    Pain Descriptors / Indicators  Aching   intermittent sharp pain   Pain Type  Chronic pain    Pain Onset  More than a month ago    Pain Frequency  Constant    Aggravating Factors   cervical extension motion, turning head,  lifting activities    Pain Relieving Factors  mild ease with medication    Effect of Pain on Daily Activities  difficulty turning head for ADLs/IADLs and driving, decreased positional tolerance, sleep disturbance         [facility] PT Assessment - 09/26/19 0001      Assessment   Medical Diagnosis  Neck pain    Referring Provider (PT)  Annell Greening, MD    Onset Date/Surgical Date  09/25/17   per report 2 year history, pain exacerbated 10 months ago   Hand Dominance  Right    Prior Therapy  none      Precautions   Precautions  None      Restrictions   Weight Bearing Restrictions  No      Balance Screen   Has the patient fallen in the past 6 months  No      Prior Function   Level of Independence  Independent with basic ADLs    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 shoulder AROM grossly [facility]    AROM Assessment Site  Cervical    Cervical Flexion  17    Cervical Extension  10   increased neck pain   Cervical - Right Side Bend  20    Cervical - Left Side Bend  14    Cervical - Right Rotation  32    Cervical - Left Rotation  43      Strength   Overall Strength Comments  Bilat. UE MMTs grossly 5/5      Palpation   Palpation comment  Tight and tender to palpation left>right upper trapezius region, hypertonic right>left cervical paraspinals with associated tenderness, very tender lower cerivcal paraspinal region around C6-7 region      Special Tests   Other special tests  Spurling's (-), ULTT for median, radial and ulnar nerves (-)                Objective measurements completed  on examination: See above findings.      [facility] Adult PT Treatment/Exercise - 09/26/19 0001      Exercises   Exercises  Neck      Neck Exercises: Seated   Other Seated Exercise  HEP handout review and brief practice retractions and cervical flexion stretch-see chart copy handout             PT Education - 09/26/19 1112    Education Details  eval findings, symptom etiology and spine anatomy, POC, HEP    Person(s) Educated  Patient    Methods  Explanation;Demonstration;Verbal cues;Handout    Comprehension  Verbalized understanding;Returned demonstration          PT Long Term Goals - 09/26/19 1354      PT LONG TERM GOAL #1   Title  Independent with HEP    Baseline  needs HEP    Time  4    Period  Weeks    Status  New    Target Date  10/24/19      PT LONG TERM GOAL #2   Title  Increase right cervical rotation AROM at least 10 deg to improve ability to turn head while driving    Baseline  32 deg    Time  4    Period  Weeks    Status  New    Target Date  10/24/19      PT LONG TERM GOAL #3   Title  Tolerate sitting for periods at least 20-30 minutes for car travel, eating meals with pain decreased at least 25% from current status    Baseline  Pain 4/10 this AM but can reach 8-9/10    Time  4    Period  Weeks    Status  New    Target Date  10/24/19             Plan - 09/26/19 1121    Clinical Impression Statement  Pt. presents with cervical pain with extension bias for ROM consistent with underlying degenerative changes as well as a significant amount of accompanying myofascial pain and tightness. She has noted some intermittent left UE radiating symptoms which could potentially be radicular but Spurling's (-) and overall/primary symptoms local to neck. Pt. would benefit from PT  to help relieve pain and address current associated functional limitations.    Personal Factors and Comorbidities  Time since onset of injury/illness/exacerbation;Comorbidity 2     Comorbidities  diabetic, asthma    Examination-Activity Limitations  Lift;Sleep;Sit    Examination-Participation Restrictions  Laundry;Cleaning;Community Activity    Stability/Clinical Decision Making  Stable/Uncomplicated    Clinical Decision Making  Low    Rehab Potential  Good    PT Frequency  1x / week    PT Duration  4 weeks    PT Treatment/Interventions  ADLs/Self Care Home Management;Electrical Stimulation;Moist Heat;Traction;Ultrasound;Cryotherapy;Therapeutic exercise;Therapeutic activities;Functional mobility training;Neuromuscular re-education;Manual techniques;Dry needling;Spinal Manipulations;Taping    PT Next Visit Plan  review HEP as needed, flexion bias cervical ROM and stretches, trial traction manual vs. mechanical, STM, plan trial dry needling to cervical paraspinals and upper trapezius region    PT Home Exercise Plan  cervical retractions, cervical flexion stretch, upper trapezius stretch, scapular retractions    Consulted and Agree with Plan of Care  Patient       Patient will benefit from skilled therapeutic intervention in order to improve the following deficits and impairments:  Pain, Impaired flexibility, Increased muscle spasms, Decreased range of motion  Visit Diagnosis: Cervicalgia     Problem List Patient Active Problem List   Diagnosis Date Noted  . Spondylosis without myelopathy or radiculopathy, cervical region 02/12/2019  . Type 2 diabetes mellitus without complication, without long-term current use of insulin ([place]) 05/15/2018    Beaulah Dinning, PT, DPT 09/26/19 1:57 PM  [facility] [address] [place], [place], [address] Phone: [phone]   Fax:  [phone]  Name: JAKAYA JACOBOWITZ MRN: [id number] Date of Birth: [birth year]

## 2019-10-06 ENCOUNTER — Other Ambulatory Visit: Payer: Self-pay | Admitting: Family Medicine

## 2019-10-06 DIAGNOSIS — E119 Type 2 diabetes mellitus without complications: Secondary | ICD-10-CM

## 2019-10-10 ENCOUNTER — Other Ambulatory Visit: Payer: Self-pay

## 2019-10-10 ENCOUNTER — Ambulatory Visit: Payer: Medicaid Other | Admitting: Physical Therapy

## 2019-10-10 ENCOUNTER — Encounter: Payer: Self-pay | Admitting: Physical Therapy

## 2019-10-10 DIAGNOSIS — M542 Cervicalgia: Secondary | ICD-10-CM | POA: Diagnosis not present

## 2019-10-10 NOTE — Patient Instructions (Signed)
Access Code: [email or web address]  URL: [email or web address]  Date: 10/10/2019  Prepared by: Hilda Blades   Exercises Scapular Retraction with Resistance Advanced - 10 reps - 1-2 seconds hold - 2-3x daily Trapezius Mobilization with Small Diona Foley

## 2019-10-10 NOTE — Therapy (Signed)
[facility] [place], [place], [address] Phone: [phone]   Fax:  [phone]  Physical Therapy Treatment  Patient Details  Name: Elaine Marquez MRN: [id number] Date of Birth: 18-Mar-1971 Referring Provider (PT): Rodell Perna, MD   Encounter Date: 10/10/2019  PT End of Session - 10/10/19 0925    Visit Number  2    Number of Visits  4    Date for PT Re-Evaluation  10/24/19    Authorization Type  Medicaid    Authorization Time Period  10/20-11/9    Authorization - Visit Number  1    Authorization - Number of Visits  3    PT Start Time  0828    PT Stop Time  0908    PT Time Calculation (min)  40 min    Activity Tolerance  Patient tolerated treatment well    Behavior During Therapy  [facility] for tasks assessed/performed       Past Medical History:  Diagnosis Date  . Asthma   . Diabetes mellitus without complication ([place])   . Hypertension   . Peritonsillar abscess     Past Surgical History:  Procedure Laterality Date  . TUBAL LIGATION      There were no vitals filed for this visit.  Subjective Assessment - 10/10/19 0834    Subjective  Patient reports her neck pain remains the same, the nerve pain going down her left arm has been worsening since she started exercise. 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 When the pain comes on she feels she can press around her shoulder or elbow and that will help relieve the pain.    Currently in Pain?  Yes    Pain Score  6     Pain Location  Neck    Pain Orientation  Left;Lower    Pain Descriptors / Indicators  Sharp;Aching    Pain Type  Chronic pain    Pain Radiating Towards  left arm    Pain Onset  More than a month ago    Pain Frequency  Constant    Multiple Pain Sites  Yes    Pain Score  7    Pain Location  Arm    Pain Orientation  Left    Pain Descriptors / Indicators  Shooting    Pain Type   Chronic pain    Pain Onset  More than a month ago    Pain Frequency  Intermittent         [facility] PT Assessment - 10/10/19 0001      ROM / Strength   AROM / PROM / Strength  AROM      AROM   AROM Assessment Site  Cervical    Cervical - Right Rotation  48   post manual therapy   Cervical - Left Rotation  45   post manual therapy                  [facility] Adult PT Treatment/Exercise - 10/10/19 0001      Exercises   Exercises  Neck      Neck Exercises: Theraband   Scapula Retraction  10 reps    Scapula Retraction Limitations  Standing with yellow band, cued heavily to avoid shrug and relax upper traps/neck      Neck Exercises: Standing   Other Standing Exercises  Self STM using tennis ball to posterior shoulder  musculature      Neck Exercises: Seated   Neck Retraction  10 reps    Cervical Rotation  10 reps    Lateral Flexion  10 reps      Neck Exercises: Supine   Neck Retraction  10 reps    Neck Retraction Limitations  Into pillow      Manual Therapy   Manual Therapy  Soft tissue mobilization;Passive ROM;Manual Traction    Soft tissue mobilization  Left posterior cuff and upper trap region    Passive ROM  Bilateral upper trap/levator stretch    Manual Traction  Cervical with suboccipital release      Neck Exercises: Stretches   Upper Trapezius Stretch  30 seconds;Left    Upper Trapezius Stretch Limitations  Patient reported slight increase in left arm symptoms    Levator Stretch  Left;30 seconds             PT Education - 10/10/19 0839    Education Details  HEP    Person(s) Educated  Patient    Methods  Explanation;Demonstration;Verbal cues;Handout    Comprehension  Verbalized understanding;Verbal cues required;Need further instruction          PT Long Term Goals - 09/26/19 1354      PT LONG TERM GOAL #1   Title  Independent with HEP    Baseline  needs HEP    Time  4    Period  Weeks    Status  New    Target Date  10/24/19      PT  LONG TERM GOAL #2   Title  Increase right cervical rotation AROM at least 10 deg to improve ability to turn head while driving    Baseline  32 deg    Time  4    Period  Weeks    Status  New    Target Date  10/24/19      PT LONG TERM GOAL #3   Title  Tolerate sitting for periods at least 20-30 minutes for car travel, eating meals with pain decreased at least 25% from current status    Baseline  Pain 4/10 this AM but can reach 8-9/10    Time  4    Period  Weeks    Status  New    Target Date  10/24/19            Plan - 10/10/19 0841    Clinical Impression Statement  Patient reported reduce neck and left arm pain following manual therapy and imrpoved ability to turn her neck. It is unclear what is driving the left arm symptoms, cerivcal radiculopathy vs. referred muscular pain as her arm pain was reproduced with soft tissue mobilization to the posterior cuff. She was encoraged to work on posture and relaxing shoulders to avoid increased tension of neck/upper traps. She tolerated addition of banded exercise well. She would benefit from continued skilled PT to improve her motion and reduce her pain with sitting for extended periods.    PT Treatment/Interventions  ADLs/Self Care Home Management;Electrical Stimulation;Moist Heat;Traction;Ultrasound;Cryotherapy;Therapeutic exercise;Therapeutic activities;Functional mobility training;Neuromuscular re-education;Manual techniques;Dry needling;Spinal Manipulations;Taping;Passive range of motion;Joint Manipulations;Cognitive remediation    PT Next Visit Plan  Consider dry needling to left posterior cuff and upper trap region, other manual therapy PRN, postural control through periscapular and posterior cuff strengthening    PT Home Exercise Plan  Patient instructed to perform HEP as tolerated: seated cervical retraction, cervical flexion stretch, upper trap stretch, standing banded scap retractions, self STM using  small ball to left upper trap and  posterior shoulder    Consulted and Agree with Plan of Care  Patient       Patient will benefit from skilled therapeutic intervention in order to improve the following deficits and impairments:  Pain, Impaired flexibility, Increased muscle spasms, Decreased range of motion, Postural dysfunction, Decreased strength, Decreased mobility  Visit Diagnosis: Cervicalgia     Problem List Patient Active Problem List   Diagnosis Date Noted  . Spondylosis without myelopathy or radiculopathy, cervical region 02/12/2019  . Type 2 diabetes mellitus without complication, without long-term current use of insulin ([facility]) 05/15/2018   Rosana Hoes, PT, DPT, LAT, ATC 10/10/19  9:34 AM Phone: [phone] Fax: [phone]   [facility] Outpatient Rehabilitation [facility] [address] [place], [place], [address] Phone: [phone]   Fax:  [phone]  Name: Elaine Marquez MRN: [id number] Date of Birth: Jun 18, 1971

## 2019-10-15 ENCOUNTER — Ambulatory Visit: Payer: Medicaid Other | Admitting: Physical Therapy

## 2019-10-15 NOTE — Therapy (Signed)
[place], [place], [address] Phone: [phone]   Fax:  [phone]  Physical Therapy Evaluation  Patient Details  Name: Elaine Marquez MRN: [id number] Date of Birth: 1971-12-16 Referring Provider (PT): Rodell Perna, MD   Encounter Date: 09/26/2019    Past Medical History:  Diagnosis Date  . Asthma   . Diabetes mellitus without complication ([place])   . Hypertension   . Peritonsillar abscess     Past Surgical History:  Procedure Laterality Date  . TUBAL LIGATION      There were no vitals filed for this visit.                  Objective measurements completed on examination: See above findings.                   PT Long Term Goals - 09/26/19 1354      PT LONG TERM GOAL #1   Title  Independent with HEP    Baseline  needs HEP    Time  4    Period  Weeks    Status  New    Target Date  10/24/19      PT LONG TERM GOAL #2   Title  Increase right cervical rotation AROM at least 10 deg to improve ability to turn head while driving    Baseline  32 deg    Time  4    Period  Weeks    Status  New    Target Date  10/24/19      PT LONG TERM GOAL #3   Title  Tolerate sitting for periods at least 20-30 minutes for car travel, eating meals with pain decreased at least 25% from current status    Baseline  Pain 4/10 this AM but can reach 8-9/10    Time  4    Period  Weeks    Status  New    Target Date  10/24/19               Patient will benefit from skilled therapeutic intervention in order to improve the following deficits and impairments:  Pain, Impaired flexibility, Increased muscle spasms, Decreased range of motion  Visit Diagnosis: Cervicalgia - Plan: PT plan of care cert/re-cert     Problem List Patient Active Problem List   Diagnosis Date Noted  . Spondylosis without myelopathy or radiculopathy, cervical region 02/12/2019  . Type 2 diabetes  mellitus without complication, without long-term current use of insulin ([place]) 05/15/2018   Beaulah Dinning, PT, DPT 10/15/19 1:50 PM  [facility] [address] [place], [place], [address] Phone: [phone]   Fax:  [phone]  Name: Elaine Marquez MRN: [id number] Date of Birth: 16-Feb-1971

## 2019-10-20 ENCOUNTER — Ambulatory Visit: Payer: Medicaid Other | Attending: Orthopaedic Surgery | Admitting: Physical Therapy

## 2019-10-20 ENCOUNTER — Other Ambulatory Visit: Payer: Self-pay

## 2019-10-20 DIAGNOSIS — M542 Cervicalgia: Secondary | ICD-10-CM | POA: Insufficient documentation

## 2019-10-20 NOTE — Patient Instructions (Signed)

## 2019-10-20 NOTE — Therapy (Signed)
[facility] [address] [place], [place], [address] Phone: [phone]   Fax:  [phone]  Physical Therapy Treatment  Patient Details  Name: Elaine Marquez MRN: [id number] Date of Birth: 04-Nov-1971 Referring Provider (PT): Annell GreeningMark Yates, MD   Encounter Date: 10/20/2019  PT End of Session - 10/20/19 1054    Visit Number  3    Number of Visits  4    Date for PT Re-Evaluation  10/24/19    Authorization Type  Medicaid    Authorization Time Period  10/20-11/9    Authorization - Visit Number  2    Authorization - Number of Visits  3    PT Start Time  1020    Activity Tolerance  Patient tolerated treatment well    Behavior During Therapy  [facility] for tasks assessed/performed       Past Medical History:  Diagnosis Date  . Asthma   . Diabetes mellitus without complication (HCC)   . Hypertension   . Peritonsillar abscess     Past Surgical History:  Procedure Laterality Date  . TUBAL LIGATION      There were no vitals filed for this visit.  Subjective Assessment - 10/20/19 1052    Subjective  Pt. reports that she feels like her neck pain symptoms are a little better after last session. Still having some intermittent left UE radiating symptoms but reports mild ease of these as well.    Pertinent History  diabetic, asthma, HTN    Limitations  Sitting;House hold activities    Diagnostic tests  X-rays    Patient Stated Goals  Improve neck pain    Currently in Pain?  Yes    Pain Score  6     Pain Location  Neck    Pain Orientation  Left;Lower;Right    Pain Descriptors / Indicators  Sharp;Aching    Pain Type  Chronic pain    Pain Radiating Towards  intermittently to left arm    Pain Onset  More than a month ago    Pain Frequency  Constant    Aggravating Factors   cervical extension, turning head, lifting activities    Pain Relieving Factors  mild ease with medication    Effect of Pain on Daily Activities  impacts  positional tolerance and ability to turn head for driving, sleep disturbance                       [facility] Adult PT Treatment/Exercise - 10/20/19 0001      Modalities   Modalities  Traction      Traction   Type of Traction  Cervical    Min (lbs)  0    Max (lbs)  10   increased to 12 lbs. after 5 minutes   Hold Time  60 sec    Rest Time  20 sec    Time  10      Manual Therapy   Manual Therapy  Joint mobilization    Joint Mobilization  thoracic PAs in prone grade I-IIII   limited tolerance prone positioning   Soft tissue mobilization  cervical/CT junction paraspinals and left>right upper trapezius region       Trigger Point Dry Needling - 10/20/19 0001    Consent Given?  Yes    Education Handout Provided  Yes    Muscles Treated Head and Neck  Upper trapezius;Cervical multifidi   multifidi at C5-7 levels on left   Muscles Treated  Upper Quadrant  Infraspinatus    Dry Needling Comments  needling performed in right sidelying position due to difficulty tolerating prone, 32 gauge 30 mm needles used for upper trapezius and multifidi, 32 gauge 50 mm needle used for infraspinatus    Electrical Stimulation Performed with Dry Needling  Yes    E-stim with Dry Needling Details  TENS 20 pps x 10 minutes to left upper trapezius region           PT Education - 10/20/19 1054    Education Details  POC, dry needling    Person(s) Educated  Patient    Methods  Explanation;Verbal cues;Handout    Comprehension  Verbalized understanding          PT Long Term Goals - 09/26/19 1354      PT LONG TERM GOAL #1   Title  Independent with HEP    Baseline  needs HEP    Time  4    Period  Weeks    Status  New    Target Date  10/24/19      PT LONG TERM GOAL #2   Title  Increase right cervical rotation AROM at least 10 deg to improve ability to turn head while driving    Baseline  32 deg    Time  4    Period  Weeks    Status  New    Target Date  10/24/19      PT LONG  TERM GOAL #3   Title  Tolerate sitting for periods at least 20-30 minutes for car travel, eating meals with pain decreased at least 25% from current status    Baseline  Pain 4/10 this AM but can reach 8-9/10    Time  4    Period  Weeks    Status  New    Target Date  10/24/19            Plan - 10/20/19 1055    Clinical Impression Statement  Trial dry needling today to address myofascial component of pain in cervical, upper trapezius and periscapular region. Also trial mechanical cervical traction. Positioning for modified from prone for dry needling to sidelying given limited tolerance prone with thoracic PAs performed today. Mild improvement from baseline with neck pain and radicular symptoms but continues cervical pain and stiffness with intermittent radicular symptoms. Pt. would benefit from continued therapy for further progress to help relieve pain and address associated functional limitations.    Personal Factors and Comorbidities  Time since onset of injury/illness/exacerbation;Comorbidity 2    Comorbidities  diabetic, asthma    Examination-Activity Limitations  Lift;Sleep;Sit    Examination-Participation Restrictions  Laundry;Cleaning;Community Activity    Stability/Clinical Decision Making  Stable/Uncomplicated    Clinical Decision Making  Low    Rehab Potential  Good    PT Frequency  1x / week    PT Duration  4 weeks    PT Treatment/Interventions  ADLs/Self Care Home Management;Electrical Stimulation;Moist Heat;Traction;Ultrasound;Cryotherapy;Therapeutic exercise;Therapeutic activities;Functional mobility training;Neuromuscular re-education;Manual techniques;Dry needling;Spinal Manipulations;Taping;Passive range of motion;Joint Manipulations;Cognitive remediation    PT Next Visit Plan  check respone dry needling and continue as found beneficial, check response mechanical traction and continue/progress mechanical traction vs. further manual traction, cervical flexion bias ROM and  stretches, STM and modalities prn    PT Home Exercise Plan  Patient instructed to perform HEP as tolerated: seated cervical retraction, cervical flexion stretch, upper trap stretch, standing banded scap retractions, self STM using small ball to left upper trap and  posterior shoulder    Consulted and Agree with Plan of Care  Patient       Patient will benefit from skilled therapeutic intervention in order to improve the following deficits and impairments:  Pain, Impaired flexibility, Increased muscle spasms, Decreased range of motion, Postural dysfunction, Decreased strength, Decreased mobility  Visit Diagnosis: Cervicalgia     Problem List Patient Active Problem List   Diagnosis Date Noted  . Spondylosis without myelopathy or radiculopathy, cervical region 02/12/2019  . Type 2 diabetes mellitus without complication, without long-term current use of insulin (HCC) 05/15/2018    Lazarus Gowda, PT, DPT 10/20/19 11:04 AM  [facility] [address] [place], [place], [address] Phone: [phone]   Fax:  [phone]  Name: Elaine Marquez MRN: [id number] Date of Birth: [birth year]

## 2019-10-27 ENCOUNTER — Ambulatory Visit: Payer: Medicaid Other | Admitting: Physical Therapy

## 2019-10-31 ENCOUNTER — Ambulatory Visit: Payer: Medicaid Other | Admitting: Orthopaedic Surgery

## 2019-11-05 ENCOUNTER — Other Ambulatory Visit: Payer: Self-pay | Admitting: Family Medicine

## 2019-11-05 DIAGNOSIS — E119 Type 2 diabetes mellitus without complications: Secondary | ICD-10-CM

## 2019-11-12 ENCOUNTER — Telehealth: Payer: Self-pay | Admitting: Physical Therapy

## 2019-11-12 ENCOUNTER — Ambulatory Visit: Payer: Medicaid Other | Admitting: Physical Therapy

## 2019-11-12 NOTE — Telephone Encounter (Signed)
Called and spoke with patient regarding no show for therapy appointment this AM. She reports forgot appointment, will call back to reschedule.

## 2019-11-24 NOTE — Therapy (Signed)
[place], [place], [address] Phone: [phone]   Fax:  [phone]  Physical Therapy Treatment/Discharge  Patient Details  Name: Elaine Marquez MRN: [id number] Date of Birth: 06-22-1971 Referring Provider (PT): Rodell Perna, MD   Encounter Date: 10/20/2019    Past Medical History:  Diagnosis Date  . Asthma   . Diabetes mellitus without complication ([place])   . Hypertension   . Peritonsillar abscess     Past Surgical History:  Procedure Laterality Date  . TUBAL LIGATION      There were no vitals filed for this visit.                                 PT Long Term Goals - 09/26/19 1354      PT LONG TERM GOAL #1   Title  Independent with HEP    Baseline  needs HEP    Time  4    Period  Weeks    Status  New    Target Date  10/24/19      PT LONG TERM GOAL #2   Title  Increase right cervical rotation AROM at least 10 deg to improve ability to turn head while driving    Baseline  32 deg    Time  4    Period  Weeks    Status  New    Target Date  10/24/19      PT LONG TERM GOAL #3   Title  Tolerate sitting for periods at least 20-30 minutes for car travel, eating meals with pain decreased at least 25% from current status    Baseline  Pain 4/10 this AM but can reach 8-9/10    Time  4    Period  Weeks    Status  New    Target Date  10/24/19              Patient will benefit from skilled therapeutic intervention in order to improve the following deficits and impairments:  Pain, Impaired flexibility, Increased muscle spasms, Decreased range of motion, Postural dysfunction, Decreased strength, Decreased mobility  Visit Diagnosis: Cervicalgia     Problem List Patient Active Problem List   Diagnosis Date Noted  . Spondylosis without myelopathy or radiculopathy, cervical region 02/12/2019  . Type 2 diabetes mellitus without complication, without  long-term current use of insulin ([place]) 05/15/2018        PHYSICAL THERAPY DISCHARGE SUMMARY  Visits from Start of Care: 3  Current functional level related to goals / functional outcomes: Patient did not return for further therapy after last visit 10/20/19 with no show for last scheduled visit after, patient was contacted but has not rescheduled.   Remaining deficits: NA   Education / Equipment: NA Plan: Patient agrees to discharge.  Patient goals were not met. Patient is being discharged due to not returning since the last visit.  ?????            Beaulah Dinning, PT, DPT 11/24/19 3:32 PM      [facility] [address] [place], [place], [address] Phone: [phone]   Fax:  [phone]  Name: Elaine Marquez MRN: [id number] Date of Birth: 1971/02/13

## 2019-11-25 ENCOUNTER — Other Ambulatory Visit: Payer: Self-pay | Admitting: Family Medicine

## 2019-11-25 DIAGNOSIS — I10 Essential (primary) hypertension: Secondary | ICD-10-CM

## 2019-11-25 DIAGNOSIS — J4520 Mild intermittent asthma, uncomplicated: Secondary | ICD-10-CM

## 2019-11-25 DIAGNOSIS — E119 Type 2 diabetes mellitus without complications: Secondary | ICD-10-CM

## 2019-11-25 DIAGNOSIS — E1165 Type 2 diabetes mellitus with hyperglycemia: Secondary | ICD-10-CM

## 2019-12-01 ENCOUNTER — Other Ambulatory Visit: Payer: Self-pay | Admitting: Family Medicine

## 2019-12-01 DIAGNOSIS — E1165 Type 2 diabetes mellitus with hyperglycemia: Secondary | ICD-10-CM

## 2019-12-01 DIAGNOSIS — E119 Type 2 diabetes mellitus without complications: Secondary | ICD-10-CM

## 2019-12-05 ENCOUNTER — Other Ambulatory Visit: Payer: Self-pay | Admitting: Family Medicine

## 2019-12-05 DIAGNOSIS — E119 Type 2 diabetes mellitus without complications: Secondary | ICD-10-CM

## 2019-12-15 ENCOUNTER — Telehealth: Payer: Self-pay | Admitting: Family Medicine

## 2019-12-15 DIAGNOSIS — E1165 Type 2 diabetes mellitus with hyperglycemia: Secondary | ICD-10-CM

## 2019-12-15 DIAGNOSIS — E119 Type 2 diabetes mellitus without complications: Secondary | ICD-10-CM

## 2019-12-15 MED ORDER — GLIMEPIRIDE 4 MG PO TABS: 4.0000 mg | ORAL_TABLET | Freq: Every day | ORAL | 0 refills | Status: DC

## 2019-12-15 MED ORDER — ACCU-CHEK AVIVA PLUS VI STRP: 1.0000 | ORAL_STRIP | Freq: Three times a day (TID) | 0 refills | Status: DC

## 2019-12-15 MED ORDER — METFORMIN HCL ER 500 MG PO TB24: ORAL_TABLET | ORAL | 0 refills | Status: DC

## 2019-12-15 NOTE — Telephone Encounter (Signed)
1) Medication(s) Requested (by name):ACCU-CHEK AVIVA PLUS test strip [[id number]]  glimepiride (AMARYL) 4 MG tablet [[id number] metFORMIN (GLUCOPHAGE-XR) 500 MG 24 hr tablet [[id number]]    2) [facility]   3) Special Requests: please one time fill until appointment on the 6th of jan.    Approved medications will be sent to the pharmacy, we will reach out if there is an issue.  Requests made after 3pm may not be addressed until the following business day!  If a patient is unsure of the name of the medication(s) please note and ask patient to call back when they are able to provide all info, do not send to responsible party until all information is available!

## 2019-12-15 NOTE — Telephone Encounter (Signed)
Refill sent for 1 month

## 2019-12-24 ENCOUNTER — Ambulatory Visit: Payer: Medicaid Other | Attending: Family Medicine | Admitting: Physician Assistant

## 2019-12-24 VITALS — BP 123/85 | HR 83 | Temp 99.3°F | Wt 249.0 lb

## 2019-12-24 DIAGNOSIS — I10 Essential (primary) hypertension: Secondary | ICD-10-CM | POA: Diagnosis not present

## 2019-12-24 DIAGNOSIS — J4520 Mild intermittent asthma, uncomplicated: Secondary | ICD-10-CM

## 2019-12-24 DIAGNOSIS — Z9119 Patient's noncompliance with other medical treatment and regimen: Secondary | ICD-10-CM

## 2019-12-24 DIAGNOSIS — E1165 Type 2 diabetes mellitus with hyperglycemia: Secondary | ICD-10-CM

## 2019-12-24 DIAGNOSIS — E119 Type 2 diabetes mellitus without complications: Secondary | ICD-10-CM

## 2019-12-24 DIAGNOSIS — M47812 Spondylosis without myelopathy or radiculopathy, cervical region: Secondary | ICD-10-CM

## 2019-12-24 DIAGNOSIS — Z91199 Patient's noncompliance with other medical treatment and regimen due to unspecified reason: Secondary | ICD-10-CM

## 2019-12-24 LAB — POCT GLYCOSYLATED HEMOGLOBIN (HGB A1C): Hemoglobin A1C: 13.7 % — AB (ref 4.0–5.6)

## 2019-12-24 LAB — GLUCOSE, POCT (MANUAL RESULT ENTRY)
POC Glucose: 299 mg/dl — AB (ref 70–99)
POC Glucose: 321 mg/dl — AB (ref 70–99)

## 2019-12-24 MED ORDER — ALBUTEROL SULFATE HFA 108 (90 BASE) MCG/ACT IN AERS: INHALATION_SPRAY | RESPIRATORY_TRACT | 1 refills | Status: DC

## 2019-12-24 MED ORDER — INSULIN ASPART 100 UNIT/ML ~~LOC~~ SOLN
15.0000 [IU] | Freq: Once | SUBCUTANEOUS | Status: AC
  Administered 2019-12-24: 15 [IU] via SUBCUTANEOUS

## 2019-12-24 MED ORDER — METFORMIN HCL ER 500 MG PO TB24: ORAL_TABLET | ORAL | 0 refills | Status: DC

## 2019-12-24 MED ORDER — TRAMADOL HCL 50 MG PO TABS: 50.0000 mg | ORAL_TABLET | Freq: Two times a day (BID) | ORAL | 0 refills | Status: DC | PRN

## 2019-12-24 MED ORDER — ATENOLOL 25 MG PO TABS: 25.0000 mg | ORAL_TABLET | Freq: Every day | ORAL | 4 refills | Status: DC

## 2019-12-24 MED ORDER — GLIMEPIRIDE 4 MG PO TABS: 4.0000 mg | ORAL_TABLET | Freq: Every day | ORAL | 3 refills | Status: DC

## 2019-12-24 NOTE — Progress Notes (Signed)
Pt. Is requesting medication refill on Tramadol.

## 2019-12-24 NOTE — Progress Notes (Signed)
Elaine Marquez, is a 49 y.o. female  LKJ:[id number]  VXY:[id number]  DOB - December 22, 1970  Subjective:  Chief Complaint and HPI: Elaine Marquez is a 49 y.o. female here today for med RF.  After lengthy discussion she admits to not taking glimepiride for the last few months and "sometimes" skips metformin.   She does not check her blood sugars regularly.  She takes a phone call while I am in the room.  She also tries to leave before we can recheck her blood sugar after novolog.  She does not adhere to diabetic diet.  Has ongoing issues with back pain.  usu takes tylenol but a couple of times a week she needs tramadol for it.  No UTI s/sx.    ROS:   Constitutional:  No f/c, No night sweats, No unexplained weight loss. EENT:  No vision changes, No blurry vision, No hearing changes. No mouth, throat, or ear problems.  Respiratory: No cough, No SOB Cardiac: No CP, no palpitations GI:  No abd pain, No N/V/D. GU: No Urinary s/sx Musculoskeletal: back pain Neuro: No headache, no dizziness, no motor weakness.  Skin: No rash Endocrine:  No polydipsia. No polyuria.  Psych: Denies SI/HI  No problems updated.  ALLERGIES: Allergies  Allergen Reactions  . Ibuprofen Nausea And Vomiting    PAST MEDICAL HISTORY: Past Medical History:  Diagnosis Date  . Asthma   . Diabetes mellitus without complication ([place])   . Hypertension   . Peritonsillar abscess     MEDICATIONS AT HOME: Prior to Admission medications   Medication Sig Start Date End Date Taking? Authorizing Provider  Accu-Chek FastClix Lancets MISC TEST THREE TIMES DAILY AS DIRECTED 05/15/19  Yes Fulp, Cammie, MD  albuterol (PROVENTIL) (2.5 MG/3ML) 0.083% nebulizer solution Take 3 mLs (2.5 mg total) by nebulization every 6 (six) hours as needed for wheezing. 08/11/13  Yes Antonietta Breach, PA-C  albuterol (VENTOLIN HFA) 108 (90 Base) MCG/ACT inhaler Inhale 2 puffs into the lungs every 6 (six) hours as needed for wheezing or shortness of  breath. 12/24/19  Yes Freeman Caldron M, PA-C  atenolol (TENORMIN) 25 MG tablet Take 1 tablet (25 mg total) by mouth daily. 12/24/19  Yes Freeman Caldron M, PA-C  glimepiride (AMARYL) 4 MG tablet Take 1 tablet (4 mg total) by mouth daily before breakfast. 12/24/19  Yes Stepehn Eckard M, PA-C  glucose blood (ACCU-CHEK AVIVA PLUS) test strip 1 each by Other route 3 (three) times daily. Use as instructed 12/15/19  Yes Fulp, Cammie, MD  Lancets Misc. (ACCU-CHEK FASTCLIX LANCET) KIT Check blood glucose at least 3 times daily. 09/12/18  Yes Fulp, Cammie, MD  metFORMIN (GLUCOPHAGE-XR) 500 MG 24 hr tablet Take 1 tablet by mouth in the morning with breakfast and 2 by mouth in the evening with supper. 12/24/19  Yes Darriel Utter, Dionne Bucy, PA-C  traMADol (ULTRAM) 50 MG tablet Take 1 tablet (50 mg total) by mouth every 12 (twelve) hours as needed. 12/24/19  Yes Hoyte Ziebell M, PA-C  benzonatate (TESSALON) 100 MG capsule Take 1 capsule (100 mg total) by mouth every 8 (eight) hours. Patient not taking: Reported on 09/12/2018 11/30/14   Noemi Chapel, MD  hydrOXYzine (ATARAX/VISTARIL) 25 MG tablet Take 1 tablet (25 mg total) by mouth every 6 (six) hours. Patient not taking: Reported on 01/28/2019 09/13/17   Etta Quill, NP  methocarbamol (ROBAXIN) 500 MG tablet Take 1 tablet (500 mg total) by mouth every 8 (eight) hours as needed for muscle spasms. Patient not taking: Reported  on 09/02/2019 11/05/18   Fulp, Cammie, MD  terbinafine (LAMISIL) 1 % cream Apply 1 application topically 2 (two) times daily. Patient not taking: Reported on 09/12/2018 09/13/17   Etta Quill, NP     Objective:  EXAM:   Vitals:   12/24/19 1426  BP: 123/85  Pulse: 83  Temp: 99.3 F (37.4 C)  TempSrc: Oral  SpO2: 98%  Weight: 249 lb (112.9 kg)    General appearance : A&OX3. NAD. Non-toxic-appearing HEENT: Atraumatic and Normocephalic.  PERRLA. EOM intact.  Chest/Lungs:  Breathing-non-labored, Good air entry bilaterally, breath sounds normal  without rales, rhonchi, or wheezing  CVS: S1 S2 regular, no murmurs, gallops, rubs  Extremities: Bilateral Lower Ext shows no edema, both legs are warm to touch with = pulse throughout Neurology:  CN II-XII grossly intact, Non focal.   Psych:  TP linear. J/I poor. Normal speech. Appropriate eye contact and affect.  Skin:  No Rash  Data Review Lab Results  Component Value Date   HGBA1C 13.7 (A) 12/24/2019   HGBA1C 9.4 (H) 01/28/2019   HGBA1C 12.2 (A) 09/12/2018     Assessment & Plan   1. Type 2 diabetes mellitus without complication, without long-term current use of insulin (HCC) Not controlled.  Not compliant.  Discussed proper diet and compliance at length.  Resume all meds - Glucose (CBG) - HgB A1c - insulin aspart (novoLOG) injection 15 Units  2. Uncontrolled type 2 diabetes mellitus with hyperglycemia (HCC) See#1 - glimepiride (AMARYL) 4 MG tablet; Take 1 tablet (4 mg total) by mouth daily before breakfast.  Dispense: 30 tablet; Refill: 3 - albuterol (VENTOLIN HFA) 108 (90 Base) MCG/ACT inhaler; Inhale 2 puffs into the lungs every 6 (six) hours as needed for wheezing or shortness of breath.  Dispense: 8.5 g; Refill: 1 - Comprehensive metabolic panel - CBC with Differential - Lipid panel - metFORMIN (GLUCOPHAGE-XR) 500 MG 24 hr tablet; Take 1 tablet by mouth in the morning with breakfast and 2 by mouth in the evening with supper.  Dispense: 90 tablet; Refill: 0  3. Essential hypertension controlled - atenolol (TENORMIN) 25 MG tablet; Take 1 tablet (25 mg total) by mouth daily.  Dispense: 30 tablet; Refill: 4  4. Mild intermittent asthma without complication - albuterol (VENTOLIN HFA) 108 (90 Base) MCG/ACT inhaler; Inhale 2 puffs into the lungs every 6 (six) hours as needed for wheezing or shortness of breath.  Dispense: 8.5 g; Refill: 1  5. Spondylosis without myelopathy or radiculopathy, cervical region  - traMADol (ULTRAM) 50 MG tablet; Take 1 tablet (50 mg total) by  mouth every 12 (twelve) hours as needed.  Dispense: 15 tablet; Refill: 0  6. Noncompliance Discussed at length.     Patient have been counseled extensively about nutrition and exercise  Return in about 3 weeks (around 01/14/2020) for 3 weeks with Lurena Joiner for DM management;  3 months PCP.  The patient was given clear instructions to go to ER or return to medical center if symptoms don't improve, worsen or new problems develop. The patient verbalized understanding. The patient was told to call to get lab results if they haven't heard anything in the next week.     Freeman Caldron, PA-C [facility] [place], [place]   12/24/2019, 2:56 PMPatient ID: Elaine Marquez, female   DOB: 04/26/1971, 49 y.o.   MRN: [id number]

## 2019-12-24 NOTE — Patient Instructions (Signed)
Check blood sugars at least twice daily and bring to next visit

## 2019-12-25 LAB — COMPREHENSIVE METABOLIC PANEL
ALT: 12 IU/L (ref 0–32)
AST: 10 IU/L (ref 0–40)
Albumin/Globulin Ratio: 1.7 (ref 1.2–2.2)
Albumin: 4.7 g/dL (ref 3.8–4.8)
Alkaline Phosphatase: 33 IU/L — ABNORMAL LOW (ref 39–117)
BUN/Creatinine Ratio: 19 (ref 9–23)
BUN: 13 mg/dL (ref 6–24)
Bilirubin Total: 0.3 mg/dL (ref 0.0–1.2)
CO2: 23 mmol/L (ref 20–29)
Calcium: 10.6 mg/dL — ABNORMAL HIGH (ref 8.7–10.2)
Chloride: 98 mmol/L (ref 96–106)
Creatinine, Ser: 0.69 mg/dL (ref 0.57–1.00)
GFR calc Af Amer: 118 mL/min/{1.73_m2} (ref >59)
GFR calc non Af Amer: 103 mL/min/{1.73_m2} (ref >59)
Globulin, Total: 2.7 g/dL (ref 1.5–4.5)
Glucose: 312 mg/dL — ABNORMAL HIGH (ref 65–99)
Potassium: 4.8 mmol/L (ref 3.5–5.2)
Sodium: 137 mmol/L (ref 134–144)
Total Protein: 7.4 g/dL (ref 6.0–8.5)

## 2019-12-25 LAB — LIPID PANEL
Chol/HDL Ratio: 1.8 ratio (ref 0.0–4.4)
Cholesterol, Total: 138 mg/dL (ref 100–199)
HDL: 78 mg/dL (ref >39)
LDL Chol Calc (NIH): 44 mg/dL (ref 0–99)
Triglycerides: 83 mg/dL (ref 0–149)
VLDL Cholesterol Cal: 16 mg/dL (ref 5–40)

## 2019-12-25 LAB — CBC WITH DIFFERENTIAL/PLATELET
Basophils Absolute: 0.1 10*3/uL (ref 0.0–0.2)
Basos: 1 %
EOS (ABSOLUTE): 0.2 10*3/uL (ref 0.0–0.4)
Eos: 2 %
Hematocrit: 45.9 % (ref 34.0–46.6)
Hemoglobin: 14.7 g/dL (ref 11.1–15.9)
Immature Grans (Abs): 0 10*3/uL (ref 0.0–0.1)
Immature Granulocytes: 0 %
Lymphocytes Absolute: 3.3 10*3/uL — ABNORMAL HIGH (ref 0.7–3.1)
Lymphs: 31 %
MCH: 29.5 pg (ref 26.6–33.0)
MCHC: 32 g/dL (ref 31.5–35.7)
MCV: 92 fL (ref 79–97)
Monocytes Absolute: 0.5 10*3/uL (ref 0.1–0.9)
Monocytes: 5 %
Neutrophils Absolute: 6.5 10*3/uL (ref 1.4–7.0)
Neutrophils: 61 %
Platelets: 404 10*3/uL (ref 150–450)
RBC: 4.98 x10E6/uL (ref 3.77–5.28)
RDW: 13.9 % (ref 11.7–15.4)
WBC: 10.6 10*3/uL (ref 3.4–10.8)

## 2019-12-26 ENCOUNTER — Telehealth (INDEPENDENT_AMBULATORY_CARE_PROVIDER_SITE_OTHER): Payer: Self-pay

## 2019-12-26 NOTE — Telephone Encounter (Signed)
-----   Message from Anders Simmonds, [place] sent at 12/25/2019  3:03 PM EST ----- Please call patient.  Labs other than blood sugars are normal.  Follow-up as planned.  Thanks, Georgian Co, PA-C

## 2019-12-26 NOTE — Telephone Encounter (Signed)
Patient verified date of birth. She is aware that labs are good other than blood sugars. Reminded patient of upcoming appointments. She asked what her A1c was, informed that is was 13.7. patient states she is taking all diabetic medications now. Maryjean Morn, CMA

## 2020-01-01 ENCOUNTER — Other Ambulatory Visit: Payer: Self-pay | Admitting: Family Medicine

## 2020-01-01 DIAGNOSIS — E119 Type 2 diabetes mellitus without complications: Secondary | ICD-10-CM

## 2020-01-14 ENCOUNTER — Ambulatory Visit: Payer: Medicaid Other | Admitting: Pharmacist

## 2020-02-04 ENCOUNTER — Other Ambulatory Visit: Payer: Self-pay | Admitting: Physician Assistant

## 2020-02-04 ENCOUNTER — Other Ambulatory Visit: Payer: Self-pay | Admitting: Family Medicine

## 2020-02-04 DIAGNOSIS — E119 Type 2 diabetes mellitus without complications: Secondary | ICD-10-CM

## 2020-02-04 DIAGNOSIS — E1165 Type 2 diabetes mellitus with hyperglycemia: Secondary | ICD-10-CM

## 2020-03-15 ENCOUNTER — Telehealth: Payer: Self-pay | Admitting: Family Medicine

## 2020-03-15 NOTE — Telephone Encounter (Signed)
Patient stopped by the office and stated that she needed a battery for her glucometer. Patient stated that she has been unable to find one at [facility]. Please follow up at your earliest convenience.

## 2020-03-16 NOTE — Telephone Encounter (Signed)
LMOM

## 2020-03-16 NOTE — Telephone Encounter (Signed)
Please find out from patient where she obtained her glucometer and if she obtained the glucometer through her insurance as she may need to call her insurance company or the place where she obtained the glucometer about a replacement battery

## 2020-03-24 ENCOUNTER — Ambulatory Visit: Payer: Medicaid Other | Admitting: Family Medicine

## 2020-03-24 NOTE — Telephone Encounter (Signed)
LMOM

## 2020-04-12 ENCOUNTER — Other Ambulatory Visit: Payer: Self-pay | Admitting: Physician Assistant

## 2020-04-12 DIAGNOSIS — I10 Essential (primary) hypertension: Secondary | ICD-10-CM

## 2020-04-14 ENCOUNTER — Other Ambulatory Visit: Payer: Self-pay | Admitting: Physician Assistant

## 2020-04-14 DIAGNOSIS — E1165 Type 2 diabetes mellitus with hyperglycemia: Secondary | ICD-10-CM

## 2020-05-12 ENCOUNTER — Other Ambulatory Visit: Payer: Self-pay | Admitting: Family Medicine

## 2020-05-12 DIAGNOSIS — E119 Type 2 diabetes mellitus without complications: Secondary | ICD-10-CM

## 2020-05-12 DIAGNOSIS — E1165 Type 2 diabetes mellitus with hyperglycemia: Secondary | ICD-10-CM

## 2020-05-21 ENCOUNTER — Other Ambulatory Visit: Payer: Self-pay | Admitting: Physician Assistant

## 2020-05-21 ENCOUNTER — Other Ambulatory Visit: Payer: Self-pay | Admitting: Family Medicine

## 2020-05-21 DIAGNOSIS — E1165 Type 2 diabetes mellitus with hyperglycemia: Secondary | ICD-10-CM

## 2020-05-21 DIAGNOSIS — E119 Type 2 diabetes mellitus without complications: Secondary | ICD-10-CM

## 2020-05-21 DIAGNOSIS — J4520 Mild intermittent asthma, uncomplicated: Secondary | ICD-10-CM

## 2020-06-21 ENCOUNTER — Other Ambulatory Visit: Payer: Self-pay | Admitting: Physician Assistant

## 2020-06-21 ENCOUNTER — Other Ambulatory Visit: Payer: Self-pay | Admitting: Family Medicine

## 2020-06-21 DIAGNOSIS — I10 Essential (primary) hypertension: Secondary | ICD-10-CM

## 2020-06-21 DIAGNOSIS — E119 Type 2 diabetes mellitus without complications: Secondary | ICD-10-CM

## 2020-06-21 DIAGNOSIS — E1165 Type 2 diabetes mellitus with hyperglycemia: Secondary | ICD-10-CM

## 2020-06-21 DIAGNOSIS — J4520 Mild intermittent asthma, uncomplicated: Secondary | ICD-10-CM

## 2020-06-22 NOTE — Telephone Encounter (Signed)
Requested medication (s) are due for refill today: yes  Requested medication (s) are on the active medication list: yes  Last refill: 05/12/2020  Future visit scheduled: not  Notes to clinic: Patient had been given courtesy refill  Follow up has not been schedule Please advise for refills   Requested Prescriptions  Pending Prescriptions Disp Refills   Accu-Chek FastClix Lancets MISC [Pharmacy Med Name: Accu-Chek Fastclix Lancet [place]] 102 each 12    Sig: TEST [place]      Endocrinology: Diabetes - Testing Supplies Passed - 06/21/2020 12:19 PM      Passed - Valid encounter within last 12 months    Recent Outpatient Visits           6 months ago Type 2 diabetes mellitus without complication, without long-term current use of insulin [facility])   [place], [place], [place]   1 year ago Uncontrolled type 2 diabetes mellitus with hyperglycemia ([place])   [facility] Fulp, [place], MD   1 year ago Uncontrolled type 2 diabetes mellitus with hyperglycemia ([place])   [facility] Fulp, [place], MD   1 year ago Type 2 diabetes mellitus without complication, without long-term current use of insulin ([place])   [place], [place]   1 year ago Type 2 diabetes mellitus without complication, without long-term current use of insulin ([place])   [place], Stephen L, RPH-CPP                atenolol (TENORMIN) 25 MG tablet [Pharmacy Med Name: atenolol 25 mg tablet] 30 tablet 0    Sig: Take 1 tablet (25 mg total) by mouth daily. Must have office visit for refills      Cardiovascular:  Beta Blockers Passed - 06/21/2020 12:19 PM      Passed - Last BP in normal range    BP Readings from Last 1 Encounters:  12/24/19 123/85          Passed - Last Heart Rate in normal range    Pulse Readings from Last 1 Encounters:   12/24/19 83          Passed - Valid encounter within last 6 months    Recent Outpatient Visits           6 months ago Type 2 diabetes mellitus without complication, without long-term current use of insulin [facility])   [place], [place], [place]   1 year ago Uncontrolled type 2 diabetes mellitus with hyperglycemia ([place])   [facility] Fulp, [place], MD   1 year ago Uncontrolled type 2 diabetes mellitus with hyperglycemia ([place])   [facility] Fulp, [place], MD   1 year ago Type 2 diabetes mellitus without complication, without long-term current use of insulin ([place])   [place], MD   1 year ago Type 2 diabetes mellitus without complication, without long-term current use of insulin ([place])   [place], Stephen L, RPH-CPP                glimepiride (AMARYL) 4 MG tablet [Pharmacy Med Name: glimepiride 4 mg tablet] 30 tablet 0    Sig: Take 1 tablet (4 mg total) by mouth daily before breakfast.  Endocrinology:  Diabetes - Sulfonylureas Failed - 06/21/2020 12:19 PM      Failed - HBA1C is between 0 and 7.9 and within 180 days    Hemoglobin A1C  Date Value Ref Range Status  12/24/2019 13.7 (A) 4.0 - 5.6 % Final   HbA1c, POC (controlled diabetic range)  Date Value Ref Range Status  09/12/2018 12.2 (A) 0.0 - 7.0 % Final   Hgb A1c MFr Bld  Date Value Ref Range Status  01/28/2019 9.4 (H) 4.8 - 5.6 % Final    Comment:             Prediabetes: 5.7 - 6.4          Diabetes: >6.4          Glycemic control for adults with diabetes: <7.0           Passed - Valid encounter within last 6 months    Recent Outpatient Visits           6 months ago Type 2 diabetes mellitus without complication, without long-term current use of insulin ([place])   [place], [place], [place]    1 year ago Uncontrolled type 2 diabetes mellitus with hyperglycemia ([place])   [place] [facility] Fulp, [place], MD   1 year ago Uncontrolled type 2 diabetes mellitus with hyperglycemia ([place])   [place] Fulp, [place], MD   1 year ago Type 2 diabetes mellitus without complication, without long-term current use of insulin ([place])   [place] Fulp, [place], MD   1 year ago Type 2 diabetes mellitus without complication, without long-term current use of insulin ([place])   [place], Stephen L, RPH-CPP                metFORMIN (GLUCOPHAGE-XR) 500 MG 24 hr tablet [Pharmacy Med Name: metformin ER 500 mg tablet,extended release 24 hr] 90 tablet 0    Sig: Take 1 tablet by mouth in the morning with breakfast and 2 by mouth in the evening with supper.      Endocrinology:  Diabetes - Biguanides Failed - 06/21/2020 12:19 PM      Failed - HBA1C is between 0 and 7.9 and within 180 days    Hemoglobin A1C  Date Value Ref Range Status  12/24/2019 13.7 (A) 4.0 - 5.6 % Final   HbA1c, POC (controlled diabetic range)  Date Value Ref Range Status  09/12/2018 12.2 (A) 0.0 - 7.0 % Final   Hgb A1c MFr Bld  Date Value Ref Range Status  01/28/2019 9.4 (H) 4.8 - 5.6 % Final    Comment:             Prediabetes: 5.7 - 6.4          Diabetes: >6.4          Glycemic control for adults with diabetes: <7.0           Passed - Cr in normal range and within 360 days    Creatinine, Ser  Date Value Ref Range Status  12/24/2019 0.69 0.57 - 1.00 mg/dL Final          Passed - eGFR in normal range and within 360 days    GFR calc Af Amer  Date Value Ref Range Status  12/24/2019 118 >59 mL/min/1.73 Final   GFR calc non Af Amer  Date Value Ref Range Status  12/24/2019 103 >59 mL/min/1.73 Final          Passed - Valid encounter within last 6 months    Recent Outpatient Visits           6  months ago Type 2 diabetes mellitus without complication, without long-term current use of insulin [facility])   [place], [place], [place]   1 year ago Uncontrolled type 2 diabetes mellitus with hyperglycemia ([place])   [place] Fulp, [place], MD   1 year ago Uncontrolled type 2 diabetes mellitus with hyperglycemia ([place])   [place] Fulp, [place], MD   1 year ago Type 2 diabetes mellitus without complication, without long-term current use of insulin ([place])   [place] Fulp, [place], MD   1 year ago Type 2 diabetes mellitus without complication, without long-term current use of insulin ([place])   [place], Stephen L, RPH-CPP                ACCU-CHEK AVIVA PLUS test strip [place] Med Name: Accu-Chek Aviva Plus test strips] 100 strip 0    Sig: 1 each by Other route 3 (three) times daily. Use as instructed      Endocrinology: Diabetes - Testing Supplies Passed - 06/21/2020 12:19 PM      Passed - Valid encounter within last 12 months    Recent Outpatient Visits           6 months ago Type 2 diabetes mellitus without complication, without long-term current use of insulin [facility])   [place], [place], [place]   1 year ago Uncontrolled type 2 diabetes mellitus with hyperglycemia ([place])   [place] Fulp, [place], MD   1 year ago Uncontrolled type 2 diabetes mellitus with hyperglycemia ([place])   [place] Fulp, [place], MD   1 year ago Type 2 diabetes mellitus without complication, without long-term current use of insulin ([place])   [place], MD   1 year ago Type 2 diabetes mellitus without complication, without long-term current use of insulin [facility])   [place], RPH-CPP

## 2020-06-29 ENCOUNTER — Other Ambulatory Visit: Payer: Self-pay | Admitting: Family Medicine

## 2020-06-29 DIAGNOSIS — I10 Essential (primary) hypertension: Secondary | ICD-10-CM

## 2020-06-29 DIAGNOSIS — E119 Type 2 diabetes mellitus without complications: Secondary | ICD-10-CM

## 2020-06-29 DIAGNOSIS — E1165 Type 2 diabetes mellitus with hyperglycemia: Secondary | ICD-10-CM

## 2020-07-30 ENCOUNTER — Other Ambulatory Visit: Payer: Self-pay

## 2020-07-30 ENCOUNTER — Other Ambulatory Visit: Payer: Self-pay | Admitting: Physician Assistant

## 2020-07-30 ENCOUNTER — Ambulatory Visit: Payer: Medicaid Other | Attending: Internal Medicine | Admitting: Internal Medicine

## 2020-07-30 ENCOUNTER — Encounter: Payer: Self-pay | Admitting: Internal Medicine

## 2020-07-30 ENCOUNTER — Ambulatory Visit (HOSPITAL_BASED_OUTPATIENT_CLINIC_OR_DEPARTMENT_OTHER): Payer: Medicaid Other | Admitting: Pharmacist

## 2020-07-30 VITALS — BP 135/97 | HR 93 | Temp 98.2°F | Resp 16 | Ht 64.0 in | Wt 260.2 lb

## 2020-07-30 DIAGNOSIS — J4520 Mild intermittent asthma, uncomplicated: Secondary | ICD-10-CM

## 2020-07-30 DIAGNOSIS — I10 Essential (primary) hypertension: Secondary | ICD-10-CM

## 2020-07-30 DIAGNOSIS — E1165 Type 2 diabetes mellitus with hyperglycemia: Secondary | ICD-10-CM

## 2020-07-30 DIAGNOSIS — E119 Type 2 diabetes mellitus without complications: Secondary | ICD-10-CM | POA: Diagnosis not present

## 2020-07-30 DIAGNOSIS — Z23 Encounter for immunization: Secondary | ICD-10-CM

## 2020-07-30 DIAGNOSIS — M47812 Spondylosis without myelopathy or radiculopathy, cervical region: Secondary | ICD-10-CM

## 2020-07-30 DIAGNOSIS — Z6841 Body Mass Index (BMI) 40.0 and over, adult: Secondary | ICD-10-CM | POA: Diagnosis not present

## 2020-07-30 LAB — POCT GLYCOSYLATED HEMOGLOBIN (HGB A1C): HbA1c, POC (controlled diabetic range): 10.7 % — AB (ref 0.0–7.0)

## 2020-07-30 LAB — GLUCOSE, POCT (MANUAL RESULT ENTRY): POC Glucose: 367 mg/dl — AB (ref 70–99)

## 2020-07-30 MED ORDER — GLIMEPIRIDE 4 MG PO TABS: 4.0000 mg | ORAL_TABLET | Freq: Every day | ORAL | 6 refills | Status: DC

## 2020-07-30 MED ORDER — METFORMIN HCL ER 500 MG PO TB24: ORAL_TABLET | ORAL | 6 refills | Status: DC

## 2020-07-30 MED ORDER — TRULICITY 0.75 MG/0.5ML ~~LOC~~ SOAJ: 0.7500 mg | SUBCUTANEOUS | 5 refills | Status: DC

## 2020-07-30 MED ORDER — LOSARTAN POTASSIUM 25 MG PO TABS: 25.0000 mg | ORAL_TABLET | Freq: Every day | ORAL | 6 refills | Status: DC

## 2020-07-30 MED ORDER — ATENOLOL 25 MG PO TABS: 25.0000 mg | ORAL_TABLET | Freq: Every day | ORAL | 6 refills | Status: DC

## 2020-07-30 MED ORDER — TRAMADOL HCL 50 MG PO TABS: 50.0000 mg | ORAL_TABLET | Freq: Two times a day (BID) | ORAL | 0 refills | Status: DC | PRN

## 2020-07-30 NOTE — Progress Notes (Signed)
Patient ID: Elaine Marquez, female    DOB: 19-Nov-1971  MRN: [id number]  CC: Diabetes, Hypertension, and Medication Refill   Subjective: Elaine Marquez is a 49 y.o. female who presents for chronic ds management and med RF.  PCP is Dr. Chapman Fitch who is out of office on extended leave. 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 DIABETES TYPE 2 Last A1C:   Results for orders placed or performed in visit on 07/30/20  POCT glucose (manual entry)  Result Value Ref Range   POC Glucose 367 (A) 70 - 99 mg/dl  POCT glycosylated hemoglobin (Hb A1C)  Result Value Ref Range   Hemoglobin A1C     HbA1c POC (<> result, manual entry)     HbA1c, POC (prediabetic range)     HbA1c, POC (controlled diabetic range) 10.7 (A) 0.0 - 7.0 %   A1C has improved from 13.7 Med Adherence:  _0  Yes  -reports she has been more consistent with taking med Medication side effects:  _1  Yes    _2  No Home Monitoring?  _3  Yes But not often Home glucose results range: Diet Adherence: "I need to cut down on the carbs.  Drinks diet drinks and water.  Also has a lot going on personally.  Husband has prostate CA, daughter came to live with her and will be having a bady in the next several wks Exercise: _4  Yes    _5  No but does have a gym membership which she plans to start utilizing Hypoglycemic episodes?: _6  Yes    _7  No Numbness of the feet? _8  Yes   RT heel and medial aspect RT foot Retinopathy hx? _9  Yes    _10  No Last eye exam: over due.  Will call to schedule an  appt with her ophthalmologist Comments:   HYPERTENSION Currently taking: see medication list.  On Atenolol Med Adherence: _11  Yes and took already for today    _12  No Medication side effects: _13  Yes    _14  No Adherence with salt restriction: _15  Yes    _16  No Home Monitoring?: _17  Yes    _18   No Monitoring Frequency: _19  Yes    _20  No Home BP results range: _21  Yes    _22  No SOB? _23  Yes    _24  No Chest Pain?: _25  Yes    _26  No Leg swelling?: _27  Yes    _28  No Headaches?: _29  Yes    _30  No Dizziness? _31  Yes    _32  No Comments:   C/o intermittent flare up of neck pain and request RF on Tramadol.  Does not tolerate NSAIDs.  Does not have to take Tramadol often.  In the past she would be give 15 tabs which would last a while. No radiation.  No Numbness or tingling.  HM:  Had Moderna COVID vaccine.  Due for PAP, Tdap and Pneumovax Patient Active Problem List   Diagnosis Date Noted  . Spondylosis without myelopathy or radiculopathy, cervical region 02/12/2019  . Type 2 diabetes mellitus without complication, without long-term current use of insulin ([place]) 05/15/2018     Current Outpatient Medications on File Prior to Visit  Medication Sig Dispense Refill  . ACCU-CHEK AVIVA PLUS test strip 1 each by Other route 3 (three) times daily. Use as instructed 100 strip 0  . Accu-Chek FastClix Lancets MISC TEST THREE TIMES DAILY AS DIRECTED 102 each 12  .  albuterol (PROVENTIL) (2.5 MG/3ML) 0.083% nebulizer solution Take 3 mLs (2.5 mg total) by nebulization every 6 (six) hours as needed for wheezing. 75 mL 0  . Lancets Misc. (ACCU-CHEK FASTCLIX LANCET) KIT Check blood glucose at least 3 times daily. 1 kit 6   No current facility-administered medications on file prior to visit.    Allergies  Allergen Reactions  . Ibuprofen Nausea And Vomiting    Social History   Socioeconomic History  . Marital status: Unknown    Spouse name: Not on file  . Number of children: Not on file  . Years of education: Not on file  . Highest education level: Not on file  Occupational History  . Not on file  Tobacco Use  . Smoking status: Never Smoker  . Smokeless tobacco: Never Used  Substance and Sexual Activity  . Alcohol use: Yes    Comment: occasionally   . Drug use: No  . Sexual activity: Not on  file  Other Topics Concern  . Not on file  Social History Narrative  . Not on file   Social Determinants of Health   Financial Resource Strain:   . Difficulty of Paying Living Expenses:   Food Insecurity:   . Worried About Charity fundraiser in the Last Year:   . Arboriculturist in the Last Year:   Transportation Needs:   . Film/video editor (Medical):   Marland Kitchen Lack of Transportation (Non-Medical):   Physical Activity:   . Days of Exercise per Week:   . Minutes of Exercise per Session:   Stress:   . Feeling of Stress :   Social Connections:   . Frequency of Communication with Friends and Family:   . Frequency of Social Gatherings with Friends and Family:   . Attends Religious Services:   . Active Member of Clubs or Organizations:   . Attends Archivist Meetings:   Marland Kitchen Marital Status:   Intimate Partner Violence:   . Fear of Current or Ex-Partner:   . Emotionally Abused:   Marland Kitchen Physically Abused:   . Sexually Abused:     Family History  Problem Relation Age of Onset  . Hypertension Mother   . Diabetes Mother   . Hypertension Father   . Diabetes Father   . CVA Brother     Past Surgical History:  Procedure Laterality Date  . TUBAL LIGATION      ROS: Review of Systems Negative except as stated above  PHYSICAL EXAM: BP (!) 135/97   Pulse 93   Temp 98.2 F (36.8 C)   Resp 16   Ht _0  (1.626 m)   Wt 260 lb 3.2 oz (118 kg)   SpO2 92%   BMI 44.66 kg/m   BP 126/92 Physical Exam  General appearance - alert, well appearing, obese middle-age African-American female and in no distress Mental status - normal mood, behavior, speech, dress, motor activity, and thought processes Eyes - pupils equal and reactive, extraocular eye movements intact Neck - supple, no significant adenopathy Chest - clear to auscultation, no wheezes, rales or rhonchi, symmetric air entry Heart - normal rate, regular rhythm, normal S1, S2, no murmurs, rubs, clicks or  gallops Extremities - peripheral pulses normal, no pedal edema, no clubbing or cyanosis Diabetic Foot Exam - Simple   Simple Foot Form Visual Inspection No deformities, no ulcerations, no other skin breakdown bilaterally: Yes Sensation Testing Intact to touch and monofilament testing bilaterally: Yes Pulse Check Posterior Tibialis and  Dorsalis pulse intact bilaterally: Yes Comments      CMP Latest Ref Rng & Units 12/24/2019 01/28/2019 09/12/2018  Glucose 65 - 99 mg/dL 312(H) 250(H) 373(H)  BUN 6 - 24 mg/dL _0 Creatinine 0.57 - 1.00 mg/dL 0.69 0.68 0.65  Sodium 134 - 144 mmol/L 137 139 132(L)  Potassium 3.5 - 5.2 mmol/L 4.8 4.7 4.7  Chloride 96 - 106 mmol/L 98 99 91(L)  CO2 20 - 29 mmol/L _1 Calcium 8.7 - 10.2 mg/dL 10.6(H) 10.3(H) 9.9  Total Protein 6.0 - 8.5 g/dL 7.4 7.5 7.5  Total Bilirubin 0.0 - 1.2 mg/dL 0.3 0.3 0.3  Alkaline Phos 39 - 117 IU/L 33(L) 25(L) 34(L)  AST 0 - 40 IU/L _2 ALT 0 - 32 IU/L _3 Lipid Panel     Component Value Date/Time   CHOL 138 12/24/2019 1515   TRIG 83 12/24/2019 1515   HDL 78 12/24/2019 1515   CHOLHDL 1.8 12/24/2019 1515   LDLCALC 44 12/24/2019 1515    CBC    Component Value Date/Time   WBC 10.6 12/24/2019 1515   WBC 9.0 05/15/2018 0843   RBC 4.98 12/24/2019 1515   RBC 4.62 05/15/2018 0843   HGB 14.7 12/24/2019 1515   HCT 45.9 12/24/2019 1515   PLT 404 12/24/2019 1515   MCV 92 12/24/2019 1515   MCH 29.5 12/24/2019 1515   MCH 29.9 05/15/2018 0843   MCHC 32.0 12/24/2019 1515   MCHC 33.5 05/15/2018 0843   RDW 13.9 12/24/2019 1515   LYMPHSABS 3.3 (H) 12/24/2019 1515   EOSABS 0.2 12/24/2019 1515   BASOSABS 0.1 12/24/2019 1515    ASSESSMENT AND PLAN: 1. Type 2 diabetes mellitus with hyperglycemia, without long-term current use of insulin (HCC) Discussed the importance of healthy eating habits, regular aerobic exercise (at least 150 minutes a week as tolerated) and medication compliance to achieve or  maintain control of diabetes. -She will continue Metformin and glimepiride.  Discussed starting Trulicity to help get her at goal.  It also has the added benefit of possible weight loss.  I went over how Trulicity works and some of the possible side effects including nausea/vomiting and abdominal pain.  Patient to stop the medication if she develops any vomiting or epigastric pain.  She is agreeable to trying the medication.  Clinical pharmacist met with her today to teach her injection techniques. Advised to check blood sugars at least once to twice a day before meals with goal being 90-130. - POCT glucose (manual entry) - POCT glycosylated hemoglobin (Hb A1C) - Microalbumin / creatinine urine ratio - Dulaglutide (TRULICITY) 6.37 CH/8.8FO SOPN; Inject 0.5 mLs (0.75 mg total) into the skin once a week.  Dispense: 15 mL; Refill: 5 - metFORMIN (GLUCOPHAGE-XR) 500 MG 24 hr tablet; 1 tab PO Q a.m and 2 tabs Q p.m  Dispense: 90 tablet; Refill: 6 - glimepiride (AMARYL) 4 MG tablet; Take 1 tablet (4 mg total) by mouth daily before breakfast.  Dispense: 30 tablet; Refill: 6  2. Essential hypertension Not at goal.  Add Cozaar.  Continue low-salt diet. - losartan (COZAAR) 25 MG tablet; Take 1 tablet (25 mg total) by mouth daily.  Dispense: 30 tablet; Refill: 6 - atenolol (TENORMIN) 25 MG tablet; Take 1 tablet (25 mg total) by mouth daily. Must have office visit for refills  Dispense: 30 tablet; Refill: 6  3. Class 3 severe obesity due to excess calories with serious comorbidity and body  mass index (BMI) of 40.0 to 44.9 in adult [facility]) See #1 above.  Dietary counseling given.  Have also printed some information for her.  4. Spondylosis without myelopathy or radiculopathy, cervical region [place] controlled substance prescribing website reviewed and is appropriate.  I have given a limited refill of 15 pills to use as needed.  Patient reports good relief with the medication when she has a flareup of her  neck pain.  She has seen the orthopedics Dr. Lorin Mercy in the past for this.  Warned that the medication can cause some drowsiness. - traMADol (ULTRAM) 50 MG tablet; Take 1 tablet (50 mg total) by mouth every 12 (twelve) hours as needed.  Dispense: 15 tablet; Refill: 0  5. Need for vaccination against Streptococcus pneumoniae Given today.  6. Need for Tdap vaccination Given today.     Patient was given the opportunity to ask questions.  Patient verbalized understanding of the plan and was able to repeat key elements of the plan.   Orders Placed This Encounter  Procedures  . Microalbumin / creatinine urine ratio  . POCT glucose (manual entry)  . POCT glycosylated hemoglobin (Hb A1C)     Requested Prescriptions   Signed Prescriptions Disp Refills  . Dulaglutide (TRULICITY) 5.99 OQ/9.5LI SOPN 15 mL 5    Sig: Inject 0.5 mLs (0.75 mg total) into the skin once a week.  . losartan (COZAAR) 25 MG tablet 30 tablet 6    Sig: Take 1 tablet (25 mg total) by mouth daily.  . metFORMIN (GLUCOPHAGE-XR) 500 MG 24 hr tablet 90 tablet 6    Sig: 1 tab PO Q a.m and 2 tabs Q p.m  . atenolol (TENORMIN) 25 MG tablet 30 tablet 6    Sig: Take 1 tablet (25 mg total) by mouth daily. Must have office visit for refills  . glimepiride (AMARYL) 4 MG tablet 30 tablet 6    Sig: Take 1 tablet (4 mg total) by mouth daily before breakfast.  . traMADol (ULTRAM) 50 MG tablet 15 tablet 0    Sig: Take 1 tablet (50 mg total) by mouth every 12 (twelve) hours as needed.    Return in about 4 weeks (around 08/27/2020) for pap with Dr. Wynetta Emery.Karle Plumber, MD, FACP

## 2020-07-30 NOTE — Patient Instructions (Addendum)
Your blood pressure is not at goal.  We have added a new medication call losartan 25 mg daily.  Continue the atenolol.  Follow-up in 4 weeks for Pap.  We will recheck your blood pressure at that time.   Your A1c has improved but our goal is to get you to an A1c less than 7.  Please try to check your blood sugars at least daily in the mornings before breakfast.  Blood sugars before meal should be between 90-130.  Continue Metformin and glimepiride.  We have added Trulicity to take once a week.  Trulicity can cause some nausea.  If you develop any abdominal pain or vomiting with the medication please stop it and let us know.    Diabetes Mellitus and Nutrition, Adult When you have diabetes (diabetes mellitus), it is very important to have healthy eating habits because your blood sugar (glucose) levels are greatly affected by what you eat and drink. Eating healthy foods in the appropriate amounts, at about the same times every day, can help you:  Control your blood glucose.  Lower your risk of heart disease.  Improve your blood pressure.  Reach or maintain a healthy weight. Every person with diabetes is different, and each person has different needs for a meal plan. Your health care provider may recommend that you work with a diet and nutrition specialist (dietitian) to make a meal plan that is best for you. Your meal plan may vary depending on factors such as:  The calories you need.  The medicines you take.  Your weight.  Your blood glucose, blood pressure, and cholesterol levels.  Your activity level.  Other health conditions you have, such as heart or kidney disease. How do carbohydrates affect me? Carbohydrates, also called carbs, affect your blood glucose level more than any other type of food. Eating carbs naturally raises the amount of glucose in your blood. Carb counting is a method for keeping track of how many carbs you eat. Counting carbs is important to keep your blood glucose  at a healthy level, especially if you use insulin or take certain oral diabetes medicines. It is important to know how many carbs you can safely have in each meal. This is different for every person. Your dietitian can help you calculate how many carbs you should have at each meal and for each snack. Foods that contain carbs include:  Bread, cereal, rice, pasta, and crackers.  Potatoes and corn.  Peas, beans, and lentils.  Milk and yogurt.  Fruit and juice.  Desserts, such as cakes, cookies, ice cream, and candy. How does alcohol affect me? Alcohol can cause a sudden decrease in blood glucose (hypoglycemia), especially if you use insulin or take certain oral diabetes medicines. Hypoglycemia can be a life-threatening condition. Symptoms of hypoglycemia (sleepiness, dizziness, and confusion) are similar to symptoms of having too much alcohol. If your health care provider says that alcohol is safe for you, follow these guidelines:  Limit alcohol intake to no more than 1 drink per day for nonpregnant women and 2 drinks per day for men. One drink equals 12 oz of beer, 5 oz of wine, or 1 oz of hard liquor.  Do not drink on an empty stomach.  Keep yourself hydrated with water, diet soda, or unsweetened iced tea.  Keep in mind that regular soda, juice, and other mixers may contain a lot of sugar and must be counted as carbs. What are tips for following this plan?  Reading food labels  Start by checking the serving size on the "Nutrition Facts" label of packaged foods and drinks. The amount of calories, carbs, fats, and other nutrients listed on the label is based on one serving of the item. Many items contain more than one serving per package.  Check the total grams (g) of carbs in one serving. You can calculate the number of servings of carbs in one serving by dividing the total carbs by 15. For example, if a food has 30 g of total carbs, it would be equal to 2 servings of carbs.  Check  the number of grams (g) of saturated and trans fats in one serving. Choose foods that have low or no amount of these fats.  Check the number of milligrams (mg) of salt (sodium) in one serving. Most people should limit total sodium intake to less than 2,300 mg per day.  Always check the nutrition information of foods labeled as "low-fat" or "nonfat". These foods may be higher in added sugar or refined carbs and should be avoided.  Talk to your dietitian to identify your daily goals for nutrients listed on the label. Shopping  Avoid buying canned, premade, or processed foods. These foods tend to be high in fat, sodium, and added sugar.  Shop around the outside edge of the grocery store. This includes fresh fruits and vegetables, bulk grains, fresh meats, and fresh dairy. Cooking  Use low-heat cooking methods, such as baking, instead of high-heat cooking methods like deep frying.  Cook using healthy oils, such as olive, canola, or sunflower oil.  Avoid cooking with butter, cream, or high-fat meats. Meal planning  Eat meals and snacks regularly, preferably at the same times every day. Avoid going long periods of time without eating.  Eat foods high in fiber, such as fresh fruits, vegetables, beans, and whole grains. Talk to your dietitian about how many servings of carbs you can eat at each meal.  Eat 4-6 ounces (oz) of lean protein each day, such as lean meat, chicken, fish, eggs, or tofu. One oz of lean protein is equal to: ? 1 oz of meat, chicken, or fish. ? 1 egg. ?  cup of tofu.  Eat some foods each day that contain healthy fats, such as avocado, nuts, seeds, and fish. Lifestyle  Check your blood glucose regularly.  Exercise regularly as told by your health care provider. This may include: ? 150 minutes of moderate-intensity or vigorous-intensity exercise each week. This could be brisk walking, biking, or water aerobics. ? Stretching and doing strength exercises, such as yoga  or weightlifting, at least 2 times a week.  Take medicines as told by your health care provider.  Do not use any products that contain nicotine or tobacco, such as cigarettes and e-cigarettes. If you need help quitting, ask your health care provider.  Work with a Veterinary surgeon or diabetes educator to identify strategies to manage stress and any emotional and social challenges. Questions to ask a health care provider  Do I need to meet with a diabetes educator?  Do I need to meet with a dietitian?  What number can I call if I have questions?  When are the best times to check my blood glucose? Where to find more information:  American Diabetes Association: diabetes.org  Academy of Nutrition and Dietetics: www.eatright.[employer] of Diabetes and Digestive and Kidney Diseases (NIH): CarFlippers.tn Summary  A healthy meal plan will help you control your blood glucose and maintain a healthy lifestyle.  Working with  a diet and nutrition specialist (dietitian) can help you make a meal plan that is best for you.  Keep in mind that carbohydrates (carbs) and alcohol have immediate effects on your blood glucose levels. It is important to count carbs and to use alcohol carefully. This information is not intended to replace advice given to you by your health care provider. Make sure you discuss any questions you have with your health care provider. Document Revised: 11/16/2017 Document Reviewed: 01/08/2017 Elsevier Patient Education  2020 Elsevier Inc.   Td (Tetanus, Diphtheria) Vaccine: What You Need to Know 1. Why get vaccinated? Td vaccine can prevent tetanus and diphtheria. Tetanus enters the body through cuts or wounds. Diphtheria spreads from person to person.  TETANUS (T) causes painful stiffening of the muscles. Tetanus can lead to serious health problems, including being unable to open the mouth, having trouble swallowing and breathing, or death.  DIPHTHERIA (D) can  lead to difficulty breathing, heart failure, paralysis, or death. 2. Td vaccine Td is only for children 7 years and older, adolescents, and adults.  Td is usually given as a booster dose every 10 years, but it can also be given earlier after a severe and dirty wound or burn. Another vaccine, called Tdap, that protects against pertussis, also known as "whooping cough," in addition to tetanus and diphtheria, may be used instead of Td.  Td may be given at the same time as other vaccines. 3. Talk with your health care provider Tell your vaccine provider if the person getting the vaccine:  Has had an allergic reaction after a previous dose of any vaccine that protects against tetanus or diphtheria, or has any severe, life-threatening allergies.  Has ever had Guillain-Barr Syndrome (also called GBS).  Has had severe pain or swelling after a previous dose of any vaccine that protects against tetanus or diphtheria. In some cases, your health care provider may decide to postpone Td vaccination to a future visit.  People with minor illnesses, such as a cold, may be vaccinated. People who are moderately or severely ill should usually wait until they recover before getting Td vaccine.  Your health care provider can give you more information. 4. Risks of a vaccine reaction  Pain, redness, or swelling where the shot was given, mild fever, headache, feeling tired, and nausea, vomiting, diarrhea, or stomachache sometimes happen after Td vaccine. People sometimes faint after medical procedures, including vaccination. Tell your provider if you feel dizzy or have vision changes or ringing in the ears.  As with any medicine, there is a very remote chance of a vaccine causing a severe allergic reaction, other serious injury, or death. 5. What if there is a serious problem? An allergic reaction could occur after the vaccinated person leaves the clinic. If you see signs of a severe allergic reaction (hives,  swelling of the face and throat, difficulty breathing, a fast heartbeat, dizziness, or weakness), call 9-1-1 and get the person to the nearest hospital.  For other signs that concern you, call your health care provider.  Adverse reactions should be reported to the Vaccine Adverse Event Reporting System (VAERS). Your health care provider will usually file this report, or you can do it yourself. Visit the VAERS website at www.vaers.LAgents.nohhs.gov or call (973)642-70731-510-461-8969. VAERS is only for reporting reactions, and VAERS staff do not give medical advice. 6. The National Vaccine Injury Compensation Program The National Vaccine Injury Compensation Program (VICP) is a federal program that was created to compensate people who may have  been injured by certain vaccines. Visit the VICP website at SpiritualWord.at or call (785) 751-5531 to learn about the program and about filing a claim. There is a time limit to file a claim for compensation. 7. How can I learn more?  Ask your health care provider.  Call your local or state health department.  Contact the Centers for Disease Control and Prevention (CDC): ? Call 716 216 3394 (1-800-CDC-INFO) or ? Visit CDC's website at PicCapture.uy Vaccine Information Statement Td Vaccine (03/19/19) This information is not intended to replace advice given to you by your health care provider. Make sure you discuss any questions you have with your health care provider. Document Revised: 04/28/2019 Document Reviewed: 03/31/2019 Elsevier Patient Education  2020 Elsevier Inc.   Pneumococcal Polysaccharide Vaccine (PPSV23): What You Need to Know 1. Why get vaccinated? Pneumococcal polysaccharide vaccine (PPSV23) can prevent pneumococcal disease. Pneumococcal disease refers to any illness caused by pneumococcal bacteria. These bacteria can cause many types of illnesses, including pneumonia, which is an infection of the lungs. Pneumococcal bacteria are one of  the most common causes of pneumonia. Besides pneumonia, pneumococcal bacteria can also cause:  Ear infections  Sinus infections  Meningitis (infection of the tissue covering the brain and spinal cord)  Bacteremia (bloodstream infection) Anyone can get pneumococcal disease, but children under 23 years of age, people with certain medical conditions, adults 65 years or older, and cigarette smokers are at the highest risk. Most pneumococcal infections are mild. However, some can result in long-term problems, such as brain damage or hearing loss. Meningitis, bacteremia, and pneumonia caused by pneumococcal disease can be fatal. 2. PPSV23 PPSV23 protects against 23 types of bacteria that cause pneumococcal disease. PPSV23 is recommended for:  All adults 65 years or older,  Anyone 2 years or older with certain medical conditions that can lead to an increased risk for pneumococcal disease. Most people need only one dose of PPSV23. A second dose of PPSV23, and another type of pneumococcal vaccine called PCV13, are recommended for certain high-risk groups. Your health care provider can give you more information. People 65 years or older should get a dose of PPSV23 even if they have already gotten one or more doses of the vaccine before they turned 49. 3. Talk with your health care provider Tell your vaccine provider if the person getting the vaccine:  Has had an allergic reaction after a previous dose of PPSV23, or has any severe, life-threatening allergies. In some cases, your health care provider may decide to postpone PPSV23 vaccination to a future visit. People with minor illnesses, such as a cold, may be vaccinated. People who are moderately or severely ill should usually wait until they recover before getting PPSV23. Your health care provider can give you more information. 4. Risks of a vaccine reaction  Redness or pain where the shot is given, feeling tired, fever, or muscle aches can  happen after PPSV23. People sometimes faint after medical procedures, including vaccination. Tell your provider if you feel dizzy or have vision changes or ringing in the ears. As with any medicine, there is a very remote chance of a vaccine causing a severe allergic reaction, other serious injury, or death. 5. What if there is a serious problem? An allergic reaction could occur after the vaccinated person leaves the clinic. If you see signs of a severe allergic reaction (hives, swelling of the face and throat, difficulty breathing, a fast heartbeat, dizziness, or weakness), call 9-1-1 and get the person to the nearest hospital. For other  signs that concern you, call your health care provider. Adverse reactions should be reported to the Vaccine Adverse Event Reporting System (VAERS). Your health care provider will usually file this report, or you can do it yourself. Visit the VAERS website at www.vaers.LAgents.no or call (252) 276-7295. VAERS is only for reporting reactions, and VAERS staff do not give medical advice. 6. How can I learn more?  Ask your health care provider.  Call your local or state health department.  Contact the Centers for Disease Control and Prevention (CDC): ? Call 712-680-4333 (1-800-CDC-INFO) or ? Visit CDC's website at PicCapture.uy CDC Vaccine Information Statement PPSV23 Vaccine (10/16/2018) This information is not intended to replace advice given to you by your health care provider. Make sure you discuss any questions you have with your health care provider. Document Revised: 03/25/2019 Document Reviewed: 07/16/2018 Elsevier Patient Education  2020 [employer].

## 2020-07-30 NOTE — Progress Notes (Signed)
Patient presents for vaccination against strep pneumo and tetanus per orders of Dr. Laural Benes. Consent given. Counseling provided. No contraindications exists. Vaccine administered without incident.   Patient was educated on the use of the Trulicity pen. Reviewed necessary supplies and operation of the pen. Also reviewed goal blood glucose levels. Patient was able to demonstrate use. All questions and concerns were addressed.   Butch Penny, PharmD, CPP Clinical Pharmacist [facility] [phone]

## 2020-07-31 ENCOUNTER — Telehealth: Payer: Self-pay

## 2020-07-31 NOTE — Telephone Encounter (Signed)
PA FOR TRAMADOL WAS DENIED.

## 2020-08-11 ENCOUNTER — Other Ambulatory Visit: Payer: Self-pay | Admitting: Family Medicine

## 2020-08-11 DIAGNOSIS — E119 Type 2 diabetes mellitus without complications: Secondary | ICD-10-CM

## 2020-08-11 MED ORDER — ACCU-CHEK FASTCLIX LANCETS MISC: 12 refills | Status: DC

## 2020-08-11 MED ORDER — ACCU-CHEK AVIVA PLUS VI STRP: 1.0000 | ORAL_STRIP | Freq: Three times a day (TID) | 11 refills | Status: DC

## 2020-08-11 NOTE — Telephone Encounter (Signed)
Medication Refill - Medication: ACCU-CHEK AVIVA PLUS test strip Lancets Misc. (ACCU-CHEK FASTCLIX LANCET) KIT     Preferred Pharmacy (with phone number or street name):  [place], [place] Phone:  [phone]  Fax:  [phone]       Agent: Please be advised that RX refills may take up to 3 business days. We ask that you follow-up with your pharmacy.

## 2020-09-01 ENCOUNTER — Telehealth: Payer: Self-pay | Admitting: Family Medicine

## 2020-09-01 ENCOUNTER — Other Ambulatory Visit: Payer: Self-pay | Admitting: Internal Medicine

## 2020-09-01 DIAGNOSIS — M47812 Spondylosis without myelopathy or radiculopathy, cervical region: Secondary | ICD-10-CM

## 2020-09-01 NOTE — Telephone Encounter (Signed)
Requested medication (s) are due for refill today - unsure  Requested medication (s) are on the active medication list -yes  Future visit scheduled -no  Last refill: 07/30/20  Notes to clinic: Request RF non delegated Rx  Requested Prescriptions  Pending Prescriptions Disp Refills   traMADol (ULTRAM) 50 MG tablet [Pharmacy Med Name: tramadol 50 mg tablet] 15 tablet 0    Sig: Take 1 tablet (50 mg total) by mouth every 12 (twelve) hours as needed.      Not Delegated - Analgesics:  Opioid Agonists Failed - 09/01/2020  1:51 PM      Failed - This refill cannot be delegated      Failed - Urine Drug Screen completed in last 360 days.      Passed - Valid encounter within last 6 months    Recent Outpatient Visits           1 month ago Need for vaccination against Streptococcus pneumoniae   [place] [facility] [place], Jeannett Senior L, RPH-CPP   1 month ago Type 2 diabetes mellitus with hyperglycemia, without long-term current use of insulin [facility])   [place] [facility] Jonah Blue B, MD   8 months ago Type 2 diabetes mellitus without complication, without long-term current use of insulin [facility])   [place] [facility] [place], [place], [place]   1 year ago Uncontrolled type 2 diabetes mellitus with hyperglycemia (HCC)   [facility] [place], [place], MD   1 year ago Uncontrolled type 2 diabetes mellitus with hyperglycemia (HCC)   [place] [place], [place], MD                  Requested Prescriptions  Pending Prescriptions Disp Refills   traMADol (ULTRAM) 50 MG tablet [Pharmacy Med Name: tramadol 50 mg tablet] 15 tablet 0    Sig: Take 1 tablet (50 mg total) by mouth every 12 (twelve) hours as needed.      Not Delegated - Analgesics:  Opioid Agonists Failed - 09/01/2020  1:51 PM      Failed - This refill cannot be delegated      Failed - Urine Drug Screen  completed in last 360 days.      Passed - Valid encounter within last 6 months    Recent Outpatient Visits           1 month ago Need for vaccination against Streptococcus pneumoniae   [place] [facility] [place], Jeannett Senior L, RPH-CPP   1 month ago Type 2 diabetes mellitus with hyperglycemia, without long-term current use of insulin [facility])   [place] [facility] Jonah Blue B, MD   8 months ago Type 2 diabetes mellitus without complication, without long-term current use of insulin [facility])   [place] [facility] [place], [place], [place]   1 year ago Uncontrolled type 2 diabetes mellitus with hyperglycemia (HCC)   [place] [place], [place], MD   1 year ago Uncontrolled type 2 diabetes mellitus with hyperglycemia [facility])   [place] [facility] Cain Saupe, MD

## 2020-09-01 NOTE — Telephone Encounter (Signed)
Medication Refill - Medication: traMADol (ULTRAM) 50 MG tablet   Has the patient contacted their pharmacy? Yes.   (Agent: If no, request that the patient contact the pharmacy for the refill.) (Agent: If yes, when and what did the pharmacy advise?)  Preferred Pharmacy (with phone number or street name):  [facility] - [place], [place] - [address] Phone:  [phone]  Fax:  [phone]      Agent: Please be advised that RX refills may take up to 3 business days. We ask that you follow-up with your pharmacy.

## 2020-09-02 ENCOUNTER — Other Ambulatory Visit: Payer: Self-pay | Admitting: Family Medicine

## 2020-09-02 DIAGNOSIS — M47812 Spondylosis without myelopathy or radiculopathy, cervical region: Secondary | ICD-10-CM

## 2020-09-02 MED ORDER — TRAMADOL HCL 50 MG PO TABS: 50.0000 mg | ORAL_TABLET | Freq: Two times a day (BID) | ORAL | 0 refills | Status: AC | PRN

## 2020-09-02 NOTE — Telephone Encounter (Signed)
5-day supply sent to [facility] pharmacy

## 2020-09-02 NOTE — Progress Notes (Signed)
Patient ID: Elaine Marquez, female   DOB: 11-10-1971, 49 y.o.   MRN: [id number]   Refill request received for tramadol.  Most recent visit note reviewed as well as PDMR.  Prescription for 5-day supply, #15 sent to [facility] pharmacy.

## 2020-09-17 ENCOUNTER — Telehealth: Payer: Self-pay | Admitting: Family Medicine

## 2020-09-17 NOTE — Telephone Encounter (Signed)
Patient requested it be documented in her chart that she attempted to schedule an appt today but there were no openings.

## 2020-09-18 ENCOUNTER — Other Ambulatory Visit: Payer: Self-pay

## 2020-09-18 ENCOUNTER — Emergency Department (HOSPITAL_COMMUNITY): Payer: Medicaid Other

## 2020-09-18 ENCOUNTER — Encounter (HOSPITAL_COMMUNITY): Payer: Self-pay | Admitting: Emergency Medicine

## 2020-09-18 ENCOUNTER — Emergency Department (HOSPITAL_COMMUNITY)
Admission: EM | Admit: 2020-09-18 | Discharge: 2020-09-18 | Disposition: A | Discharge status: Home / Self Care | Payer: Medicaid Other | Attending: Emergency Medicine | Admitting: Emergency Medicine

## 2020-09-18 DIAGNOSIS — M549 Dorsalgia, unspecified: Secondary | ICD-10-CM | POA: Diagnosis present

## 2020-09-18 DIAGNOSIS — Z794 Long term (current) use of insulin: Secondary | ICD-10-CM | POA: Diagnosis not present

## 2020-09-18 DIAGNOSIS — E119 Type 2 diabetes mellitus without complications: Secondary | ICD-10-CM | POA: Insufficient documentation

## 2020-09-18 DIAGNOSIS — M545 Low back pain, unspecified: Secondary | ICD-10-CM | POA: Diagnosis not present

## 2020-09-18 DIAGNOSIS — I10 Essential (primary) hypertension: Secondary | ICD-10-CM | POA: Insufficient documentation

## 2020-09-18 DIAGNOSIS — W19XXXA Unspecified fall, initial encounter: Secondary | ICD-10-CM

## 2020-09-18 DIAGNOSIS — J45909 Unspecified asthma, uncomplicated: Secondary | ICD-10-CM | POA: Insufficient documentation

## 2020-09-18 DIAGNOSIS — Z7984 Long term (current) use of oral hypoglycemic drugs: Secondary | ICD-10-CM | POA: Diagnosis not present

## 2020-09-18 DIAGNOSIS — M7918 Myalgia, other site: Secondary | ICD-10-CM | POA: Diagnosis not present

## 2020-09-18 DIAGNOSIS — Z79899 Other long term (current) drug therapy: Secondary | ICD-10-CM | POA: Insufficient documentation

## 2020-09-18 DIAGNOSIS — Z7951 Long term (current) use of inhaled steroids: Secondary | ICD-10-CM | POA: Insufficient documentation

## 2020-09-18 DIAGNOSIS — W010XXA Fall on same level from slipping, tripping and stumbling without subsequent striking against object, initial encounter: Secondary | ICD-10-CM | POA: Diagnosis not present

## 2020-09-18 DIAGNOSIS — R0781 Pleurodynia: Secondary | ICD-10-CM | POA: Diagnosis not present

## 2020-09-18 DIAGNOSIS — M542 Cervicalgia: Secondary | ICD-10-CM | POA: Diagnosis not present

## 2020-09-18 MED ORDER — KETOROLAC TROMETHAMINE 30 MG/ML IJ SOLN
30.0000 mg | Freq: Once | INTRAMUSCULAR | Status: AC
  Administered 2020-09-18: 30 mg via INTRAMUSCULAR
  Filled 2020-09-18: qty 1

## 2020-09-18 MED ORDER — METHOCARBAMOL 750 MG PO TABS: 750.0000 mg | ORAL_TABLET | Freq: Every evening | ORAL | 0 refills | Status: AC | PRN

## 2020-09-18 MED ORDER — OXYCODONE-ACETAMINOPHEN 5-325 MG PO TABS
1.0000 | ORAL_TABLET | Freq: Once | ORAL | Status: AC
  Administered 2020-09-18: 1 via ORAL
  Filled 2020-09-18: qty 1

## 2020-09-18 MED ORDER — DICLOFENAC SODIUM 1 % EX GEL: 2.0000 g | Freq: Four times a day (QID) | CUTANEOUS | 0 refills | Status: DC

## 2020-09-18 NOTE — ED Triage Notes (Signed)
Pt. Stated, I fell last Thursday and hurt my back. My whole spine hurts

## 2020-09-18 NOTE — Discharge Instructions (Signed)
You may alternate taking Tylenol as needed for pain control. You may take 500-1000 mg of Tylenol every 6 hours. Do not exceed 4000 mg of Tylenol daily as this can lead to liver damage. You may use warm and cold compresses to help with your symptoms.   You were given a prescription for Robaxin which is a muscle relaxer.  You should not drive, work, or operate machinery while taking this medication as it can make you very drowsy.    Please follow up with your primary care provider within 5-7 days for re-evaluation of your symptoms. If you do not have a primary care provider, information for a healthcare clinic has been provided for you to make arrangements for follow up care. Please return to the emergency department for any new or worsening symptoms.  

## 2020-09-18 NOTE — ED Provider Notes (Addendum)
[facility] DEPARTMENT Provider Note   CSN: [id number] Arrival date & time: 09/18/20  1042     History Chief Complaint  Patient presents with  . Fall  . Back Pain    Elaine Marquez is a 49 y.o. female.  HPI   49 year old female with a history of asthma, diabetes, hypertension, peritonsillar abscess, who presents to the emergency department today for evaluation of back pain.  Patient states that she was at [employer], slipped and fell 2 days ago.  She landed on her buttocks.  She denies any head trauma or LOC.  She has been complaining of pain to her lower back and neck.  She also has some pain to the right mid back along her ribs.  She denies any problems breathing or pain with inspiration.  She tried taking tramadol at home without resolution of symptoms.  She denies any associated numbness/weakness or loss control of bowel or bladder function.  She has been ambulatory.  Past Medical History:  Diagnosis Date  . Asthma   . Diabetes mellitus without complication ([place])   . Hypertension   . Peritonsillar abscess     Patient Active Problem List   Diagnosis Date Noted  . Spondylosis without myelopathy or radiculopathy, cervical region 02/12/2019  . Type 2 diabetes mellitus without complication, without long-term current use of insulin ([place]) 05/15/2018    Past Surgical History:  Procedure Laterality Date  . TUBAL LIGATION       OB History   No obstetric history on file.     Family History  Problem Relation Age of Onset  . Hypertension Mother   . Diabetes Mother   . Hypertension Father   . Diabetes Father   . CVA Brother     Social History   Tobacco Use  . Smoking status: Never Smoker  . Smokeless tobacco: Never Used  Substance Use Topics  . Alcohol use: Yes    Comment: occasionally   . Drug use: No    Home Medications Prior to Admission medications   Medication Sig Start Date End Date Taking? Authorizing Provider  Accu-Chek  FastClix Lancets MISC TEST THREE TIMES DAILY AS DIRECTED 08/11/20   Ladell Pier, MD  albuterol (PROVENTIL) (2.5 MG/3ML) 0.083% nebulizer solution Take 3 mLs (2.5 mg total) by nebulization every 6 (six) hours as needed for wheezing. 08/11/13   Antonietta Breach, PA-C  albuterol (VENTOLIN HFA) 108 (90 Base) MCG/ACT inhaler Inhale 2 puffs into the lungs every 6 (six) hours as needed for wheezing or shortness of breath. 07/30/20   Argentina Donovan, PA-C  atenolol (TENORMIN) 25 MG tablet Take 1 tablet (25 mg total) by mouth daily. Must have office visit for refills 07/30/20   Ladell Pier, MD  diclofenac Sodium (VOLTAREN) 1 % GEL Apply 2 g topically 4 (four) times daily. 09/18/20   Mikaelyn Arthurs S, PA-C  Dulaglutide (TRULICITY) 5.36 IW/8.0HO SOPN Inject 0.5 mLs (0.75 mg total) into the skin once a week. 07/30/20   Ladell Pier, MD  glimepiride (AMARYL) 4 MG tablet Take 1 tablet (4 mg total) by mouth daily before breakfast. 07/30/20   Ladell Pier, MD  glucose blood (ACCU-CHEK AVIVA PLUS) test strip 1 each by Other route 3 (three) times daily. Use as instructed 08/11/20   Ladell Pier, MD  Lancets Misc. (ACCU-CHEK FASTCLIX LANCET) KIT Check blood glucose at least 3 times daily. 09/12/18   Fulp, Cammie, MD  losartan (COZAAR) 25 MG tablet  Take 1 tablet (25 mg total) by mouth daily. 07/30/20   Ladell Pier, MD  metFORMIN (GLUCOPHAGE-XR) 500 MG 24 hr tablet 1 tab PO Q a.m and 2 tabs Q p.m 07/30/20   Ladell Pier, MD  methocarbamol (ROBAXIN) 750 MG tablet Take 1 tablet (750 mg total) by mouth at bedtime as needed for up to 5 days for muscle spasms. 09/18/20 09/23/20  Cougar Imel S, PA-C    Allergies    Ibuprofen  Review of Systems   Review of Systems  Constitutional: Negative for fever.  Respiratory: Negative for shortness of breath.   Cardiovascular: Negative for chest pain.  Gastrointestinal: Negative for abdominal pain.  Genitourinary: Negative for flank pain.       No  loss of control of bowel or bladder function  Musculoskeletal: Positive for back pain and neck pain. Negative for gait problem.  Skin: Negative for wound.  Neurological: Negative for weakness and numbness.       No head trauma or loc    Physical Exam Updated Vital Signs BP (!) 146/88 (BP Location: Right Arm)   Pulse 93   Temp 98.6 F (37 C) (Oral)   Resp 18   SpO2 99%   Physical Exam Vitals and nursing note reviewed.  Constitutional:      General: She is not in acute distress.    Appearance: She is well-developed.  HENT:     Head: Normocephalic and atraumatic.  Eyes:     Conjunctiva/sclera: Conjunctivae normal.  Cardiovascular:     Rate and Rhythm: Normal rate and regular rhythm.  Pulmonary:     Effort: Pulmonary effort is normal.     Breath sounds: Normal breath sounds.  Chest:     Chest wall: Tenderness (right lateral/posterio chest wall ttp) present.  Musculoskeletal:     Cervical back: Neck supple.     Comments: TTP to the cervical spine and to the cervical paraspinous muscles. TTP to the lumbar spine.   Skin:    General: Skin is warm and dry.  Neurological:     Mental Status: She is alert.     Comments: Motor: Normal tone. 5/5 strength of BUE and BLE major muscle groups including strong and equal grip strength and dorsiflexion/plantar flexion Sensory: light touch normal in all extremities.      ED Results / Procedures / Treatments   Labs (all labs ordered are listed, but only abnormal results are displayed) Labs Reviewed - No data to display  EKG None  Radiology DG Ribs Unilateral W/Chest Right  Result Date: 09/18/2020 CLINICAL DATA:  Pt fell on Thursday and woke up this morning with right rib and lower back pain. EXAM: RIGHT RIBS AND CHEST - 3+ VIEW COMPARISON:  Chest radiograph 08/11/2013 FINDINGS: There is a BB marking the site of pain reported by the patient overlying the lower right ribs. This area is difficult to evaluate radiographically given  overlapping soft tissues. No definite fracture or other bone lesions are seen involving the ribs. There is no evidence of pneumothorax or pleural effusion. Both lungs are clear. Heart size and mediastinal contours are within normal limits. IMPRESSION: No definite acute right rib fracture or other acute cardiopulmonary process. The area of patient's pain is difficult to evaluate radiographically due to overlapping abdominal soft tissue structures. Electronically Signed   By: Audie Pinto M.D.   On: 09/18/2020 14:31   DG Lumbar Spine Complete  Result Date: 09/18/2020 CLINICAL DATA:  Right-sided pain post fall EXAM: LUMBAR SPINE -  COMPLETE 4+ VIEW COMPARISON:  09/02/2012 FINDINGS: Normal alignment. Mild narrowing of the L4-5 interspace. Negative for fracture. Spurring in the lower thoracic spine. IMPRESSION: Negative for fracture or other acute bone abnormality. Electronically Signed   By: Lucrezia Europe M.D.   On: 09/18/2020 14:31   CT Cervical Spine Wo Contrast  Result Date: 09/18/2020 CLINICAL DATA:  Fall last Thursday, pain EXAM: CT CERVICAL SPINE WITHOUT CONTRAST TECHNIQUE: Multidetector CT imaging of the cervical spine was performed without intravenous contrast. Multiplanar CT image reconstructions were also generated. COMPARISON:  None. FINDINGS: Examination of the cervical spine is somewhat limited by motion artifact and body habitus with underpenetration Alignment: Normal. Skull base and vertebrae: No acute fracture. No primary bone lesion or focal pathologic process. Soft tissues and spinal canal: No prevertebral fluid or swelling. No visible canal hematoma. Disc levels: Mild multilevel disc space height loss and osteophytosis. Upper chest: Negative. Other: None. IMPRESSION: 1. Examination of the cervical spine is somewhat limited by motion artifact and body habitus with underpenetration. Within this limitation, no fracture or static subluxation of the cervical spine. 2. Mild multilevel disc space  height loss and osteophytosis. Electronically Signed   By: Eddie Candle M.D.   On: 09/18/2020 14:25    Procedures Procedures (including critical care time)  Medications Ordered in ED Medications  ketorolac (TORADOL) 30 MG/ML injection 30 mg (has no administration in time range)  oxyCODONE-acetaminophen (PERCOCET/ROXICET) 5-325 MG per tablet 1 tablet (has no administration in time range)    ED Course  I have reviewed the triage vital signs and the nursing notes.  Pertinent labs & imaging results that were available during my care of the patient were reviewed by me and considered in my medical decision making (see chart for details).    MDM Rules/Calculators/A&P                          49 year old female with mechanical fall 2 days ago.  Complaining of pain to the lumbar spine and neck.  Has some tenderness to the midline on exam and also with some right lateral/posterior chest wall tenderness.  Neuro intact, no red flag signs/symptoms.  Lungs clear to auscultation bilaterally with normal heart exam.  X-ray right ribs/chest - neg for fx X-ray lumbar spine - neg for acute traumatic injury  CT cervical spine - neg for acute trauamtic injury  Patient given IM pain medications and a percocet while in the emergency department.   Discussed findings and plan for discharge with Robaxin and Voltaren gel.  Advised on close follow-up and return precautions.  She voices understanding of the plan and reasons to return.  All questions answered.  Patient stable for discharge.   Final Clinical Impression(s) / ED Diagnoses Final diagnoses:  Fall, initial encounter  Musculoskeletal pain    Rx / DC Orders ED Discharge Orders         Ordered    methocarbamol (ROBAXIN) 750 MG tablet  At bedtime PRN        09/18/20 1440    diclofenac Sodium (VOLTAREN) 1 % GEL  4 times daily        09/18/20 1441           Dacotah Cabello S, PA-C 09/18/20 1442    Gaylin Osoria S, PA-C 09/18/20 [place], [place], DO 09/18/20 1452

## 2020-09-20 ENCOUNTER — Telehealth: Payer: Self-pay

## 2020-09-20 NOTE — Telephone Encounter (Signed)
Transition Care Management Unsuccessful Follow-up Telephone Call  Date of discharge and from where:  09/18/2020 from [facility]  Attempts:  1st Attempt  Reason for unsuccessful TCM follow-up call:  Left voice message

## 2020-09-21 NOTE — Telephone Encounter (Signed)
Transition Care Management Unsuccessful Follow-up Telephone Call  Date of discharge and from where:  09/18/2020 from [facility]  Attempts:  2nd Attempt  Reason for unsuccessful TCM follow-up call:  Left voice message

## 2020-09-22 NOTE — Telephone Encounter (Signed)
Transition Care Management Unsuccessful Follow-up Telephone Call  Date of discharge and from where:  09/18/2020  Attempts:  3rd Attempt  Reason for unsuccessful TCM follow-up call:  Unable to reach patient

## 2020-10-08 ENCOUNTER — Inpatient Hospital Stay: Payer: Medicaid Other | Admitting: Family Medicine

## 2020-10-11 ENCOUNTER — Encounter: Payer: Self-pay | Admitting: Internal Medicine

## 2020-10-11 ENCOUNTER — Ambulatory Visit: Payer: Medicaid Other | Attending: Internal Medicine | Admitting: Internal Medicine

## 2020-10-11 ENCOUNTER — Other Ambulatory Visit: Payer: Self-pay

## 2020-10-11 VITALS — BP 118/81 | HR 88 | Resp 16 | Wt 260.6 lb

## 2020-10-11 DIAGNOSIS — M542 Cervicalgia: Secondary | ICD-10-CM

## 2020-10-11 DIAGNOSIS — M545 Low back pain, unspecified: Secondary | ICD-10-CM | POA: Diagnosis not present

## 2020-10-11 MED ORDER — METHOCARBAMOL 500 MG PO TABS: 500.0000 mg | ORAL_TABLET | Freq: Two times a day (BID) | ORAL | 0 refills | Status: DC | PRN

## 2020-10-11 MED ORDER — TRAMADOL HCL 50 MG PO TABS: 50.0000 mg | ORAL_TABLET | Freq: Three times a day (TID) | ORAL | 0 refills | Status: DC | PRN

## 2020-10-11 NOTE — Progress Notes (Signed)
Pt states she is also having neck pain   

## 2020-10-11 NOTE — Progress Notes (Signed)
Patient ID: Elaine Marquez, female    DOB: 20-Jun-1971  MRN: [id number]  CC: Hospitalization Follow-up (ED)   Subjective: Elaine Marquez is a 49 y.o. female who presents for ER.  PCP is Dr. Chapman Fitch Her concerns today include:  Patient with history of HTN, DM, mild intermittent arthritis, spondylosis without myelopathy or radiculopathy of the cervical spine, noncompliance with medications  Patient presents today as a follow-up from the emergency room where she was seen 09/18/2020 for lower back and neck pain secondary to fall 2 days prior.  She also complained of right mid back and rib pain.  Patient reported that she slipped and fell on a wet floor in [employer].  ER she was found to have tenderness on the right lateral posterior chest wall, tenderness on palpation to the cervical spine, cervical paraspinal muscles and the lumbar spine.  X-ray of the chest revealed no rib fracture, of the lumbar spine was negative for fracture.  CT of the cervical spine was somewhat limited but revealed no fracture or subluxation.  Incidental finding was mild multilevel disc space height loss and osteophytosis.  Patient was discharged home with Voltaren gel and methocarbamol.  Today: Patient reports that the lower back pain is better.  The pain is across the lower back more so on the right side.  There is no radiation down the legs.  No numbness or tingling.  -Lower back, the neck is not getting better.  She describes the pain in the posterior neck as sharp and worse with movement of the neck especially flexion and extension.  It hurts to do simple routine things around her house like cooking.  The posterior neck is sore to touch.  Robaxin helps but she has been out of it.  Taking Tylenol two Q5-6 hrs but does not help.  Motrin upsets her stomach.  She usually takes tramadol intermittently for her history of chronic intermittent neck pain.  It gives good relief.  However with this acute aggravation, the tramadol  does not seem to help as much.  She is currently not working.  Patient Active Problem List   Diagnosis Date Noted  . Spondylosis without myelopathy or radiculopathy, cervical region 02/12/2019  . Type 2 diabetes mellitus without complication, without long-term current use of insulin ([place]) 05/15/2018     Current Outpatient Medications on File Prior to Visit  Medication Sig Dispense Refill  . Accu-Chek FastClix Lancets MISC TEST THREE TIMES DAILY AS DIRECTED 102 each 12  . albuterol (PROVENTIL) (2.5 MG/3ML) 0.083% nebulizer solution Take 3 mLs (2.5 mg total) by nebulization every 6 (six) hours as needed for wheezing. 75 mL 0  . albuterol (VENTOLIN HFA) 108 (90 Base) MCG/ACT inhaler Inhale 2 puffs into the lungs every 6 (six) hours as needed for wheezing or shortness of breath. 8.5 g 1  . atenolol (TENORMIN) 25 MG tablet Take 1 tablet (25 mg total) by mouth daily. Must have office visit for refills 30 tablet 6  . diclofenac Sodium (VOLTAREN) 1 % GEL Apply 2 g topically 4 (four) times daily. 100 g 0  . Dulaglutide (TRULICITY) 1.74 YC/1.4GY SOPN Inject 0.5 mLs (0.75 mg total) into the skin once a week. 15 mL 5  . glimepiride (AMARYL) 4 MG tablet Take 1 tablet (4 mg total) by mouth daily before breakfast. 30 tablet 6  . glucose blood (ACCU-CHEK AVIVA PLUS) test strip 1 each by Other route 3 (three) times daily. Use as instructed 100 strip 11  . Lancets  Misc. (ACCU-CHEK FASTCLIX LANCET) KIT Check blood glucose at least 3 times daily. 1 kit 6  . losartan (COZAAR) 25 MG tablet Take 1 tablet (25 mg total) by mouth daily. 30 tablet 6  . metFORMIN (GLUCOPHAGE-XR) 500 MG 24 hr tablet 1 tab PO Q a.m and 2 tabs Q p.m 90 tablet 6   No current facility-administered medications on file prior to visit.    Allergies  Allergen Reactions  . Ibuprofen Nausea And Vomiting    Social History   Socioeconomic History  . Marital status: Married    Spouse name: Not on file  . Number of children: Not on file  .  Years of education: Not on file  . Highest education level: Not on file  Occupational History  . Not on file  Tobacco Use  . Smoking status: Never Smoker  . Smokeless tobacco: Never Used  Substance and Sexual Activity  . Alcohol use: Yes    Comment: occasionally   . Drug use: No  . Sexual activity: Not on file  Other Topics Concern  . Not on file  Social History Narrative  . Not on file   Social Determinants of Health   Financial Resource Strain:   . Difficulty of Paying Living Expenses: Not on file  Food Insecurity:   . Worried About Charity fundraiser in the Last Year: Not on file  . Ran Out of Food in the Last Year: Not on file  Transportation Needs:   . Lack of Transportation (Medical): Not on file  . Lack of Transportation (Non-Medical): Not on file  Physical Activity:   . Days of Exercise per Week: Not on file  . Minutes of Exercise per Session: Not on file  Stress:   . Feeling of Stress : Not on file  Social Connections:   . Frequency of Communication with Friends and Family: Not on file  . Frequency of Social Gatherings with Friends and Family: Not on file  . Attends Religious Services: Not on file  . Active Member of Clubs or Organizations: Not on file  . Attends Archivist Meetings: Not on file  . Marital Status: Not on file  Intimate Partner Violence:   . Fear of Current or Ex-Partner: Not on file  . Emotionally Abused: Not on file  . Physically Abused: Not on file  . Sexually Abused: Not on file    Family History  Problem Relation Age of Onset  . Hypertension Mother   . Diabetes Mother   . Hypertension Father   . Diabetes Father   . CVA Brother     Past Surgical History:  Procedure Laterality Date  . TUBAL LIGATION      ROS: Review of Systems Negative except as stated above  PHYSICAL EXAM: BP 118/81   Pulse 88   Resp 16   Wt 260 lb 9.6 oz (118.2 kg)   SpO2 96%   BMI 44.73 kg/m   Physical Exam  General appearance - alert,  well appearing, middle-aged African-American female and in no distress Mental status - normal mood, behavior, speech, dress, motor activity, and thought processes Neurological -5/5 bilaterally.  Power proximally and distally in the upper extremities 5/5 bilaterally.  Gross sensation intact in the upper extremities.  Power in the lower extremities 5/5 bilaterally proximally and distally.  Gross sensation intact.  Gait is normal.   Musculoskeletal -no tenderness on palpation of the lumbar spine and surrounding paraspinal muscles.  Straight leg raise negative.  She  has mild tenderness on palpation of the cervical vertebrae.  She has slowed passive range of motion of the neck in all directions.   CMP Latest Ref Rng & Units 12/24/2019 01/28/2019 09/12/2018  Glucose 65 - 99 mg/dL 312(H) 250(H) 373(H)  BUN 6 - 24 mg/dL _0 Creatinine 0.57 - 1.00 mg/dL 0.69 0.68 0.65  Sodium 134 - 144 mmol/L 137 139 132(L)  Potassium 3.5 - 5.2 mmol/L 4.8 4.7 4.7  Chloride 96 - 106 mmol/L 98 99 91(L)  CO2 20 - 29 mmol/L _1 Calcium 8.7 - 10.2 mg/dL 10.6(H) 10.3(H) 9.9  Total Protein 6.0 - 8.5 g/dL 7.4 7.5 7.5  Total Bilirubin 0.0 - 1.2 mg/dL 0.3 0.3 0.3  Alkaline Phos 39 - 117 IU/L 33(L) 25(L) 34(L)  AST 0 - 40 IU/L _2 ALT 0 - 32 IU/L _3 Lipid Panel     Component Value Date/Time   CHOL 138 12/24/2019 1515   TRIG 83 12/24/2019 1515   HDL 78 12/24/2019 1515   CHOLHDL 1.8 12/24/2019 1515   LDLCALC 44 12/24/2019 1515    CBC    Component Value Date/Time   WBC 10.6 12/24/2019 1515   WBC 9.0 05/15/2018 0843   RBC 4.98 12/24/2019 1515   RBC 4.62 05/15/2018 0843   HGB 14.7 12/24/2019 1515   HCT 45.9 12/24/2019 1515   PLT 404 12/24/2019 1515   MCV 92 12/24/2019 1515   MCH 29.5 12/24/2019 1515   MCH 29.9 05/15/2018 0843   MCHC 32.0 12/24/2019 1515   MCHC 33.5 05/15/2018 0843   RDW 13.9 12/24/2019 1515   LYMPHSABS 3.3 (H) 12/24/2019 1515   EOSABS 0.2 12/24/2019 1515   BASOSABS 0.1  12/24/2019 1515    ASSESSMENT AND PLAN: 1. Acute neck pain Patient has acute aggravation of chronic intermittent neck pain.  Likely severe strain.  Symptoms not suggestive of radiculopathy. I recommend using a heating pad to the neck as needed. I will refill Robaxin and tramadol.  Advised that both medications can cause drowsiness and to take as needed. Referral given to physical therapy. Advised to avoid any lifting, excessive pushing or pulling for the next 2 to 4 weeks. - methocarbamol (ROBAXIN) 500 MG tablet; Take 1 tablet (500 mg total) by mouth 2 (two) times daily as needed for muscle spasms.  Dispense: 40 tablet; Refill: 0 - traMADol (ULTRAM) 50 MG tablet; Take 1 tablet (50 mg total) by mouth every 8 (eight) hours as needed.  Dispense: 50 tablet; Refill: 0 - Ambulatory referral to Physical Therapy  2. Acute bilateral low back pain without sciatica Likely strain.  Patient reports this is much improved. Restrictions as mentioned above. - methocarbamol (ROBAXIN) 500 MG tablet; Take 1 tablet (500 mg total) by mouth 2 (two) times daily as needed for muscle spasms.  Dispense: 40 tablet; Refill: 0 - traMADol (ULTRAM) 50 MG tablet; Take 1 tablet (50 mg total) by mouth every 8 (eight) hours as needed.  Dispense: 50 tablet; Refill: 0    Patient was given the opportunity to ask questions.  Patient verbalized understanding of the plan and was able to repeat key elements of the plan.   Orders Placed This Encounter  Procedures  . Ambulatory referral to Physical Therapy     Requested Prescriptions   Signed Prescriptions Disp Refills  . methocarbamol (ROBAXIN) 500 MG tablet 40 tablet 0    Sig: Take 1 tablet (500 mg total) by mouth 2 (two)  times daily as needed for muscle spasms.  . traMADol (ULTRAM) 50 MG tablet 50 tablet 0    Sig: Take 1 tablet (50 mg total) by mouth every 8 (eight) hours as needed.    Return in about 1 month (around 11/11/2020) for with Dr. Chapman Fitch for chronic disease  management.  Karle Plumber, MD, FACP

## 2020-10-11 NOTE — Patient Instructions (Signed)
I have given refill on tramadol and Robaxin to use as needed.  Both can cause drowsiness.  Do not take when you have to drive or operate any machinery. If you have a heating pad I would use the heating pad to your neck once to twice a day for 10 minutes.  I have referred you for some physical therapy.  You should avoid any lifting, excessive pushing or pulling over the next 2 to 3 weeks.

## 2020-10-26 ENCOUNTER — Ambulatory Visit: Payer: Medicaid Other | Admitting: Physical Therapy

## 2020-11-15 ENCOUNTER — Ambulatory Visit: Payer: Medicaid Other | Attending: Internal Medicine | Admitting: Physical Therapy

## 2020-11-25 ENCOUNTER — Telehealth: Payer: Self-pay | Admitting: Family Medicine

## 2020-11-25 ENCOUNTER — Other Ambulatory Visit: Payer: Self-pay | Admitting: Nurse Practitioner

## 2020-11-25 DIAGNOSIS — M545 Low back pain, unspecified: Secondary | ICD-10-CM

## 2020-11-25 DIAGNOSIS — M542 Cervicalgia: Secondary | ICD-10-CM

## 2020-11-25 MED ORDER — TRAMADOL HCL 50 MG PO TABS: 50.0000 mg | ORAL_TABLET | Freq: Three times a day (TID) | ORAL | 0 refills | Status: AC | PRN

## 2020-11-25 NOTE — Telephone Encounter (Signed)
Pt is requesting a refill on tramadol. Dr. Laural Benes you seen pt on 10/11/20 for Acute neck pain

## 2020-11-25 NOTE — Telephone Encounter (Signed)
Medication Refill - Medication: tramadol   Has the patient contacted their pharmacy? No. (Agent: If no, request that the patient contact the pharmacy for the refill.) (Agent: If yes, when and what did the pharmacy advise?)  Preferred Pharmacy (with phone number or street name): [facility] [address] [place]  Agent: Please be advised that RX refills may take up to 3 business days. lerWe ask that you follow-up with your pharmacy.

## 2020-11-26 NOTE — Telephone Encounter (Signed)
Contacted pt to go over Dr. Johnson message pt is aware and doesn't have any questions or concerns  

## 2021-01-10 ENCOUNTER — Other Ambulatory Visit: Payer: Self-pay | Admitting: Internal Medicine

## 2021-01-10 DIAGNOSIS — E1165 Type 2 diabetes mellitus with hyperglycemia: Secondary | ICD-10-CM

## 2021-01-10 DIAGNOSIS — I10 Essential (primary) hypertension: Secondary | ICD-10-CM

## 2021-01-13 ENCOUNTER — Other Ambulatory Visit: Payer: Self-pay | Admitting: Internal Medicine

## 2021-01-13 DIAGNOSIS — I10 Essential (primary) hypertension: Secondary | ICD-10-CM

## 2021-03-08 ENCOUNTER — Other Ambulatory Visit: Payer: Self-pay | Admitting: Internal Medicine

## 2021-03-08 DIAGNOSIS — E1165 Type 2 diabetes mellitus with hyperglycemia: Secondary | ICD-10-CM

## 2021-04-05 ENCOUNTER — Other Ambulatory Visit: Payer: Self-pay | Admitting: Internal Medicine

## 2021-04-05 DIAGNOSIS — I10 Essential (primary) hypertension: Secondary | ICD-10-CM

## 2021-04-18 ENCOUNTER — Other Ambulatory Visit: Payer: Self-pay | Admitting: Physician Assistant

## 2021-04-18 DIAGNOSIS — J4520 Mild intermittent asthma, uncomplicated: Secondary | ICD-10-CM

## 2021-04-18 DIAGNOSIS — E1165 Type 2 diabetes mellitus with hyperglycemia: Secondary | ICD-10-CM

## 2021-04-18 NOTE — Telephone Encounter (Signed)
Last ordered on 07/30/20 with one refill. Approved per protocol. Requested Prescriptions  Pending Prescriptions Disp Refills  . albuterol (PROAIR HFA) 108 (90 Base) MCG/ACT inhaler 8.5 g 1    Sig: Inhale 2 puffs into the lungs every 6 (six) hours as needed for wheezing or shortness of breath.     Pulmonology:  Beta Agonists Failed - 04/18/2021  9:57 AM      Failed - One inhaler should last at least one month. If the patient is requesting refills earlier, contact the patient to check for uncontrolled symptoms.      Passed - Valid encounter within last 12 months    Recent Outpatient Visits          6 months ago Acute neck pain   [facility] Jonah Blue B, MD   8 months ago Need for vaccination against Streptococcus pneumoniae   [facility] [place], Jeannett Senior L, RPH-CPP   8 months ago Type 2 diabetes mellitus with hyperglycemia, without long-term current use of insulin [facility])   [facility] Jonah Blue B, MD   1 year ago Type 2 diabetes mellitus without complication, without long-term current use of insulin [facility])   [facility] [place], [place], [place]   2 years ago Uncontrolled type 2 diabetes mellitus with hyperglycemia [facility])    [facility] Cain Saupe, MD

## 2021-06-27 ENCOUNTER — Other Ambulatory Visit: Payer: Self-pay | Admitting: Internal Medicine

## 2021-06-27 DIAGNOSIS — I10 Essential (primary) hypertension: Secondary | ICD-10-CM

## 2021-07-18 ENCOUNTER — Other Ambulatory Visit: Payer: Self-pay | Admitting: Internal Medicine

## 2021-07-18 DIAGNOSIS — E119 Type 2 diabetes mellitus without complications: Secondary | ICD-10-CM

## 2021-07-18 NOTE — Telephone Encounter (Signed)
Requested medication (s) are due for refill today: Yes  Requested medication (s) are on the active medication list: Yes  Last refill:  08/11/20  Future visit scheduled:Yes  Notes to clinic:  Unable to refill per protocol, last refill by another provider.      Requested Prescriptions  Pending Prescriptions Disp Refills   ACCU-CHEK AVIVA PLUS test strip [Pharmacy Med Name: Accu-Chek Aviva Plus test strips] 100 strip 11    Sig: 1 each by Other route 3 (three) times daily. Use as instructed      Endocrinology: Diabetes - Testing Supplies Passed - 07/18/2021  9:48 AM      Passed - Valid encounter within last 12 months    Recent Outpatient Visits           9 months ago Acute neck pain   [facility] Jonah Blue B, MD   11 months ago Need for vaccination against Streptococcus pneumoniae   [facility] [place], Jeannett Senior L, RPH-CPP   11 months ago Type 2 diabetes mellitus with hyperglycemia, without long-term current use of insulin [facility])   [facility] Jonah Blue B, MD   1 year ago Type 2 diabetes mellitus without complication, without long-term current use of insulin [facility])   [place] [facility] [place], [place], [place]   2 years ago Uncontrolled type 2 diabetes mellitus with hyperglycemia [facility])   [facility] Cain Saupe, MD

## 2021-07-20 ENCOUNTER — Other Ambulatory Visit: Payer: Self-pay | Admitting: Internal Medicine

## 2021-07-20 DIAGNOSIS — E119 Type 2 diabetes mellitus without complications: Secondary | ICD-10-CM

## 2021-07-21 NOTE — Telephone Encounter (Signed)
Courtesy refill given, appointment needed. 

## 2021-07-21 NOTE — Telephone Encounter (Signed)
Patient called, left VM to return the call to the office to schedule an OV for follow up. Last CPE 07/30/20.

## 2021-07-25 ENCOUNTER — Other Ambulatory Visit: Payer: Self-pay | Admitting: Internal Medicine

## 2021-07-25 DIAGNOSIS — I10 Essential (primary) hypertension: Secondary | ICD-10-CM

## 2021-07-25 DIAGNOSIS — E1165 Type 2 diabetes mellitus with hyperglycemia: Secondary | ICD-10-CM

## 2021-07-27 ENCOUNTER — Other Ambulatory Visit: Payer: Self-pay | Admitting: Internal Medicine

## 2021-07-27 DIAGNOSIS — M542 Cervicalgia: Secondary | ICD-10-CM

## 2021-07-27 DIAGNOSIS — M545 Low back pain, unspecified: Secondary | ICD-10-CM

## 2021-07-27 NOTE — Telephone Encounter (Signed)
Requested medication (s) are due for refill today: expired medication  Requested medication (s) are on the active medication list: no  Last refill:  12/25/20  Future visit scheduled: yes in 1 month   Notes to clinic:  not delegated per protocol. Expired medication , not on med list. Do you want to renew Rx?     Requested Prescriptions  Pending Prescriptions Disp Refills   traMADol (ULTRAM) 50 MG tablet [Pharmacy Med Name: tramadol 50 mg tablet] 50 tablet 0    Sig: Take 1 tablet (50 mg total) by mouth every 8 (eight) hours as needed.      Not Delegated - Analgesics:  Opioid Agonists Failed - 07/27/2021 12:39 PM      Failed - This refill cannot be delegated      Failed - Urine Drug Screen completed in last 360 days      Failed - Valid encounter within last 6 months    Recent Outpatient Visits           9 months ago Acute neck pain   [employer] [facility] [facility] Jonah Blue B, MD   12 months ago Need for vaccination against Streptococcus pneumoniae   [facility] [place], Jeannett Senior L, RPH-CPP   12 months ago Type 2 diabetes mellitus with hyperglycemia, without long-term current use of insulin [facility])   [place] [facility] Jonah Blue B, MD   1 year ago Type 2 diabetes mellitus without complication, without long-term current use of insulin [facility])   [facility] [place], [place], [place]   2 years ago Uncontrolled type 2 diabetes mellitus with hyperglycemia [facility])   [place] Cain Saupe, MD       Future Appointments             In 1 month Laural Benes, Binnie Rail, MD [facility]

## 2021-08-16 ENCOUNTER — Other Ambulatory Visit: Payer: Self-pay | Admitting: Internal Medicine

## 2021-08-16 DIAGNOSIS — E119 Type 2 diabetes mellitus without complications: Secondary | ICD-10-CM

## 2021-08-16 NOTE — Telephone Encounter (Signed)
Requested medication (s) are due for refill today: Yes  Requested medication (s) are on the active medication list: Yes  Last refill:  08/11/20  Future visit scheduled: Yes  Notes to clinic:  Unable to refill per protocol, Rx expired.      Requested Prescriptions  Pending Prescriptions Disp Refills   Accu-Chek Softclix Lancets lancets [Pharmacy Med Name: Accu-Chek Softclix Lancets] 102 each 12    Sig: TEST THREE TIMES DAILY AS DIRECTED     Endocrinology: Diabetes - Testing Supplies Passed - 08/16/2021 12:56 PM      Passed - Valid encounter within last 12 months    Recent Outpatient Visits           10 months ago Acute neck pain   [facility] Marcine Matar, MD   1 year ago Need for vaccination against Streptococcus pneumoniae   [facility] [place], Cornelius Moras, RPH-CPP   1 year ago Type 2 diabetes mellitus with hyperglycemia, without long-term current use of insulin [facility])   [facility] Jonah Blue B, MD   1 year ago Type 2 diabetes mellitus without complication, without long-term current use of insulin [facility])    [facility] [place], [place], [place]   2 years ago Uncontrolled type 2 diabetes mellitus with hyperglycemia [facility])    [facility] Cain Saupe, MD       Future Appointments             In 1 month Laural Benes, Binnie Rail, MD [facility]

## 2021-08-18 ENCOUNTER — Ambulatory Visit: Payer: Self-pay | Admitting: *Deleted

## 2021-08-18 NOTE — Telephone Encounter (Signed)
C/o dizziness x 3 days . Room spinning at times and comes and goes. Hx diabetes. Denies lightheadedness, walking difficulty, weakness on either side of body. Unable to provide blood glucose readings. Denies frequent urination, N/V. No dizziness when lying flat only when standing up . Denies any other neurological deficits.  No available appt this week. Care advise given. Patient verbalized understanding of care advise and to call back or go to [facility] or ED if symptoms worsen. Patient reports she has not tried any medication such as Claritin or zyrtec for symptoms.

## 2021-08-18 NOTE — Telephone Encounter (Signed)
Reason for Disposition  [1] MILD dizziness (e.g., vertigo; walking normally) AND [2] has NOT been evaluated by physician for this  Answer Assessment - Initial Assessment Questions 1. DESCRIPTION: "Describe your dizziness."     When standing room spinning  2. VERTIGO: "Do you feel like either you or the room is spinning or tilting?"      Room spinning  3. LIGHTHEADED: "Do you feel lightheaded?" (e.g., somewhat faint, woozy, weak upon standing)     Denies  4. SEVERITY: "How bad is it?"  "Can you walk?"   - MILD: Feels slightly dizzy and unsteady, but is walking normally.   - MODERATE: Feels unsteady when walking, but not falling; interferes with normal activities (e.g., school, work).   - SEVERE: Unable to walk without falling, or requires assistance to walk without falling.     Mild  5. ONSET:  "When did the dizziness begin?"     3 days ago  6. AGGRAVATING FACTORS: "Does anything make it worse?" (e.g., standing, change in head position)     Standing  7. CAUSE: "What do you think is causing the dizziness?"     Not sure  8. RECURRENT SYMPTOM: "Have you had dizziness before?" If Yes, ask: "When was the last time?" "What happened that time?"     No  9. OTHER SYMPTOMS: "Do you have any other symptoms?" (e.g., headache, weakness, numbness, vomiting, earache)     No  10. PREGNANCY: "Is there any chance you are pregnant?" "When was your last menstrual period?"       na  Protocols used: Dizziness - Vertigo-A-AH

## 2021-08-19 NOTE — Telephone Encounter (Signed)
Returned pt call and went over provider response pt is aware. Pt states she will go to the urgent care when she gets a min.

## 2021-08-19 NOTE — Telephone Encounter (Signed)
FYI

## 2021-08-23 ENCOUNTER — Other Ambulatory Visit: Payer: Self-pay | Admitting: Internal Medicine

## 2021-08-23 DIAGNOSIS — E1165 Type 2 diabetes mellitus with hyperglycemia: Secondary | ICD-10-CM

## 2021-08-24 NOTE — Telephone Encounter (Signed)
Requested medications are due for refill today.  yes  Requested medications are on the active medications list.  yes  Last refill. 07/30/2020  Future visit scheduled.   yes  Notes to clinic.  Prescription is expired. 

## 2021-09-02 ENCOUNTER — Other Ambulatory Visit: Payer: Self-pay | Admitting: Internal Medicine

## 2021-09-02 DIAGNOSIS — I10 Essential (primary) hypertension: Secondary | ICD-10-CM

## 2021-09-02 NOTE — Telephone Encounter (Signed)
Appointment 09/20/21- courtesy RF given

## 2021-09-06 ENCOUNTER — Other Ambulatory Visit: Payer: Self-pay | Admitting: Internal Medicine

## 2021-09-06 DIAGNOSIS — E1165 Type 2 diabetes mellitus with hyperglycemia: Secondary | ICD-10-CM

## 2021-09-06 NOTE — Telephone Encounter (Addendum)
Patient checking on the status of medication refill request TRULICITY 0.75 MG/0.5ML SOPN [Pharmacy Med Name: Trulicity 0.75 mg/0.5 mL subcutaneous pen inject. Today is the first day patient has not taken medication. Patient would like a call back today [phone].  [facility] - [place], [place] - [address] Phone:  [phone]  Fax:  [phone]

## 2021-09-06 NOTE — Addendum Note (Signed)
Addended by: Wilford Corner on: 09/06/2021 02:44 PM   Modules accepted: Orders

## 2021-09-20 ENCOUNTER — Ambulatory Visit: Payer: Medicaid Other | Attending: Internal Medicine | Admitting: Internal Medicine

## 2021-09-20 ENCOUNTER — Other Ambulatory Visit: Payer: Self-pay

## 2021-09-20 DIAGNOSIS — M25511 Pain in right shoulder: Secondary | ICD-10-CM | POA: Diagnosis not present

## 2021-09-20 DIAGNOSIS — Z6841 Body Mass Index (BMI) 40.0 and over, adult: Secondary | ICD-10-CM | POA: Diagnosis not present

## 2021-09-20 DIAGNOSIS — H9202 Otalgia, left ear: Secondary | ICD-10-CM | POA: Insufficient documentation

## 2021-09-20 DIAGNOSIS — Z9114 Patient's other noncompliance with medication regimen: Secondary | ICD-10-CM | POA: Diagnosis not present

## 2021-09-20 DIAGNOSIS — E1165 Type 2 diabetes mellitus with hyperglycemia: Secondary | ICD-10-CM

## 2021-09-20 DIAGNOSIS — J4530 Mild persistent asthma, uncomplicated: Secondary | ICD-10-CM | POA: Diagnosis not present

## 2021-09-20 DIAGNOSIS — Z760 Encounter for issue of repeat prescription: Secondary | ICD-10-CM | POA: Diagnosis not present

## 2021-09-20 DIAGNOSIS — L309 Dermatitis, unspecified: Secondary | ICD-10-CM | POA: Diagnosis not present

## 2021-09-20 DIAGNOSIS — M47812 Spondylosis without myelopathy or radiculopathy, cervical region: Secondary | ICD-10-CM | POA: Diagnosis not present

## 2021-09-20 DIAGNOSIS — E1169 Type 2 diabetes mellitus with other specified complication: Secondary | ICD-10-CM | POA: Diagnosis not present

## 2021-09-20 DIAGNOSIS — Z79899 Other long term (current) drug therapy: Secondary | ICD-10-CM | POA: Diagnosis not present

## 2021-09-20 DIAGNOSIS — I152 Hypertension secondary to endocrine disorders: Secondary | ICD-10-CM | POA: Diagnosis not present

## 2021-09-20 DIAGNOSIS — Z8249 Family history of ischemic heart disease and other diseases of the circulatory system: Secondary | ICD-10-CM | POA: Insufficient documentation

## 2021-09-20 DIAGNOSIS — M542 Cervicalgia: Secondary | ICD-10-CM | POA: Insufficient documentation

## 2021-09-20 DIAGNOSIS — Z1231 Encounter for screening mammogram for malignant neoplasm of breast: Secondary | ICD-10-CM

## 2021-09-20 DIAGNOSIS — Z7985 Long-term (current) use of injectable non-insulin antidiabetic drugs: Secondary | ICD-10-CM | POA: Insufficient documentation

## 2021-09-20 DIAGNOSIS — G8929 Other chronic pain: Secondary | ICD-10-CM | POA: Diagnosis not present

## 2021-09-20 DIAGNOSIS — M1712 Unilateral primary osteoarthritis, left knee: Secondary | ICD-10-CM | POA: Insufficient documentation

## 2021-09-20 DIAGNOSIS — Z2821 Immunization not carried out because of patient refusal: Secondary | ICD-10-CM | POA: Insufficient documentation

## 2021-09-20 DIAGNOSIS — I10 Essential (primary) hypertension: Secondary | ICD-10-CM | POA: Diagnosis not present

## 2021-09-20 DIAGNOSIS — E1159 Type 2 diabetes mellitus with other circulatory complications: Secondary | ICD-10-CM | POA: Insufficient documentation

## 2021-09-20 DIAGNOSIS — Z1211 Encounter for screening for malignant neoplasm of colon: Secondary | ICD-10-CM

## 2021-09-20 DIAGNOSIS — Z7984 Long term (current) use of oral hypoglycemic drugs: Secondary | ICD-10-CM | POA: Insufficient documentation

## 2021-09-20 LAB — GLUCOSE, POCT (MANUAL RESULT ENTRY): POC Glucose: 239 mg/dl — AB (ref 70–99)

## 2021-09-20 LAB — POCT GLYCOSYLATED HEMOGLOBIN (HGB A1C): HbA1c, POC (controlled diabetic range): 9.5 % — AB (ref 0.0–7.0)

## 2021-09-20 MED ORDER — HYDROCORTISONE 2.5 % EX CREA: TOPICAL_CREAM | CUTANEOUS | 0 refills | Status: DC

## 2021-09-20 MED ORDER — TRULICITY 1.5 MG/0.5ML ~~LOC~~ SOAJ: 1.5000 mg | SUBCUTANEOUS | 6 refills | Status: DC

## 2021-09-20 MED ORDER — GLIMEPIRIDE 4 MG PO TABS: 4.0000 mg | ORAL_TABLET | Freq: Every day | ORAL | 0 refills | Status: DC

## 2021-09-20 MED ORDER — LOSARTAN POTASSIUM 25 MG PO TABS: 25.0000 mg | ORAL_TABLET | Freq: Every day | ORAL | 6 refills | Status: DC

## 2021-09-20 MED ORDER — ALBUTEROL SULFATE HFA 108 (90 BASE) MCG/ACT IN AERS: INHALATION_SPRAY | RESPIRATORY_TRACT | 1 refills | Status: DC

## 2021-09-20 MED ORDER — MOMETASONE FURO-FORMOTEROL FUM 100-5 MCG/ACT IN AERO: 2.0000 | INHALATION_SPRAY | Freq: Two times a day (BID) | RESPIRATORY_TRACT | 6 refills | Status: DC

## 2021-09-20 MED ORDER — AMLODIPINE BESYLATE 5 MG PO TABS: 5.0000 mg | ORAL_TABLET | Freq: Every day | ORAL | 6 refills | Status: DC

## 2021-09-20 MED ORDER — METFORMIN HCL ER 500 MG PO TB24: ORAL_TABLET | ORAL | 6 refills | Status: DC

## 2021-09-20 MED ORDER — TRAMADOL HCL 50 MG PO TABS: 50.0000 mg | ORAL_TABLET | Freq: Every day | ORAL | 0 refills | Status: DC | PRN

## 2021-09-20 MED ORDER — AMOXICILLIN 500 MG PO CAPS: 500.0000 mg | ORAL_CAPSULE | Freq: Three times a day (TID) | ORAL | 0 refills | Status: AC

## 2021-09-20 MED ORDER — METHOCARBAMOL 500 MG PO TABS: 500.0000 mg | ORAL_TABLET | Freq: Two times a day (BID) | ORAL | 0 refills | Status: DC | PRN

## 2021-09-20 NOTE — Patient Instructions (Signed)
Stop atenolol.  We have started a different blood pressure medication called amlodipine instead. Increase Trulicity to 1.5 mg once a week. Healthy Eating Following a healthy eating pattern may help you to achieve and maintain a healthy body weight, reduce the risk of chronic disease, and live a long and productive life. It is important to follow a healthy eating pattern at an appropriate calorie level for your body. Your nutritional needs should be met primarily through food by choosing a variety of nutrient-rich foods. What are tips for following this plan? Reading food labels Read labels and choose the following: Reduced or low sodium. Juices with 100% fruit juice. Foods with low saturated fats and high polyunsaturated and monounsaturated fats. Foods with whole grains, such as whole wheat, cracked wheat, brown rice, and wild rice. Whole grains that are fortified with folic acid. This is recommended for women who are pregnant or who want to become pregnant. Read labels and avoid the following: Foods with a lot of added sugars. These include foods that contain brown sugar, corn sweetener, corn syrup, dextrose, fructose, glucose, high-fructose corn syrup, honey, invert sugar, lactose, malt syrup, maltose, molasses, raw sugar, sucrose, trehalose, or turbinado sugar. Do not eat more than the following amounts of added sugar per day: 6 teaspoons (25 g) for women. 9 teaspoons (38 g) for men. Foods that contain processed or refined starches and grains. Refined grain products, such as white flour, degermed cornmeal, white bread, and white rice. Shopping Choose nutrient-rich snacks, such as vegetables, whole fruits, and nuts. Avoid high-calorie and high-sugar snacks, such as potato chips, fruit snacks, and candy. Use oil-based dressings and spreads on foods instead of solid fats such as butter, stick margarine, or cream cheese. Limit pre-made sauces, mixes, and "instant" products such as flavored rice,  instant noodles, and ready-made pasta. Try more plant-protein sources, such as tofu, tempeh, black beans, edamame, lentils, nuts, and seeds. Explore eating plans such as the Mediterranean diet or vegetarian diet. Cooking Use oil to saut or stir-fry foods instead of solid fats such as butter, stick margarine, or lard. Try baking, boiling, grilling, or broiling instead of frying. Remove the fatty part of meats before cooking. Steam vegetables in water or broth. Meal planning  At meals, imagine dividing your plate into fourths: One-half of your plate is fruits and vegetables. One-fourth of your plate is whole grains. One-fourth of your plate is protein, especially lean meats, poultry, eggs, tofu, beans, or nuts. Include low-fat dairy as part of your daily diet. Lifestyle Choose healthy options in all settings, including home, work, school, restaurants, or stores. Prepare your food safely: Wash your hands after handling raw meats. Keep food preparation surfaces clean by regularly washing with hot, soapy water. Keep raw meats separate from ready-to-eat foods, such as fruits and vegetables. Cook seafood, meat, poultry, and eggs to the recommended internal temperature. Store foods at safe temperatures. In general: Keep cold foods at 53F (4.4C) or below. Keep hot foods at 153F (60C) or above. Keep your freezer at [facility] (-17.8C) or below. Foods are no longer safe to eat when they have been between the temperatures of 40-153F (4.4-60C) for more than 2 hours. What foods should I eat? Fruits Aim to eat 2 cup-equivalents of fresh, canned (in natural juice), or frozen fruits each day. Examples of 1 cup-equivalent of fruit include 1 small apple, 8 large strawberries, 1 cup canned fruit,  cup dried fruit, or 1 cup 100% juice. Vegetables Aim to eat 2-3 cup-equivalents of fresh and  frozen vegetables each day, including different varieties and colors. Examples of 1 cup-equivalent of vegetables  include 2 medium carrots, 2 cups raw, leafy greens, 1 cup chopped vegetable (raw or cooked), or 1 medium baked potato. Grains Aim to eat 6 ounce-equivalents of whole grains each day. Examples of 1 ounce-equivalent of grains include 1 slice of bread, 1 cup ready-to-eat cereal, 3 cups popcorn, or  cup cooked rice, pasta, or cereal. Meats and other proteins Aim to eat 5-6 ounce-equivalents of protein each day. Examples of 1 ounce-equivalent of protein include 1 egg, 1/2 cup nuts or seeds, or 1 tablespoon (16 g) peanut butter. A cut of meat or fish that is the size of a deck of cards is about 3-4 ounce-equivalents. Of the protein you eat each week, try to have at least 8 ounces come from seafood. This includes salmon, trout, herring, and anchovies. Dairy Aim to eat 3 cup-equivalents of fat-free or low-fat dairy each day. Examples of 1 cup-equivalent of dairy include 1 cup (240 mL) milk, 8 ounces (250 g) yogurt, 1 ounces (44 g) natural cheese, or 1 cup (240 mL) fortified soy milk. Fats and oils Aim for about 5 teaspoons (21 g) per day. Choose monounsaturated fats, such as canola and olive oils, avocados, peanut butter, and most nuts, or polyunsaturated fats, such as sunflower, corn, and soybean oils, walnuts, pine nuts, sesame seeds, sunflower seeds, and flaxseed. Beverages Aim for six 8-oz glasses of water per day. Limit coffee to three to five 8-oz cups per day. Limit caffeinated beverages that have added calories, such as soda and energy drinks. Limit alcohol intake to no more than 1 drink a day for nonpregnant women and 2 drinks a day for men. One drink equals 12 oz of beer (355 mL), 5 oz of wine (148 mL), or 1 oz of hard liquor (44 mL). Seasoning and other foods Avoid adding excess amounts of salt to your foods. Try flavoring foods with herbs and spices instead of salt. Avoid adding sugar to foods. Try using oil-based dressings, sauces, and spreads instead of solid fats. This information is  based on general [place] nutrition guidelines. For more information, visit [employer]. Exact amounts may vary based on your nutrition needs. Summary A healthy eating plan may help you to maintain a healthy weight, reduce the risk of chronic diseases, and stay active throughout your life. Plan your meals. Make sure you eat the right portions of a variety of nutrient-rich foods. Try baking, boiling, grilling, or broiling instead of frying. Choose healthy options in all settings, including home, work, school, restaurants, or stores. This information is not intended to replace advice given to you by your health care provider. Make sure you discuss any questions you have with your health care provider. Document Revised: 03/18/2018 Document Reviewed: 03/18/2018 Elsevier Patient Education  [place].

## 2021-09-20 NOTE — Progress Notes (Signed)
Patient ID: Elaine Marquez, female    DOB: 1971/08/07  MRN: [id number]  CC: Diabetes, Hypertension, and Medication Refill   Subjective: Elaine Marquez is a 50 y.o. 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  She tells me that she has been busy with 3 grandbabies and her husband recently diagnosed with prostate cancer.  They are also in the process of having to move.  DM: Results for orders placed or performed in visit on 09/20/21  POCT glucose (manual entry)  Result Value Ref Range   POC Glucose 239 (A) 70 - 99 mg/dl  POCT glycosylated hemoglobin (Hb A1C)  Result Value Ref Range   Hemoglobin A1C     HbA1c POC (<> result, manual entry)     HbA1c, POC (prediabetic range)     HbA1c, POC (controlled diabetic range) 9.5 (A) 0.0 - 7.0 %  -taking Trulicity, misses Metformin about once a wk.  Taking Amaryl daily -gained wgh.  Not getting in exercise due to busy schedule. Admits that she is eating too much starches. No sugary drinks.   HTN:  out of Atenolol for a while but still taking Cozaar.  She limits salt in foods No CP.  SOB when she has asthma flare.  Currently having flare of asthma due to the change in weather.  She is having to use her ProAir several times a day..    C/o pain in LT ear x few wks.  No initiating factors. Pain is intermittent.  No drainage or decrease hearing  C/o rash on face x a few wks It itches.  Hx of atopic dermatitis.  Thinks it is a flare.  No new facial products.  Pain in neck, RT shoulder all the time.  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  Wghs about 22 lbs.  She feels this contributes to the right shoulder pain. Pain in LT knee with known history of osteoarthritis.  She had seen Dr. Marlou Sa in the past for  her left knee and plans to schedule appt in near future once she has moved and gets settle in her next place.  Requesting refill of tramadol which she uses sparingly for her pain.  HM: declines flu shot.  Had 3 COVID shots  Patient Active Problem List   Diagnosis Date Noted   Class 3 severe obesity due to excess calories with serious comorbidity and body mass index (BMI) of 40.0 to 44.9 in adult [facility]) 09/20/2021   Spondylosis without myelopathy or radiculopathy, cervical region 02/12/2019   Type 2 diabetes mellitus without complication, without long-term current use of insulin ([place]) 05/15/2018     Current Outpatient Medications on File Prior to Visit  Medication Sig Dispense Refill   Accu-Chek Softclix Lancets lancets TEST THREE TIMES DAILY AS DIRECTED 100 each 0   albuterol (PROAIR HFA) 108 (90 Base) MCG/ACT inhaler Inhale 2 puffs into the lungs every 6 (six) hours as needed for wheezing or shortness of breath. 8.5 g 1   albuterol (PROVENTIL) (2.5 MG/3ML) 0.083% nebulizer solution Take 3 mLs (2.5 mg total) by nebulization every 6 (six) hours as needed for wheezing. 75 mL 0   atenolol (TENORMIN) 25 MG tablet Take 1 tablet (25 mg total) by mouth daily. Must have office visit for refills 30 tablet 2  diclofenac Sodium (VOLTAREN) 1 % GEL Apply 2 g topically 4 (four) times daily. 100 g 0   glimepiride (AMARYL) 4 MG tablet Take 1 tablet (4 mg total) by mouth daily before breakfast. OFFICE VISIT NEEDED FOR ADDITIONAL REFILLS 60 tablet 0   glucose blood (ACCU-CHEK AVIVA PLUS) test strip 1 each by Other route 3 (three) times daily. Use as instructed. OFFICE VISIT NEEDED FOR ADDITIONAL REFILLS 100 strip 0   Lancets Misc. (ACCU-CHEK FASTCLIX LANCET) KIT Check blood glucose at least 3 times daily. 1 kit 6   losartan (COZAAR) 25 MG tablet Take 1 tablet (25 mg total) by mouth daily. 30 tablet 0   metFORMIN (GLUCOPHAGE-XR) 500 MG 24 hr tablet TAKE ONE TABLET BY MOUTH EVERY MORNING AND TAKE TWO TABLETS BY  MOUTH EVERY IN THE EVENING 90 tablet 6   methocarbamol (ROBAXIN) 500 MG tablet Take 1 tablet (500 mg total) by mouth 2 (two) times daily as needed for muscle spasms. 40 tablet 0   TRULICITY 5.32 DJ/2.4QA SOPN Inject 0.5 mLs (0.75 mg total) into the skin once a week. 15 mL 0   No current facility-administered medications on file prior to visit.    Allergies  Allergen Reactions   Ibuprofen Nausea And Vomiting    Social History   Socioeconomic History   Marital status: Married    Spouse name: Not on file   Number of children: Not on file   Years of education: Not on file   Highest education level: Not on file  Occupational History   Not on file  Tobacco Use   Smoking status: Never   Smokeless tobacco: Never  Substance and Sexual Activity   Alcohol use: Yes    Comment: occasionally    Drug use: No   Sexual activity: Not on file  Other Topics Concern   Not on file  Social History Narrative   Not on file   Social Determinants of Health   Financial Resource Strain: Not on file  Food Insecurity: Not on file  Transportation Needs: Not on file  Physical Activity: Not on file  Stress: Not on file  Social Connections: Not on file  Intimate Partner Violence: Not on file    Family History  Problem Relation Age of Onset   Hypertension Mother    Diabetes Mother    Hypertension Father    Diabetes Father    CVA Brother     Past Surgical History:  Procedure Laterality Date   TUBAL LIGATION      ROS: Review of Systems Negative except as stated above  PHYSICAL EXAM: BP (!) 150/106   Pulse 98   Resp 16   Wt 273 lb 12.8 oz (124.2 kg)   SpO2 97%   BMI 47.00 kg/m   Wt Readings from Last 3 Encounters:  09/20/21 273 lb 12.8 oz (124.2 kg)  10/11/20 260 lb 9.6 oz (118.2 kg)  09/18/20 250 lb (113.4 kg)    Physical Exam  General appearance - alert, well appearing, middle-aged African-American female and in no distress Mental status - normal mood, behavior, speech,  dress, motor activity, and thought processes Neck - supple, no significant adenopathy Ears: Left ear canal within normal limits.  Mild erythema and retraction of the tympanic membrane Chest -mild diffuse wheezing with good air entry. Heart - normal rate, regular rhythm, normal S1, S2, no murmurs, rubs, clicks or gallops Musculoskeletal -left knee: Enlargement of the joint.  Neck: No point tenderness along the cervical spine or the trapezius  muscles. Extremities - peripheral pulses normal, no pedal edema, no clubbing or cyanosis Skin -flat atopic-like rash noted around the mouth and the eyebrows Diabetic Foot Exam - Simple   Simple Foot Form Visual Inspection See comments: Yes Sensation Testing Intact to touch and monofilament testing bilaterally: Yes Pulse Check Posterior Tibialis and Dorsalis pulse intact bilaterally: Yes Comments Toenails are overgrown.      CMP Latest Ref Rng & Units 12/24/2019 01/28/2019 09/12/2018  Glucose 65 - 99 mg/dL 312(H) 250(H) 373(H)  BUN 6 - 24 mg/dL '13 9 14  ' Creatinine 0.57 - 1.00 mg/dL 0.69 0.68 0.65  Sodium 134 - 144 mmol/L 137 139 132(L)  Potassium 3.5 - 5.2 mmol/L 4.8 4.7 4.7  Chloride 96 - 106 mmol/L 98 99 91(L)  CO2 20 - 29 mmol/L '23 20 22  ' Calcium 8.7 - 10.2 mg/dL 10.6(H) 10.3(H) 9.9  Total Protein 6.0 - 8.5 g/dL 7.4 7.5 7.5  Total Bilirubin 0.0 - 1.2 mg/dL 0.3 0.3 0.3  Alkaline Phos 39 - 117 IU/L 33(L) 25(L) 34(L)  AST 0 - 40 IU/L '10 12 13  ' ALT 0 - 32 IU/L '12 18 12   ' Lipid Panel     Component Value Date/Time   CHOL 138 12/24/2019 1515   TRIG 83 12/24/2019 1515   HDL 78 12/24/2019 1515   CHOLHDL 1.8 12/24/2019 1515   LDLCALC 44 12/24/2019 1515    CBC    Component Value Date/Time   WBC 10.6 12/24/2019 1515   WBC 9.0 05/15/2018 0843   RBC 4.98 12/24/2019 1515   RBC 4.62 05/15/2018 0843   HGB 14.7 12/24/2019 1515   HCT 45.9 12/24/2019 1515   PLT 404 12/24/2019 1515   MCV 92 12/24/2019 1515   MCH 29.5 12/24/2019 1515   MCH 29.9  05/15/2018 0843   MCHC 32.0 12/24/2019 1515   MCHC 33.5 05/15/2018 0843   RDW 13.9 12/24/2019 1515   LYMPHSABS 3.3 (H) 12/24/2019 1515   EOSABS 0.2 12/24/2019 1515   BASOSABS 0.1 12/24/2019 1515    ASSESSMENT AND PLAN: 1. Type 2 diabetes mellitus with morbid obesity ([place]) Discussed the importance of healthy eating habits, regular aerobic exercise (at least 150 minutes a week as tolerated) and medication compliance to achieve or maintain control of diabetes and prevent complications. -Increase Trulicity to 1.5 mg weekly. Continue metformin and Amaryl.  Advised to check blood sugars at least once a day.  If she finds that blood sugars start dropping low with the increased dose of Trulicity, she should stop the Amaryl. - POCT glucose (manual entry) - POCT glycosylated hemoglobin (Hb A1C) - Microalbumin / creatinine urine ratio - Lipid panel - Comprehensive metabolic panel - Ambulatory referral to Ophthalmology - metFORMIN (GLUCOPHAGE-XR) 500 MG 24 hr tablet; TAKE ONE TABLET BY MOUTH EVERY MORNING AND TAKE TWO TABLETS BY MOUTH EVERY IN THE EVENING  Dispense: 90 tablet; Refill: 6 - Dulaglutide (TRULICITY) 1.5 LG/9.2JJ SOPN; Inject 1.5 mg into the skin once a week.  Dispense: 2 mL; Refill: 6 - glimepiride (AMARYL) 4 MG tablet; Take 1 tablet (4 mg total) by mouth daily before breakfast. OFFICE VISIT NEEDED FOR ADDITIONAL REFILLS  Dispense: 60 tablet; Refill: 0  2. Hypertension associated with diabetes ([place]) Not at goal.  She has been out of one of her blood pressure medication the atenolol.  I recommend that we discontinue atenolol given that she has asthma.  Start amlodipine instead.  Continue Cozaar - Lipid panel - CBC - Comprehensive metabolic panel - losartan (COZAAR) 25  MG tablet; Take 1 tablet (25 mg total) by mouth daily.  Dispense: 30 tablet; Refill: 6 - amLODipine (NORVASC) 5 MG tablet; Take 1 tablet (5 mg total) by mouth daily. For blood pressure  Dispense: 30 tablet; Refill: 6  3.  Mild persistent asthma without complication Recommend adding a controller inhaler in the form of Dulera.  Went over the difference between the controller and rescue inhaler.  Advised to rinse mouth out after each use of Dulera to prevent oral thrush. - albuterol (PROAIR HFA) 108 (90 Base) MCG/ACT inhaler; Inhale 2 puffs into the lungs every 6 (six) hours as needed for wheezing or shortness of breath.  Dispense: 8.5 g; Refill: 1 - mometasone-formoterol (DULERA) 100-5 MCG/ACT AERO; Inhale 2 puffs into the lungs 2 (two) times daily.  Dispense: 13 g; Refill: 6  4. Facial dermatitis Will prescribe some hydrocortisone cream.  Patient advised to use it only for 5 days as prolong use can cause thinning of skin on face.  She expressed understanding. - hydrocortisone 2.5 % cream; Apply once daily to affected area x 5 days.  Dispense: 30 g; Refill: 0  5. Chronic neck pain Refill Robaxin.  I have given a limited supply of tramadol. [place] reviewed. - methocarbamol (ROBAXIN) 500 MG tablet; Take 1 tablet (500 mg total) by mouth 2 (two) times daily as needed for muscle spasms.  Dispense: 40 tablet; Refill: 0 - traMADol (ULTRAM) 50 MG tablet; Take 1 tablet (50 mg total) by mouth daily as needed.  Dispense: 15 tablet; Refill: 0  6. Primary osteoarthritis of left knee Strongly encourage weight loss. - traMADol (ULTRAM) 50 MG tablet; Take 1 tablet (50 mg total) by mouth daily as needed.  Dispense: 15 tablet; Refill: 0  7. Ear pain, left -Seems to have otitis media. - amoxicillin (AMOXIL) 500 MG capsule; Take 1 capsule (500 mg total) by mouth 3 (three) times daily for 7 days.  Dispense: 21 capsule; Refill: 0  8. Encounter for screening mammogram for malignant neoplasm of breast - MM Digital Screening; Future  9. Screening for colon cancer - Ambulatory referral to Gastroenterology  10. Influenza vaccination declined Recommended.  Patient declined.      Patient was given the opportunity to ask  questions.  Patient verbalized understanding of the plan and was able to repeat key elements of the plan.   Orders Placed This Encounter  Procedures   Microalbumin / creatinine urine ratio   Lipid panel   CBC   Comprehensive metabolic panel   POCT glucose (manual entry)   POCT glycosylated hemoglobin (Hb A1C)     Requested Prescriptions    No prescriptions requested or ordered in this encounter    No follow-ups on file.  Karle Plumber, MD, FACP

## 2021-09-20 NOTE — Progress Notes (Signed)
Pt states her pain is coming from her left knee, right shoulder, lower back and neck

## 2021-09-21 ENCOUNTER — Encounter: Payer: Self-pay | Admitting: Internal Medicine

## 2021-09-21 LAB — CBC
Hematocrit: 41.6 % (ref 34.0–46.6)
Hemoglobin: 13.6 g/dL (ref 11.1–15.9)
MCH: 29.2 pg (ref 26.6–33.0)
MCHC: 32.7 g/dL (ref 31.5–35.7)
MCV: 90 fL (ref 79–97)
Platelets: 406 10*3/uL (ref 150–450)
RBC: 4.65 x10E6/uL (ref 3.77–5.28)
RDW: 13 % (ref 11.7–15.4)
WBC: 10.3 10*3/uL (ref 3.4–10.8)

## 2021-09-21 LAB — COMPREHENSIVE METABOLIC PANEL
ALT: 27 IU/L (ref 0–32)
AST: 22 IU/L (ref 0–40)
Albumin/Globulin Ratio: 1.5 (ref 1.2–2.2)
Albumin: 4.4 g/dL (ref 3.8–4.8)
Alkaline Phosphatase: 30 IU/L — ABNORMAL LOW (ref 44–121)
BUN/Creatinine Ratio: 11 (ref 9–23)
BUN: 7 mg/dL (ref 6–24)
Bilirubin Total: 0.3 mg/dL (ref 0.0–1.2)
CO2: 22 mmol/L (ref 20–29)
Calcium: 10 mg/dL (ref 8.7–10.2)
Chloride: 100 mmol/L (ref 96–106)
Creatinine, Ser: 0.66 mg/dL (ref 0.57–1.00)
Globulin, Total: 2.9 g/dL (ref 1.5–4.5)
Glucose: 224 mg/dL — ABNORMAL HIGH (ref 70–99)
Potassium: 4.7 mmol/L (ref 3.5–5.2)
Sodium: 139 mmol/L (ref 134–144)
Total Protein: 7.3 g/dL (ref 6.0–8.5)
eGFR: 107 mL/min/{1.73_m2} (ref >59)

## 2021-09-21 LAB — MICROALBUMIN / CREATININE URINE RATIO
Creatinine, Urine: 108.5 mg/dL
Microalb/Creat Ratio: 16 mg/g creat (ref 0–29)
Microalbumin, Urine: 17.5 ug/mL

## 2021-09-21 LAB — LIPID PANEL
Chol/HDL Ratio: 1.8 ratio (ref 0.0–4.4)
Cholesterol, Total: 120 mg/dL (ref 100–199)
HDL: 68 mg/dL (ref >39)
LDL Chol Calc (NIH): 40 mg/dL (ref 0–99)
Triglycerides: 55 mg/dL (ref 0–149)
VLDL Cholesterol Cal: 12 mg/dL (ref 5–40)

## 2021-09-21 NOTE — Progress Notes (Signed)
Cholesterol level normal. Blood cell counts including red blood cells, white blood cells and platelet counts are normal. Kidney and liver function test normal. Await results of urine to see if there is any protein in the urine.  If it comes back abnormal, we will let her know.

## 2021-09-22 ENCOUNTER — Telehealth: Payer: Self-pay

## 2021-09-22 NOTE — Telephone Encounter (Signed)
Contacted pt to go over lab results pt didn't answer lvm   Sent a CRM and forward labs to NT to give pt labs when they call back   

## 2021-09-26 NOTE — Telephone Encounter (Signed)
Please advise Cb- [phone]

## 2021-09-26 NOTE — Telephone Encounter (Signed)
Results given, see result notes. 

## 2021-10-10 ENCOUNTER — Telehealth: Payer: Self-pay | Admitting: Internal Medicine

## 2021-10-11 ENCOUNTER — Other Ambulatory Visit: Payer: Self-pay | Admitting: Internal Medicine

## 2021-10-11 DIAGNOSIS — E119 Type 2 diabetes mellitus without complications: Secondary | ICD-10-CM

## 2021-10-11 NOTE — Telephone Encounter (Signed)
Pharmacy called and stated that the pts insurance will not cover Blood Glucose Monitoring Suppl (ACCU-CHEK AVIVA PLUS) w/Device KIT/ and there is no coupon /they need an RX sent for [employer] GUIDE meter with lancets and test strips / please advise

## 2021-10-11 NOTE — Telephone Encounter (Signed)
Requested Prescriptions  Pending Prescriptions Disp Refills  . Accu-Chek Softclix Lancets lancets [Pharmacy Med Name: Accu-Chek Softclix Lancets] 100 each 11    Sig: TEST THREE TIMES DAILY AS DIRECTED     Endocrinology: Diabetes - Testing Supplies Passed - 10/11/2021  9:17 AM      Passed - Valid encounter within last 12 months    Recent Outpatient Visits          3 weeks ago Type 2 diabetes mellitus with morbid obesity (HCC)   [facility] Marcine Matar, MD   1 year ago Acute neck pain   [facility] Marcine Matar, MD   1 year ago Need for vaccination against Streptococcus pneumoniae   [facility] Drucilla Chalet, RPH-CPP   1 year ago Type 2 diabetes mellitus with hyperglycemia, without long-term current use of insulin [facility])   [place] [facility] Jonah Blue B, MD   1 year ago Type 2 diabetes mellitus without complication, without long-term current use of insulin [facility])   [facility] [place], Marylene Land M, [place]             . glucose blood (ACCU-CHEK AVIVA PLUS) test strip [place] Med Name: Accu-Chek Aviva Plus test strips] 100 strip 11    Sig: 1 each by Other route 3 (three) times daily. Use as instructed.     Endocrinology: Diabetes - Testing Supplies Passed - 10/11/2021  9:17 AM      Passed - Valid encounter within last 12 months    Recent Outpatient Visits          3 weeks ago Type 2 diabetes mellitus with morbid obesity (HCC)   [place] Marcine Matar, MD   1 year ago Acute neck pain   [facility] Marcine Matar, MD   1 year ago Need for vaccination against Streptococcus pneumoniae   [facility] Drucilla Chalet, RPH-CPP   1 year ago Type 2 diabetes mellitus with hyperglycemia, without long-term current use of  insulin [facility])   [place] [facility] Jonah Blue B, MD   1 year ago Type 2 diabetes mellitus without complication, without long-term current use of insulin [facility])   [facility] [place], [place], [place]

## 2021-10-11 NOTE — Telephone Encounter (Signed)
Pharmacy called back to follow up regarding the testing supplies, Caller stated the Accu check Guide meter and test strips need to be called in.

## 2021-10-11 NOTE — Telephone Encounter (Signed)
Requested Prescriptions  Pending Prescriptions Disp Refills  . Blood Glucose Monitoring Suppl (ACCU-CHEK AVIVA PLUS) w/Device KIT [Pharmacy Med Name: Accu-Chek Aviva Plus Meter] 1 kit 0    Sig: TEST THREE TIMES DAILY     Endocrinology: Diabetes - Testing Supplies Passed - 10/10/2021  4:30 PM      Passed - Valid encounter within last 12 months    Recent Outpatient Visits          3 weeks ago Type 2 diabetes mellitus with morbid obesity ([place])   [place], MD   1 year ago Acute neck pain   [place], MD   1 year ago Need for vaccination against Streptococcus pneumoniae   [place], Jarome Matin, RPH-CPP   1 year ago Type 2 diabetes mellitus with hyperglycemia, without long-term current use of insulin [facility])   [place], [place]   1 year ago Type 2 diabetes mellitus without complication, without long-term current use of insulin [facility])   [place], [place], [place]

## 2021-10-12 ENCOUNTER — Other Ambulatory Visit: Payer: Self-pay | Admitting: Internal Medicine

## 2021-10-12 DIAGNOSIS — E119 Type 2 diabetes mellitus without complications: Secondary | ICD-10-CM

## 2021-10-12 MED ORDER — ACCU-CHEK GUIDE W/DEVICE KIT: PACK | 0 refills | Status: DC

## 2021-10-12 MED ORDER — ACCU-CHEK GUIDE VI STRP: ORAL_STRIP | 2 refills | Status: DC

## 2021-10-12 NOTE — Addendum Note (Signed)
Addended by: Lois Huxley, Jeannett Senior L on: 10/12/2021 09:45 AM   Modules accepted: Orders

## 2021-10-12 NOTE — Telephone Encounter (Signed)
Done

## 2021-10-20 ENCOUNTER — Other Ambulatory Visit: Payer: Self-pay | Admitting: Internal Medicine

## 2021-10-20 DIAGNOSIS — E1169 Type 2 diabetes mellitus with other specified complication: Secondary | ICD-10-CM

## 2021-10-20 NOTE — Telephone Encounter (Signed)
Requested Prescriptions  Pending Prescriptions Disp Refills  . glimepiride (AMARYL) 4 MG tablet [Pharmacy Med Name: glimepiride 4 mg tablet] 90 tablet 1    Sig: Take 1 tablet (4 mg total) by mouth daily before breakfast.     Endocrinology:  Diabetes - Sulfonylureas Failed - 10/20/2021 12:13 PM      Failed - HBA1C is between 0 and 7.9 and within 180 days    HbA1c, POC (controlled diabetic range)  Date Value Ref Range Status  09/20/2021 9.5 (A) 0.0 - 7.0 % Final         Passed - Valid encounter within last 6 months    Recent Outpatient Visits          1 month ago Type 2 diabetes mellitus with morbid obesity (HCC)   [place] [facility] Marcine Matar, MD   1 year ago Acute neck pain   [place] [facility] [facility] Marcine Matar, MD   1 year ago Need for vaccination against Streptococcus pneumoniae   [facility] [place], Cornelius Moras, RPH-CPP   1 year ago Type 2 diabetes mellitus with hyperglycemia, without long-term current use of insulin [facility])   [place] [facility] Jonah Blue B, MD   1 year ago Type 2 diabetes mellitus without complication, without long-term current use of insulin [facility])   [facility] [place], [place], [place]

## 2021-11-03 ENCOUNTER — Ambulatory Visit: Payer: Medicaid Other

## 2021-11-15 ENCOUNTER — Other Ambulatory Visit: Payer: Self-pay | Admitting: Internal Medicine

## 2021-11-15 DIAGNOSIS — G8929 Other chronic pain: Secondary | ICD-10-CM

## 2021-11-15 DIAGNOSIS — M1712 Unilateral primary osteoarthritis, left knee: Secondary | ICD-10-CM

## 2021-11-15 DIAGNOSIS — M542 Cervicalgia: Secondary | ICD-10-CM

## 2021-11-16 NOTE — Telephone Encounter (Signed)
Will forward to provider  

## 2021-12-07 ENCOUNTER — Ambulatory Visit
Admission: RE | Admit: 2021-12-07 | Discharge: 2021-12-07 | Disposition: A | Discharge status: Home / Self Care | Payer: Medicaid Other | Source: Ambulatory Visit | Attending: Internal Medicine | Admitting: Internal Medicine

## 2021-12-07 DIAGNOSIS — Z1231 Encounter for screening mammogram for malignant neoplasm of breast: Secondary | ICD-10-CM

## 2021-12-13 ENCOUNTER — Other Ambulatory Visit: Payer: Self-pay | Admitting: Internal Medicine

## 2021-12-14 NOTE — Telephone Encounter (Signed)
Requested Prescriptions  Pending Prescriptions Disp Refills   glucose blood (ACCU-CHEK GUIDE) test strip [Pharmacy Med Name: Accu-Chek Guide test strips] 100 strip 2    Sig: USE TO check blood sugar three times daily     Endocrinology: Diabetes - Testing Supplies Passed - 12/13/2021  4:55 PM      Passed - Valid encounter within last 12 months    Recent Outpatient Visits          2 months ago Type 2 diabetes mellitus with morbid obesity (HCC)   [place] [facility] Marcine Matar, MD   1 year ago Acute neck pain   [place] [facility] Marcine Matar, MD   1 year ago Need for vaccination against Streptococcus pneumoniae   [place] [facility] [place], Cornelius Moras, RPH-CPP   1 year ago Type 2 diabetes mellitus with hyperglycemia, without long-term current use of insulin [facility])   [place] [facility] Jonah Blue B, MD   1 year ago Type 2 diabetes mellitus without complication, without long-term current use of insulin [facility])   [facility] [place], [place], [place]

## 2022-01-16 ENCOUNTER — Ambulatory Visit: Payer: Self-pay | Admitting: *Deleted

## 2022-01-16 DIAGNOSIS — G8929 Other chronic pain: Secondary | ICD-10-CM

## 2022-01-16 DIAGNOSIS — M542 Cervicalgia: Secondary | ICD-10-CM

## 2022-01-16 DIAGNOSIS — M1712 Unilateral primary osteoarthritis, left knee: Secondary | ICD-10-CM

## 2022-01-16 MED ORDER — TRAMADOL HCL 50 MG PO TABS: 50.0000 mg | ORAL_TABLET | Freq: Every day | ORAL | 0 refills | Status: DC | PRN

## 2022-01-16 NOTE — Addendum Note (Signed)
Addended by: Jonah Blue B on: 01/16/2022 06:15 PM   Modules accepted: Orders

## 2022-01-16 NOTE — Telephone Encounter (Signed)
°  Chief Complaint: Neck pain 10/10  Symptoms: Neck pain left sided, radiates down left arm. 10/10, constant, varies in level of intensity Frequency: Onset 3 days ago Pertinent Negatives: Patient denies fever, weakness arm Disposition: [] ED /[x] Urgent Care (no appt availability in office) / [] Appointment(In office/virtual)/ []  [place] [facility]/ [] Home Care/ [] Refused Recommended Disposition /[] [place] [facility]/ []  Follow-up with PCP Additional Notes: Pt expresses frustration. States "Just need tramadol, maybe something stronger as soon as I can get it." Advised UC, [facility]. Declines, frustrated affect. "They know I have this, I can't get help. Five pills of Tramadol is all I want." Please advise.      Reason for Disposition  [1] SEVERE neck pain (e.g., excruciating, unable to do any normal activities) AND [2] not improved after 2 hours of pain medicine  Answer Assessment - Initial Assessment Questions 1. ONSET: "When did the pain begin?"      3 days ago 2. LOCATION: "Where does it hurt?"      Base of neck at spine, radiates down left arm 3. PATTERN "Does the pain come and go, or has it been constant since it started?"      Intermittent, intensity varies 4. SEVERITY: "How bad is the pain?"  (Scale 1-10; or mild, moderate, severe)   - NO PAIN (0): no pain or only slight stiffness    - MILD (1-3): doesn't interfere with normal activities    - MODERATE (4-7): interferes with normal activities or awakens from sleep    - SEVERE (8-10):  excruciating pain, unable to do any normal activities      10/10 5. RADIATION: "Does the pain go anywhere else, shoot into your arms?"     Left arm 6. CORD SYMPTOMS: "Any weakness or numbness of the arms or legs?"      7. CAUSE: "What do you think is causing the neck pain?"     H/O 8. NECK OVERUSE: "Any recent activities that involved turning or twisting the neck?"     Picking up grandson  a lot 9. OTHER SYMPTOMS: "Do you have any other  symptoms?" (e.g., headache, fever, chest pain, difficulty breathing, neck swelling)     None  Protocols used: Neck Pain or Stiffness-A-AH

## 2022-02-06 ENCOUNTER — Telehealth: Payer: Self-pay | Admitting: Internal Medicine

## 2022-02-06 DIAGNOSIS — E1169 Type 2 diabetes mellitus with other specified complication: Secondary | ICD-10-CM

## 2022-02-07 NOTE — Telephone Encounter (Signed)
Pt has refills until 03/2022 Requested Prescriptions  Pending Prescriptions Disp Refills   metFORMIN (GLUCOPHAGE-XR) 500 MG 24 hr tablet [Pharmacy Med Name: metformin ER 500 mg tablet,extended release 24 hr] 90 tablet 6    Sig: TAKE ONE TABLET BY MOUTH EVERY MORNING AND TAKE TWO TABLETS BY MOUTH EVERY IN THE EVENING     Endocrinology:  Diabetes - Biguanides Failed - 02/06/2022  9:52 AM      Failed - HBA1C is between 0 and 7.9 and within 180 days    HbA1c, POC (controlled diabetic range)  Date Value Ref Range Status  09/20/2021 9.5 (A) 0.0 - 7.0 % Final         Failed - B12 Level in normal range and within 720 days    No results found for: VITAMINB12       Failed - CBC within normal limits and completed in the last 12 months    WBC  Date Value Ref Range Status  09/20/2021 10.3 3.4 - 10.8 x10E3/uL Final  05/15/2018 9.0 4.0 - 10.5 K/uL Final   RBC  Date Value Ref Range Status  09/20/2021 4.65 3.77 - 5.28 x10E6/uL Final  05/15/2018 4.62 3.87 - 5.11 MIL/uL Final   Hemoglobin  Date Value Ref Range Status  09/20/2021 13.6 11.1 - 15.9 g/dL Final   Hematocrit  Date Value Ref Range Status  09/20/2021 41.6 34.0 - 46.6 % Final   MCHC  Date Value Ref Range Status  09/20/2021 32.7 31.5 - 35.7 g/dL Final  05/15/2018 33.5 30.0 - 36.0 g/dL Final   [facility]  Date Value Ref Range Status  09/20/2021 29.2 26.6 - 33.0 pg Final  05/15/2018 29.9 26.0 - 34.0 pg Final   MCV  Date Value Ref Range Status  09/20/2021 90 79 - 97 fL Final   No results found for: PLTCOUNTKUC, LABPLAT, POCPLA RDW  Date Value Ref Range Status  09/20/2021 13.0 11.7 - 15.4 % Final         Passed - Cr in normal range and within 360 days    Creatinine, Ser  Date Value Ref Range Status  09/20/2021 0.66 0.57 - 1.00 mg/dL Final         Passed - eGFR in normal range and within 360 days    GFR calc Af Amer  Date Value Ref Range Status  12/24/2019 118 >59 mL/min/1.73 Final   GFR calc non Af Amer  Date Value Ref  Range Status  12/24/2019 103 >59 mL/min/1.73 Final   eGFR  Date Value Ref Range Status  09/20/2021 107 >59 mL/min/1.73 Final         Passed - Valid encounter within last 6 months    Recent Outpatient Visits          4 months ago Type 2 diabetes mellitus with morbid obesity ([place])   [place], Deborah B, MD   1 year ago Acute neck pain   [place], Deborah B, MD   1 year ago Need for vaccination against Streptococcus pneumoniae   [place], Stephen L, RPH-CPP   1 year ago Type 2 diabetes mellitus with hyperglycemia, without long-term current use of insulin [facility])   [place], Deborah B, MD   2 years ago Type 2 diabetes mellitus without complication, without long-term current use of insulin [facility])   [place], [place], [place]  Future Appointments            In 1 month Wynetta Emery, Dalbert Batman, MD [place]

## 2022-02-07 NOTE — Telephone Encounter (Signed)
Noted  

## 2022-03-02 ENCOUNTER — Other Ambulatory Visit: Payer: Self-pay | Admitting: Internal Medicine

## 2022-03-02 DIAGNOSIS — E1169 Type 2 diabetes mellitus with other specified complication: Secondary | ICD-10-CM

## 2022-03-02 NOTE — Telephone Encounter (Signed)
Rx 09/20/21 39ml  6RF  - 6 month supply- too soon ?Requested Prescriptions  ?Pending Prescriptions Disp Refills  ?? TRULICITY 1.5 MG/0.5ML SOPN [Pharmacy Med Name: Trulicity 1.5 mg/0.5 mL subcutaneous pen injector] 2 mL 6  ?  Sig: Inject 1.5 mg into the skin once a week.  ?  ? Endocrinology:  Diabetes - GLP-1 Receptor Agonists Failed - 03/02/2022 12:32 PM  ?  ?  Failed - HBA1C is between 0 and 7.9 and within 180 days  ?  HbA1c, POC (controlled diabetic range)  ?Date Value Ref Range Status  ?09/20/2021 9.5 (A) 0.0 - 7.0 % Final  ?   ?  ?  Passed - Valid encounter within last 6 months  ?  Recent Outpatient Visits   ?      ? 5 months ago Type 2 diabetes mellitus with morbid obesity (HCC)  ? [facility] Marcine Matar, MD  ? 1 year ago Acute neck pain  ? [facility] Marcine Matar, MD  ? 1 year ago Need for vaccination against Streptococcus pneumoniae  ? [facility] Lois Huxley, Cornelius Moras, RPH-CPP  ? 1 year ago Type 2 diabetes mellitus with hyperglycemia, without long-term current use of insulin (HCC)  ? [facility] Jonah Blue B, MD  ? 2 years ago Type 2 diabetes mellitus without complication, without long-term current use of insulin (HCC)  ? [facility] [place], [place], [place]  ?  ?  ?Future Appointments   ?        ? In 1 week Marcine Matar, MD [facility]  ?  ? ?  ?  ?  ? ?

## 2022-03-07 ENCOUNTER — Other Ambulatory Visit: Payer: Self-pay | Admitting: Internal Medicine

## 2022-03-07 DIAGNOSIS — J4530 Mild persistent asthma, uncomplicated: Secondary | ICD-10-CM

## 2022-03-07 DIAGNOSIS — I152 Hypertension secondary to endocrine disorders: Secondary | ICD-10-CM

## 2022-03-13 ENCOUNTER — Encounter: Payer: Self-pay | Admitting: Internal Medicine

## 2022-03-13 ENCOUNTER — Ambulatory Visit: Payer: Medicaid Other | Attending: Internal Medicine | Admitting: Internal Medicine

## 2022-03-13 ENCOUNTER — Other Ambulatory Visit: Payer: Self-pay

## 2022-03-13 DIAGNOSIS — E1169 Type 2 diabetes mellitus with other specified complication: Secondary | ICD-10-CM

## 2022-03-13 DIAGNOSIS — Z6841 Body Mass Index (BMI) 40.0 and over, adult: Secondary | ICD-10-CM

## 2022-03-13 DIAGNOSIS — E1159 Type 2 diabetes mellitus with other circulatory complications: Secondary | ICD-10-CM

## 2022-03-13 DIAGNOSIS — I152 Hypertension secondary to endocrine disorders: Secondary | ICD-10-CM

## 2022-03-13 LAB — GLUCOSE, POCT (MANUAL RESULT ENTRY): POC Glucose: 306 mg/dl — AB (ref 70–99)

## 2022-03-13 LAB — POCT GLYCOSYLATED HEMOGLOBIN (HGB A1C): HbA1c, POC (controlled diabetic range): 11 % — AB (ref 0.0–7.0)

## 2022-03-13 MED ORDER — PEN NEEDLES 31G X 8 MM MISC: 6 refills | Status: DC

## 2022-03-13 MED ORDER — INSULIN GLARGINE SOLOSTAR 100 UNIT/ML ~~LOC~~ SOPN: 12.0000 [IU] | PEN_INJECTOR | Freq: Every evening | SUBCUTANEOUS | 3 refills | Status: DC

## 2022-03-13 NOTE — Patient Instructions (Signed)
Stop Amaryl. ?Start Glargine insulin 12 units at bedtime.  Try to check your blood sugar at least twice a day before meals with goal being 90-130.  ?While on insulin, always keep a small snack, glucose tablets or some candy on you to eat if blood sugar drops low.  ? ?Diabetes Mellitus and Nutrition, Adult ?When you have diabetes, or diabetes mellitus, it is very important to have healthy eating habits because your blood sugar (glucose) levels are greatly affected by what you eat and drink. Eating healthy foods in the right amounts, at about the same times every day, can help you: ?Manage your blood glucose. ?Lower your risk of heart disease. ?Improve your blood pressure. ?Reach or maintain a healthy weight. ?What can affect my meal plan? ?Every person with diabetes is different, and each person has different needs for a meal plan. Your health care provider may recommend that you work with a dietitian to make a meal plan that is best for you. Your meal plan may vary depending on factors such as: ?The calories you need. ?The medicines you take. ?Your weight. ?Your blood glucose, blood pressure, and cholesterol levels. ?Your activity level. ?Other health conditions you have, such as heart or kidney disease. ?How do carbohydrates affect me? ?Carbohydrates, also called carbs, affect your blood glucose level more than any other type of food. Eating carbs raises the amount of glucose in your blood. ?It is important to know how many carbs you can safely have in each meal. This is different for every person. Your dietitian can help you calculate how many carbs you should have at each meal and for each snack. ?How does alcohol affect me? ?Alcohol can cause a decrease in blood glucose (hypoglycemia), especially if you use insulin or take certain diabetes medicines by mouth. Hypoglycemia can be a life-threatening condition. Symptoms of hypoglycemia, such as sleepiness, dizziness, and confusion, are similar to symptoms of having  too much alcohol. ?Do not drink alcohol if: ?Your health care provider tells you not to drink. ?You are pregnant, may be pregnant, or are planning to become pregnant. ?If you drink alcohol: ?Limit how much you have to: ?0-1 drink a day for women. ?0-2 drinks a day for men. ?Know how much alcohol is in your drink. In the [place] one drink equals one 12 oz bottle of beer (355 mL), one 5 oz glass of wine (148 mL), or one 1? oz glass of hard liquor (44 mL). ?Keep yourself hydrated with water, diet soda, or unsweetened iced tea. Keep in mind that regular soda, juice, and other mixers may contain a lot of sugar and must be counted as carbs. ?What are tips for following this plan? ?Reading food labels ?Start by checking the serving size on the Nutrition Facts label of packaged foods and drinks. The number of calories and the amount of carbs, fats, and other nutrients listed on the label are based on one serving of the item. Many items contain more than one serving per package. ?Check the total grams (g) of carbs in one serving. ?Check the number of grams of saturated fats and trans fats in one serving. Choose foods that have a low amount or none of these fats. ?Check the number of milligrams (mg) of salt (sodium) in one serving. Most people should limit total sodium intake to less than 2,300 mg per day. ?Always check the nutrition information of foods labeled as "low-fat" or "nonfat." These foods may be higher in added sugar or refined carbs and  should be avoided. ?Talk to your dietitian to identify your daily goals for nutrients listed on the label. ?Shopping ?Avoid buying canned, pre-made, or processed foods. These foods tend to be high in fat, sodium, and added sugar. ?Shop around the outside edge of the grocery store. This is where you will most often find fresh fruits and vegetables, bulk grains, fresh meats, and fresh dairy products. ?Cooking ?Use low-heat cooking methods, such as baking, instead of high-heat cooking  methods, such as deep frying. ?Cook using healthy oils, such as olive, canola, or sunflower oil. ?Avoid cooking with butter, cream, or high-fat meats. ?Meal planning ?Eat meals and snacks regularly, preferably at the same times every day. Avoid going long periods of time without eating. ?Eat foods that are high in fiber, such as fresh fruits, vegetables, beans, and whole grains. ?Eat 4-6 oz (112-168 g) of lean protein each day, such as lean meat, chicken, fish, eggs, or tofu. One ounce (oz) (28 g) of lean protein is equal to: ?1 oz (28 g) of meat, chicken, or fish. ?1 egg. ?? cup (62 g) of tofu. ?Eat some foods each day that contain healthy fats, such as avocado, nuts, seeds, and fish. ?What foods should I eat? ?Fruits ?Berries. Apples. Oranges. Peaches. Apricots. Plums. Grapes. Mangoes. Papayas. Pomegranates. Kiwi. Cherries. ?Vegetables ?Leafy greens, including lettuce, spinach, kale, chard, collard greens, mustard greens, and cabbage. Beets. Cauliflower. Broccoli. Carrots. Green beans. Tomatoes. Peppers. Onions. Cucumbers. Brussels sprouts. ?Grains ?Whole grains, such as whole-wheat or whole-grain bread, crackers, tortillas, cereal, and pasta. Unsweetened oatmeal. Quinoa. Brown or wild rice. ?Meats and other proteins ?Seafood. Poultry without skin. Lean cuts of poultry and beef. Tofu. Nuts. Seeds. ?Dairy ?Low-fat or fat-free dairy products such as milk, yogurt, and cheese. ?The items listed above may not be a complete list of foods and beverages you can eat and drink. Contact a dietitian for more information. ?What foods should I avoid? ?Fruits ?Fruits canned with syrup. ?Vegetables ?Canned vegetables. Frozen vegetables with butter or cream sauce. ?Grains ?Refined white flour and flour products such as bread, pasta, snack foods, and cereals. Avoid all processed foods. ?Meats and other proteins ?Fatty cuts of meat. Poultry with skin. Breaded or fried meats. Processed meat. Avoid saturated fats. ?Dairy ?Full-fat  yogurt, cheese, or milk. ?Beverages ?Sweetened drinks, such as soda or iced tea. ?The items listed above may not be a complete list of foods and beverages you should avoid. Contact a dietitian for more information. ?Questions to ask a health care provider ?Do I need to meet with a certified diabetes care and education specialist? ?Do I need to meet with a dietitian? ?What number can I call if I have questions? ?When are the best times to check my blood glucose? ?Where to find more information: ?American Diabetes Association: diabetes.org ?Academy of Nutrition and Dietetics: eatright.org ?[employer] of Diabetes and Digestive and Kidney Diseases: AmenCredit.is ?Association of Diabetes Care & Education Specialists: diabeteseducator.org ?Summary ?It is important to have healthy eating habits because your blood sugar (glucose) levels are greatly affected by what you eat and drink. It is important to use alcohol carefully. ?A healthy meal plan will help you manage your blood glucose and lower your risk of heart disease. ?Your health care provider may recommend that you work with a dietitian to make a meal plan that is best for you. ?This information is not intended to replace advice given to you by your health care provider. Make sure you discuss any questions you have with your health  care provider. ?Document Revised: 07/07/2020 Document Reviewed: 07/07/2020 ?Elsevier Patient Education ? [place]. ? ?

## 2022-03-13 NOTE — Progress Notes (Signed)
? ? ?Patient ID: Elaine Marquez, female    DOB: 1971/12/07  MRN: [id number] ? ?CC: Diabetes and Hypertension ? ? ?Subjective: ?Elaine Marquez is a 51 y.o. 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 ?HTN: We stopped Atenolol on last visit and added amlodipine instead to the Cozaar. ?Tolerating Norvasc and Cozaar and taking as prescribed ?Limits salt ?No device to check BP ? ?DM ?Results for orders placed or performed in visit on 03/13/22  ?POCT glucose (manual entry)  ?Result Value Ref Range  ? POC Glucose 306 (A) 70 - 99 mg/dl  ?POCT glycosylated hemoglobin (Hb A1C)  ?Result Value Ref Range  ? Hemoglobin A1C    ? HbA1c POC (<> result, manual entry)    ? HbA1c, POC (prediabetic range)    ? HbA1c, POC (controlled diabetic range) 11.0 (A) 0.0 - 7.0 %  ?A1c increased from last visit. ?Trulicity inc to 1.5 mg Q wk on last visit.  She was continued on metformin and Amaryl.  Consistent with taking it Trulicity.  Overall compliance with taking metformin and Amaryl she states is about 90% just because some days she forgets to take them.  She does have a med box and has her med box with her today. ?Not doing well with eating habits.  Eats out of bordom.  She keeps her grandbaby sometimes and states that she snacks along with him. ?Not getting in much exercise.  Has gym membership for 4 yrs but not using it.  She and her spouse plan to start going ? ?Asthma: No recent flare of asthma.  Reports compliance with Dulera. ? ?Patient is in a little bit of a rush today to get to work. ? ?Patient Active Problem List  ? Diagnosis Date Noted  ? Class 3 severe obesity due to excess calories with serious comorbidity and body mass index (BMI) of 40.0 to 44.9 in adult [facility]) 09/20/2021  ? Spondylosis without myelopathy or radiculopathy, cervical region  02/12/2019  ? Type 2 diabetes mellitus without complication, without long-term current use of insulin ([place]) 05/15/2018  ?  ? ?Current Outpatient Medications on File Prior to Visit  ?Medication Sig Dispense Refill  ? Accu-Chek Softclix Lancets lancets TEST THREE TIMES DAILY AS DIRECTED 100 each 11  ? albuterol (PROAIR HFA) 108 (90 Base) MCG/ACT inhaler Inhale 2 puffs into the lungs every 6 (six) hours as needed for wheezing or shortness of breath. 8.5 g 1  ? albuterol (PROVENTIL) (2.5 MG/3ML) 0.083% nebulizer solution Take 3 mLs (2.5 mg total) by nebulization every 6 (six) hours as needed for wheezing. 75 mL 0  ? amLODipine (NORVASC) 5 MG tablet Take 1 tablet (5 mg total) by mouth daily. For blood pressure 30 tablet 0  ? Blood Glucose Monitoring Suppl (ACCU-CHEK GUIDE) w/Device KIT TEST THREE TIMES DAILY 1 kit 0  ? diclofenac Sodium (VOLTAREN) 1 % GEL Apply 2 g topically 4 (four) times daily. 100 g 0  ? Dulaglutide (TRULICITY) 1.5 QQ/2.2LN SOPN Inject 1.5 mg into the skin once a week. 2 mL 6  ? glucose blood (ACCU-CHEK GUIDE) test strip USE TO check blood sugar three times daily 100 strip 2  ? hydrocortisone 2.5 % cream Apply once daily to affected area x 5 days. 30 g 0  ? Lancets Misc. (ACCU-CHEK FASTCLIX LANCET) KIT Check blood glucose at least 3  times daily. 1 kit 6  ? losartan (COZAAR) 25 MG tablet Take 1 tablet (25 mg total) by mouth daily. 30 tablet 6  ? metFORMIN (GLUCOPHAGE-XR) 500 MG 24 hr tablet TAKE ONE TABLET BY MOUTH EVERY MORNING AND TAKE TWO TABLETS BY MOUTH EVERY IN THE EVENING 90 tablet 6  ? methocarbamol (ROBAXIN) 500 MG tablet Take 1 tablet (500 mg total) by mouth 2 (two) times daily as needed for muscle spasms. 40 tablet 0  ? mometasone-formoterol (DULERA) 100-5 MCG/ACT AERO Inhale 2 puffs into the lungs 2 (two) times daily. 13 g 0  ? traMADol (ULTRAM) 50 MG tablet Take 1 tablet (50 mg total) by mouth daily as needed. 15 tablet 0  ? ?No current facility-administered medications on file prior to  visit.  ? ? ?Allergies  ?Allergen Reactions  ? Ibuprofen Nausea And Vomiting  ? ? ?Social History  ? ?Socioeconomic History  ? Marital status: Married  ?  Spouse name: Not on file  ? Number of children: Not on file  ? Years of education: Not on file  ? Highest education level: Not on file  ?Occupational History  ? Not on file  ?Tobacco Use  ? Smoking status: Never  ? Smokeless tobacco: Never  ?Substance and Sexual Activity  ? Alcohol use: Yes  ?  Comment: occasionally   ? Drug use: No  ? Sexual activity: Not on file  ?Other Topics Concern  ? Not on file  ?Social History Narrative  ? Not on file  ? ?Social Determinants of Health  ? ?Financial Resource Strain: Not on file  ?Food Insecurity: Not on file  ?Transportation Needs: Not on file  ?Physical Activity: Not on file  ?Stress: Not on file  ?Social Connections: Not on file  ?Intimate Partner Violence: Not on file  ? ? ?Family History  ?Problem Relation Age of Onset  ? Hypertension Mother   ? Diabetes Mother   ? Hypertension Father   ? Diabetes Father   ? CVA Brother   ? ? ?Past Surgical History:  ?Procedure Laterality Date  ? TUBAL LIGATION    ? ? ?ROS: ?Review of Systems ?Negative except as stated above ? ?PHYSICAL EXAM: ?BP 126/85   Pulse 72   Resp 16   Wt 271 lb (122.9 kg)   SpO2 97%   BMI 46.52 kg/m?   ?Wt Readings from Last 3 Encounters:  ?03/13/22 271 lb (122.9 kg)  ?09/20/21 273 lb 12.8 oz (124.2 kg)  ?10/11/20 260 lb 9.6 oz (118.2 kg)  ? ? ?Physical Exam ? ?General appearance - alert, well appearing, middle-aged older African-American female and in no distress ?Mental status - normal mood, behavior, speech, dress, motor activity, and thought processes ?Neck - supple, no significant adenopathy ?Chest - clear to auscultation, no wheezes, rales or rhonchi, symmetric air entry ?Heart - normal rate, regular rhythm, normal S1, S2, no murmurs, rubs, clicks or gallops ?Extremities - peripheral pulses normal, no pedal edema, no clubbing or cyanosis ? ? ? ?   Latest Ref Rng & Units 09/20/2021  ?  4:17 PM 12/24/2019  ?  3:15 PM 01/28/2019  ?  4:53 PM  ?CMP  ?Glucose 70 - 99 mg/dL 224   312   250    ?BUN 6 - 24 mg/dL _0 ?Creatinine 0.57 - 1.00 mg/dL 0.66   0.69   0.68    ?Sodium 134 - 144 mmol/L 139   137   139    ?  Potassium 3.5 - 5.2 mmol/L 4.7   4.8   4.7    ?Chloride 96 - 106 mmol/L 100   98   99    ?CO2 20 - 29 mmol/L _0 ?Calcium 8.7 - 10.2 mg/dL 10.0   10.6   10.3    ?Total Protein 6.0 - 8.5 g/dL 7.3   7.4   7.5    ?Total Bilirubin 0.0 - 1.2 mg/dL 0.3   0.3   0.3    ?Alkaline Phos 44 - 121 IU/L 30   33   25    ?AST 0 - 40 IU/L _1 ?ALT 0 - 32 IU/L _2 ? ?Lipid Panel  ?   ?Component Value Date/Time  ? CHOL 120 09/20/2021 1617  ? TRIG 55 09/20/2021 1617  ? HDL 68 09/20/2021 1617  ? CHOLHDL 1.8 09/20/2021 1617  ? [place] 40 09/20/2021 1617  ? ? ?CBC ?   ?Component Value Date/Time  ? WBC 10.3 09/20/2021 1617  ? WBC 9.0 05/15/2018 0843  ? RBC 4.65 09/20/2021 1617  ? RBC 4.62 05/15/2018 0843  ? HGB 13.6 09/20/2021 1617  ? HCT 41.6 09/20/2021 1617  ? PLT 406 09/20/2021 1617  ? MCV 90 09/20/2021 1617  ? MCH 29.2 09/20/2021 1617  ? MCH 29.9 05/15/2018 0843  ? MCHC 32.7 09/20/2021 1617  ? MCHC 33.5 05/15/2018 0843  ? RDW 13.0 09/20/2021 1617  ? LYMPHSABS 3.3 (H) 12/24/2019 1515  ? EOSABS 0.2 12/24/2019 1515  ? BASOSABS 0.1 12/24/2019 1515  ? ? ?ASSESSMENT AND PLAN: ?1. Type 2 diabetes mellitus with morbid obesity ([place]) ?Not at goal.  Dietary indiscretions contributing.  Encourage healthy eating habits and to avoid frequent snacking.  Also discussed the importance of regular exercise in helping to control diabetes.  She plans to start using her gym membership. ?-Recommend adding daily dose of glargine insulin.  Patient initially reluctant but states she is willing to try it for short period of time while she works on improving her eating habits and exercise with the hopes that we would be able to discontinue it in the future.  Went  over with her signs and symptoms of hypoglycemia and how to treat.  Clinical pharmacist met with her today to teach her insulin administration. ?Continue Trulicity and metformin.  Discontinue Amaryl ?- POCT glucose (manua

## 2022-03-24 ENCOUNTER — Other Ambulatory Visit: Payer: Self-pay | Admitting: Internal Medicine

## 2022-03-24 NOTE — Telephone Encounter (Signed)
Requested Prescriptions  ?Pending Prescriptions Disp Refills  ?? ACCU-CHEK GUIDE test strip [Pharmacy Med Name: Accu-Chek Guide test strips] 100 strip 2  ?  Sig: USE TO check blood sugar THREE TIMES DAILY  ?  ? Endocrinology: Diabetes - Testing Supplies Passed - 03/24/2022 11:14 AM  ?  ?  Passed - Valid encounter within last 12 months  ?  Recent Outpatient Visits   ?      ? 1 week ago Type 2 diabetes mellitus with morbid obesity (HCC)  ? [facility] Jonah Blue B, MD  ? 6 months ago Type 2 diabetes mellitus with morbid obesity (HCC)  ? [facility] Marcine Matar, MD  ? 1 year ago Acute neck pain  ? [facility] Marcine Matar, MD  ? 1 year ago Need for vaccination against Streptococcus pneumoniae  ? [facility] Lois Huxley, Cornelius Moras, RPH-CPP  ? 1 year ago Type 2 diabetes mellitus with hyperglycemia, without long-term current use of insulin (HCC)  ? [facility] Marcine Matar, MD  ?  ?  ? ?  ?  ?  ? ? ?

## 2022-04-03 ENCOUNTER — Ambulatory Visit (INDEPENDENT_AMBULATORY_CARE_PROVIDER_SITE_OTHER): Payer: Medicaid Other

## 2022-04-03 ENCOUNTER — Ambulatory Visit: Payer: Medicaid Other | Admitting: Orthopedic Surgery

## 2022-04-03 DIAGNOSIS — M25561 Pain in right knee: Secondary | ICD-10-CM

## 2022-04-03 MED ORDER — CELECOXIB 100 MG PO CAPS: 100.0000 mg | ORAL_CAPSULE | Freq: Two times a day (BID) | ORAL | 0 refills | Status: DC

## 2022-04-05 ENCOUNTER — Encounter: Payer: Self-pay | Admitting: Orthopedic Surgery

## 2022-04-05 ENCOUNTER — Other Ambulatory Visit: Payer: Self-pay | Admitting: Internal Medicine

## 2022-04-05 DIAGNOSIS — I152 Hypertension secondary to endocrine disorders: Secondary | ICD-10-CM

## 2022-04-05 NOTE — Telephone Encounter (Signed)
Requested medication (s) are due for refill today:   Yes ? ?Requested medication (s) are on the active medication list:   Yes ? ?Future visit scheduled:   No  Just seen 3 wks ago ? ? ?Last ordered: 09/20/2021 #30, 6 refills ? ?Returned because Cr and potassium overdue, greater than 180 days per protocol.  ? ?Requested Prescriptions  ?Pending Prescriptions Disp Refills  ? losartan (COZAAR) 25 MG tablet [Pharmacy Med Name: losartan 25 mg tablet] 30 tablet 6  ?  Sig: Take 1 tablet (25 mg total) by mouth daily.  ?  ? Cardiovascular:  Angiotensin Receptor Blockers Failed - 04/05/2022 10:00 AM  ?  ?  Failed - Cr in normal range and within 180 days  ?  Creatinine, Ser  ?Date Value Ref Range Status  ?09/20/2021 0.66 0.57 - 1.00 mg/dL Final  ?  ?  ?  ?  Failed - K in normal range and within 180 days  ?  Potassium  ?Date Value Ref Range Status  ?09/20/2021 4.7 3.5 - 5.2 mmol/L Final  ?  ?  ?  ?  Passed - Patient is not pregnant  ?  ?  Passed - Last BP in normal range  ?  BP Readings from Last 1 Encounters:  ?03/13/22 126/85  ?  ?  ?  ?  Passed - Valid encounter within last 6 months  ?  Recent Outpatient Visits   ? ?      ? 3 weeks ago Type 2 diabetes mellitus with morbid obesity (HCC)  ? [facility] Jonah Blue B, MD  ? 6 months ago Type 2 diabetes mellitus with morbid obesity (HCC)  ? [facility] Marcine Matar, MD  ? 1 year ago Acute neck pain  ? [facility] Marcine Matar, MD  ? 1 year ago Need for vaccination against Streptococcus pneumoniae  ? [facility] Lois Huxley, Cornelius Moras, RPH-CPP  ? 1 year ago Type 2 diabetes mellitus with hyperglycemia, without long-term current use of insulin (HCC)  ? [facility] Marcine Matar, MD  ? ?  ?  ? ? ?  ?  ?  ? ?

## 2022-04-05 NOTE — Progress Notes (Signed)
? ?Office Visit Note ?  ?Patient: Elaine Marquez           ?Date of Birth: 1971/04/19           ?MRN: [id number] ?Visit Date: 04/03/2022 ?Requested by: Marcine Matar, MD ?[address] ?[address] ?[place],  [place] [address] ?PCP: Marcine Matar, MD ? ?Subjective: ?Chief Complaint  ?Patient presents with  ? Right Knee - Pain  ? ? ?HPI: Patient presents with several week history of right knee intense pain.  Reports some locking and popping.  Describes some difficulty with ambulation as well as stiffness in the morning when she wakes up.  Denies any swelling or giving way.  She states she has been limping for about 2 weeks.  No prior surgeries on the right knee.  Tylenol has not been helpful.  Denies any hip or groin or new low back pain.  She cares for her grandson.  Has walking endurance of less than 1 block.  She does have a history of taking Celebrex which has helped her in the past.  Last seen over 3 years ago. ?             ?ROS: All systems reviewed are negative as they relate to the chief complaint within the history of present illness.  Patient denies  fevers or chills. ? ? ?Assessment & Plan: ?Visit Diagnoses:  ?1. Right knee pain, unspecified chronicity   ? ? ?Plan: Impression is right knee pain with arthritis present primarily in the medial compartment.  We talked about another injection today but she wants to hold off on that.  We will consider injection in 3 to 4 weeks if she does not improve on Celebrex.  We will try that 100 mg twice a day for a month and if no better come back in 4 repeat evaluation and consideration for injection.  None loadbearing quad strengthening exercises encouraged.  She may need knee replacement at sometime in the future. ? ?Follow-Up Instructions: Return if symptoms worsen or fail to improve.  ? ?Orders:  ?Orders Placed This Encounter  ?Procedures  ? XR KNEE 3 VIEW RIGHT  ? ?Meds ordered this encounter  ?Medications  ? celecoxib (CELEBREX) 100 MG capsule  ?  Sig:  Take 1 capsule (100 mg total) by mouth 2 (two) times daily.  ?  Dispense:  60 capsule  ?  Refill:  0  ? ? ? ? Procedures: ?No procedures performed ? ? ?Clinical Data: ?No additional findings. ? ?Objective: ?Vital Signs: There were no vitals taken for this visit. ? ?Physical Exam:  ? ?Constitutional: Patient appears well-developed ?HEENT:  ?Head: Normocephalic ?Eyes:EOM are normal ?Neck: Normal range of motion ?Cardiovascular: Normal rate ?Pulmonary/chest: Effort normal ?Neurologic: Patient is alert ?Skin: Skin is warm ?Psychiatric: Patient has normal mood and affect ? ? ?Ortho Exam: Ortho exam demonstrates slight varus alignment.  Pedal pulses palpable.  Range of motion is 0-1 15.  No effusion.  Collateral and cruciate ligaments are stable.  No masses lymphadenopathy or skin changes noted in that right knee region.  Ankle dorsiflexion intact.  No groin pain with internal and external rotation of the right knee.  Extensor mechanism nontender and functional ? ?Specialty Comments:  ?No specialty comments available. ? ?Imaging: ?No results found. ? ? ?PMFS History: ?Patient Active Problem List  ? Diagnosis Date Noted  ? Class 3 severe obesity due to excess calories with serious comorbidity and body mass index (BMI) of 40.0 to 44.9 in  adult [facility]) 09/20/2021  ? Spondylosis without myelopathy or radiculopathy, cervical region 02/12/2019  ? Type 2 diabetes mellitus without complication, without long-term current use of insulin (HCC) 05/15/2018  ? ?Past Medical History:  ?Diagnosis Date  ? Asthma   ? Diabetes mellitus without complication (HCC)   ? Hypertension   ? Peritonsillar abscess   ?  ?Family History  ?Problem Relation Age of Onset  ? Hypertension Mother   ? Diabetes Mother   ? Hypertension Father   ? Diabetes Father   ? CVA Brother   ?  ?Past Surgical History:  ?Procedure Laterality Date  ? TUBAL LIGATION    ? ?Social History  ? ?Occupational History  ? Not on file  ?Tobacco Use  ? Smoking status: Never  ? Smokeless  tobacco: Never  ?Substance and Sexual Activity  ? Alcohol use: Yes  ?  Comment: occasionally   ? Drug use: No  ? Sexual activity: Not on file  ? ? ? ? ? ?

## 2022-04-10 ENCOUNTER — Other Ambulatory Visit: Payer: Self-pay | Admitting: Internal Medicine

## 2022-04-10 ENCOUNTER — Other Ambulatory Visit: Payer: Self-pay | Admitting: Orthopedic Surgery

## 2022-04-10 DIAGNOSIS — E1159 Type 2 diabetes mellitus with other circulatory complications: Secondary | ICD-10-CM

## 2022-04-10 DIAGNOSIS — J4530 Mild persistent asthma, uncomplicated: Secondary | ICD-10-CM

## 2022-04-13 ENCOUNTER — Other Ambulatory Visit: Payer: Self-pay | Admitting: Internal Medicine

## 2022-05-08 ENCOUNTER — Other Ambulatory Visit: Payer: Self-pay | Admitting: Orthopedic Surgery

## 2022-06-02 ENCOUNTER — Other Ambulatory Visit: Payer: Self-pay | Admitting: Internal Medicine

## 2022-06-02 DIAGNOSIS — E1169 Type 2 diabetes mellitus with other specified complication: Secondary | ICD-10-CM

## 2022-06-21 ENCOUNTER — Ambulatory Visit: Payer: Self-pay | Admitting: *Deleted

## 2022-06-21 NOTE — Telephone Encounter (Signed)
Pt was called and informed to reach out to her orthopedic for her back pain.

## 2022-06-21 NOTE — Telephone Encounter (Signed)
Reason for Disposition  [1] SEVERE abdominal pain AND [2] present > 1 hour  Answer Assessment - Initial Assessment Questions 1. ONSET: "When did the pain begin?"      1 week ago 2. LOCATION: "Where does it hurt?" (upper, mid or lower back)     L lower back 3. SEVERITY: "How bad is the pain?"  (e.g., Scale 1-10; mild, moderate, or severe)   - MILD (1-3): doesn't interfere with normal activities    - MODERATE (4-7): interferes with normal activities or awakens from sleep    - SEVERE (8-10): excruciating pain, unable to do any normal activities      severe 4. PATTERN: "Is the pain constant?" (e.g., yes, no; constant, intermittent)      constant 5. RADIATION: "Does the pain shoot into your legs or elsewhere?"     no 6. CAUSE:  "What do you think is causing the back pain?"      Not sure 7. BACK OVERUSE:  "Any recent lifting of heavy objects, strenuous work or exercise?"     no 8. MEDICATIONS: "What have you taken so far for the pain?" (e.g., nothing, acetaminophen, NSAIDS)     Tylenol 9. NEUROLOGIC SYMPTOMS: "Do you have any weakness, numbness, or problems with bowel/bladder control?"     no 10. OTHER SYMPTOMS: "Do you have any other symptoms?" (e.g., fever, abdominal pain, burning with urination, blood in urine)       Constipated, urinary pain 11. PREGNANCY: "Is there any chance you are pregnant?" (e.g., yes, no; LMP)  Protocols used: Back Pain-A-AH

## 2022-06-22 ENCOUNTER — Ambulatory Visit (INDEPENDENT_AMBULATORY_CARE_PROVIDER_SITE_OTHER): Payer: Self-pay

## 2022-06-22 ENCOUNTER — Ambulatory Visit (INDEPENDENT_AMBULATORY_CARE_PROVIDER_SITE_OTHER): Payer: Self-pay | Admitting: Orthopedic Surgery

## 2022-06-22 ENCOUNTER — Encounter: Payer: Self-pay | Admitting: Orthopedic Surgery

## 2022-06-22 DIAGNOSIS — M545 Low back pain, unspecified: Secondary | ICD-10-CM

## 2022-06-22 DIAGNOSIS — G8929 Other chronic pain: Secondary | ICD-10-CM

## 2022-06-22 DIAGNOSIS — M1712 Unilateral primary osteoarthritis, left knee: Secondary | ICD-10-CM

## 2022-06-22 DIAGNOSIS — M542 Cervicalgia: Secondary | ICD-10-CM

## 2022-06-22 MED ORDER — CELECOXIB 100 MG PO CAPS: 100.0000 mg | ORAL_CAPSULE | Freq: Two times a day (BID) | ORAL | 0 refills | Status: DC

## 2022-06-22 MED ORDER — TRAMADOL HCL 50 MG PO TABS: 50.0000 mg | ORAL_TABLET | Freq: Every day | ORAL | 0 refills | Status: DC | PRN

## 2022-06-22 MED ORDER — METHOCARBAMOL 500 MG PO TABS: 500.0000 mg | ORAL_TABLET | Freq: Two times a day (BID) | ORAL | 0 refills | Status: DC | PRN

## 2022-06-22 NOTE — Progress Notes (Signed)
Office Visit Note   Patient: Elaine Marquez           Date of Birth: 09-24-1971           MRN: [id number] Visit Date: 06/22/2022 Requested by: Marcine Matar, MD [address] [place],  [place] [address] PCP: Marcine Matar, MD  Subjective: Chief Complaint  Patient presents with   Lower Back - Pain    HPI: Patient presents for evaluation of 1 week history of left-sided low back and left flank pain.  Reports pain is 9 out of 10.  Pain is off midline on the left side of her lower back region.  Celebrex did help her knee but has not helped her back.  No history of kidney stones.              ROS: All systems reviewed are negative as they relate to the chief complaint within the history of present illness.  Patient denies  fevers or chills.   Assessment & Plan: Visit Diagnoses:  1. Low back pain, unspecified back pain laterality, unspecified chronicity, unspecified whether sciatica present   2. Chronic neck pain   3. Primary osteoarthritis of left knee     Plan: Impression is low back/flank pain.  Plan is tramadol plus refill Celebrex and Robaxin.  I think we should give this a week and if she is no better we could consider further imaging for rather abrupt onset of low back pain.  This could also be kidney stone related.  Encouraged her to follow-up with her primary care physician as well in regards to working that up if this does not help her symptoms.  Follow-Up Instructions: Return if symptoms worsen or fail to improve.   Orders:  Orders Placed This Encounter  Procedures   XR Lumbar Spine 2-3 Views   Meds ordered this encounter  Medications   celecoxib (CELEBREX) 100 MG capsule    Sig: Take 1 capsule (100 mg total) by mouth 2 (two) times daily.    Dispense:  60 capsule    Refill:  0   traMADol (ULTRAM) 50 MG tablet    Sig: Take 1 tablet (50 mg total) by mouth daily as needed.    Dispense:  15 tablet    Refill:  0   methocarbamol (ROBAXIN) 500 MG  tablet    Sig: Take 1 tablet (500 mg total) by mouth 2 (two) times daily as needed for muscle spasms.    Dispense:  40 tablet    Refill:  0      Procedures: No procedures performed   Clinical Data: No additional findings.  Objective: Vital Signs: There were no vitals taken for this visit.  Physical Exam:   Constitutional: Patient appears well-developed HEENT:  Head: Normocephalic Eyes:EOM are normal Neck: Normal range of motion Cardiovascular: Normal rate Pulmonary/chest: Effort normal Neurologic: Patient is alert Skin: Skin is warm Psychiatric: Patient has normal mood and affect   Ortho Exam: Ortho exam demonstrates no nerve root tension signs with palpable pedal pulses in bilateral lower extremities.  5 out of 5 ankle dorsiflexion plantarflexion quad hamstring strength with no paresthesias L1-S1 bilaterally.  Some pain with forward lateral bending and there is some pain to palpation in the left flank region.  No masses skin changes or lymphadenopathy in that back region.  No trochanteric tenderness is present.  Specialty Comments:  No specialty comments available.  Imaging: XR Lumbar Spine 2-3 Views  Result Date: 06/22/2022 AP  lateral radiographs lumbar spine reviewed.  No acute fracture no spondylolisthesis.  Mild to moderate degenerative changes between the vertebral bodies and in the lower facet joints.    PMFS History: Patient Active Problem List   Diagnosis Date Noted   Class 3 severe obesity due to excess calories with serious comorbidity and body mass index (BMI) of 40.0 to 44.9 in adult [facility]) 09/20/2021   Spondylosis without myelopathy or radiculopathy, cervical region 02/12/2019   Type 2 diabetes mellitus without complication, without long-term current use of insulin (HCC) 05/15/2018   Past Medical History:  Diagnosis Date   Asthma    Diabetes mellitus without complication (HCC)    Hypertension    Peritonsillar abscess     Family History  Problem  Relation Age of Onset   Hypertension Mother    Diabetes Mother    Hypertension Father    Diabetes Father    CVA Brother     Past Surgical History:  Procedure Laterality Date   TUBAL LIGATION     Social History   Occupational History   Not on file  Tobacco Use   Smoking status: Never   Smokeless tobacco: Never  Substance and Sexual Activity   Alcohol use: Yes    Comment: occasionally    Drug use: No   Sexual activity: Not on file

## 2022-06-30 ENCOUNTER — Telehealth: Payer: Self-pay | Admitting: Internal Medicine

## 2022-06-30 NOTE — Telephone Encounter (Signed)
Message noted.   Will discuss with pt on next visit.  Last lipid profile was excellent. Lab Results  Component Value Date   CHOL 120 09/20/2021   HDL 68 09/20/2021   LDLCALC 40 09/20/2021   TRIG 55 09/20/2021   CHOLHDL 1.8 09/20/2021

## 2022-06-30 NOTE — Telephone Encounter (Signed)
Clement calling from [employer] is requesting a Statin for the patient to prevent stroke because she is diabetic Best contact: [phone] [facility] - [place], [place] - [address]  [address] [place] [address]  Phone: [phone] Fax: [phone]

## 2022-07-03 ENCOUNTER — Other Ambulatory Visit: Payer: Self-pay | Admitting: Internal Medicine

## 2022-07-03 ENCOUNTER — Telehealth: Payer: Self-pay

## 2022-07-03 DIAGNOSIS — J4530 Mild persistent asthma, uncomplicated: Secondary | ICD-10-CM

## 2022-07-03 MED ORDER — FLUTICASONE-SALMETEROL 115-21 MCG/ACT IN AERO: 2.0000 | INHALATION_SPRAY | Freq: Two times a day (BID) | RESPIRATORY_TRACT | 2 refills | Status: DC

## 2022-07-03 NOTE — Telephone Encounter (Signed)
Rx sent for Advair HFA to patient's pharmacy.

## 2022-07-03 NOTE — Telephone Encounter (Signed)
I received a prior authorization request for [facility].  It looks like insurance prefers Advair HFA.  If appropriate, can this be changed?

## 2022-07-04 NOTE — Telephone Encounter (Signed)
Requested medications are due for refill today.  unsure  Requested medications are on the active medications list.  no  Last refill. 04/10/2022   Future visit scheduled.   no  Notes to clinic.  Medication was discontinued on 07/03/2022.    Requested Prescriptions  Pending Prescriptions Disp Refills   DULERA 100-5 MCG/ACT AERO [Pharmacy Med Name: Dulera 100 mcg-5 mcg/actuation HFA aerosol inhaler] 13 g 2    Sig: INHALE TWO PUFFS INTO THE LUNGS TWICE DAILY     Pulmonology:  Combination Products Passed - 07/03/2022  9:18 AM      Passed - Valid encounter within last 12 months    Recent Outpatient Visits           3 months ago Type 2 diabetes mellitus with morbid obesity (HCC)   [facility] [place], Gavin Pound B, MD   9 months ago Type 2 diabetes mellitus with morbid obesity [facility])   [facility] Marcine Matar, MD   1 year ago Acute neck pain   [facility] Marcine Matar, MD   1 year ago Need for vaccination against Streptococcus pneumoniae   [facility] [place], Cornelius Moras, RPH-CPP   1 year ago Type 2 diabetes mellitus with hyperglycemia, without long-term current use of insulin [facility])   [facility] Marcine Matar, MD

## 2022-07-07 ENCOUNTER — Other Ambulatory Visit: Payer: Self-pay | Admitting: Orthopedic Surgery

## 2022-07-07 DIAGNOSIS — G8929 Other chronic pain: Secondary | ICD-10-CM

## 2022-07-07 DIAGNOSIS — M1712 Unilateral primary osteoarthritis, left knee: Secondary | ICD-10-CM

## 2022-07-18 ENCOUNTER — Encounter: Payer: Self-pay | Admitting: *Deleted

## 2022-07-18 DIAGNOSIS — R7309 Other abnormal glucose: Secondary | ICD-10-CM

## 2022-07-19 LAB — GLUCOSE, POCT (MANUAL RESULT ENTRY): POC Glucose: 371 mg/dl — AB (ref 70–99)

## 2022-07-19 NOTE — Congregational Nurse Program (Signed)
  Dept: [phone]   Congregational Nurse Program Note  Date of Encounter: 07/18/2022  Past Medical History: Past Medical History:  Diagnosis Date   Asthma    Diabetes mellitus without complication (HCC)    Hypertension    Peritonsillar abscess     Encounter Details:  CNP Questionnaire - 07/18/22 1730       Questionnaire   Do you give verbal consent to treat you today? Yes    Location Patient Served  [employer]    Visit Setting Church or Organization    Patient Status Unknown    Engineer, building services or [employer] Referral N/A    Medication N/A    Medical Provider Yes    Screening Referrals N/A    Medical Referral N/A    Medical Appointment Made N/A    Food Have Food Insecurities    Transportation N/A    Housing/Utilities N/A    Interpersonal Safety N/A    Intervention Blood glucose    ED Visit Averted Yes    Life-Saving Intervention Made N/A            Client came into [facility] for dinner and to nurse clinic for CBG check.  Fingerstick glucose 371.  Client is not surprised and tells me that she is a diabetic.  This CN will follow up as requested by client.  Roderic Palau, RN, MSN, CNP [phone] Office [phone] Cell

## 2022-07-28 ENCOUNTER — Telehealth: Payer: Self-pay

## 2022-07-28 ENCOUNTER — Other Ambulatory Visit: Payer: Self-pay

## 2022-07-28 NOTE — Telephone Encounter (Signed)
Trulicity PA has been approved until 07/28/2023.  Pharmacy has been notified.

## 2022-08-07 ENCOUNTER — Other Ambulatory Visit: Payer: Self-pay | Admitting: Orthopedic Surgery

## 2022-08-17 ENCOUNTER — Other Ambulatory Visit: Payer: Self-pay | Admitting: Internal Medicine

## 2022-08-17 DIAGNOSIS — E1159 Type 2 diabetes mellitus with other circulatory complications: Secondary | ICD-10-CM

## 2022-09-21 ENCOUNTER — Other Ambulatory Visit: Payer: Self-pay | Admitting: Internal Medicine

## 2022-09-21 DIAGNOSIS — I152 Hypertension secondary to endocrine disorders: Secondary | ICD-10-CM

## 2022-09-21 NOTE — Telephone Encounter (Signed)
Requested medication (s) are due for refill today - yes  Requested medication (s) are on the active medication list -yes  Future visit scheduled -no  Last refill: 08/17/22 #30  Notes to clinic: Fails lab protocol- over 1 year, courtesy RF has already been given- request sent for review  Requested Prescriptions  Pending Prescriptions Disp Refills   losartan (COZAAR) 25 MG tablet [Pharmacy Med Name: losartan 25 mg tablet] 30 tablet 0    Sig: TAKE ONE TABLET BY MOUTH EVERY DAY     Cardiovascular:  Angiotensin Receptor Blockers Failed - 09/21/2022  9:38 AM      Failed - Cr in normal range and within 180 days    Creatinine, Ser  Date Value Ref Range Status  09/20/2021 0.66 0.57 - 1.00 mg/dL Final         Failed - K in normal range and within 180 days    Potassium  Date Value Ref Range Status  09/20/2021 4.7 3.5 - 5.2 mmol/L Final         Failed - Valid encounter within last 6 months    Recent Outpatient Visits           6 months ago Type 2 diabetes mellitus with morbid obesity (HCC)   [facility] Marcine Matar, MD   1 year ago Type 2 diabetes mellitus with morbid obesity (HCC)   [facility] Marcine Matar, MD   1 year ago Acute neck pain   [facility] Marcine Matar, MD   2 years ago Need for vaccination against Streptococcus pneumoniae   [facility] Drucilla Chalet, RPH-CPP   2 years ago Type 2 diabetes mellitus with hyperglycemia, without long-term current use of insulin (HCC)   [facility] Marcine Matar, MD              Passed - Patient is not pregnant      Passed - Last BP in normal range    BP Readings from Last 1 Encounters:  03/13/22 126/85            Requested Prescriptions  Pending Prescriptions Disp Refills   losartan (COZAAR) 25 MG tablet [Pharmacy Med Name: losartan 25 mg  tablet] 30 tablet 0    Sig: TAKE ONE TABLET BY MOUTH EVERY DAY     Cardiovascular:  Angiotensin Receptor Blockers Failed - 09/21/2022  9:38 AM      Failed - Cr in normal range and within 180 days    Creatinine, Ser  Date Value Ref Range Status  09/20/2021 0.66 0.57 - 1.00 mg/dL Final         Failed - K in normal range and within 180 days    Potassium  Date Value Ref Range Status  09/20/2021 4.7 3.5 - 5.2 mmol/L Final         Failed - Valid encounter within last 6 months    Recent Outpatient Visits           6 months ago Type 2 diabetes mellitus with morbid obesity (HCC)   [facility] Marcine Matar, MD   1 year ago Type 2 diabetes mellitus with morbid obesity [facility])   [facility] Marcine Matar, MD   1 year ago Acute neck pain   [facility] [place] B,  MD   2 years ago Need for vaccination against Streptococcus pneumoniae   [place], RPH-CPP   2 years ago Type 2 diabetes mellitus with hyperglycemia, without long-term current use of insulin [facility])   [place] Ladell Pier, MD              Passed - Patient is not pregnant      Passed - Last BP in normal range    BP Readings from Last 1 Encounters:  03/13/22 126/85

## 2022-09-28 ENCOUNTER — Other Ambulatory Visit: Payer: Self-pay

## 2022-10-11 ENCOUNTER — Other Ambulatory Visit: Payer: Self-pay | Admitting: Internal Medicine

## 2022-10-11 DIAGNOSIS — M1712 Unilateral primary osteoarthritis, left knee: Secondary | ICD-10-CM

## 2022-10-11 DIAGNOSIS — G8929 Other chronic pain: Secondary | ICD-10-CM

## 2022-10-11 DIAGNOSIS — E1169 Type 2 diabetes mellitus with other specified complication: Secondary | ICD-10-CM

## 2022-10-11 NOTE — Telephone Encounter (Signed)
Medication Refill - Medication: traMADol (ULTRAM) 50 MG tablet Insulin Glargine Solostar (LANTUS) 100 UNIT/ML Solostar Pen  Has the patient contacted their pharmacy? Yes.   Pt says she did  Preferred Pharmacy (with phone number or street name): [place], [place] Has the patient been seen for an appointment in the last year OR does the patient have an upcoming appointment? Yes.    Pt states she is completely out of her insulin and has been trying for days to get it.

## 2022-10-12 ENCOUNTER — Other Ambulatory Visit: Payer: Self-pay | Admitting: Orthopedic Surgery

## 2022-10-12 ENCOUNTER — Other Ambulatory Visit: Payer: Self-pay | Admitting: Internal Medicine

## 2022-10-12 DIAGNOSIS — M1712 Unilateral primary osteoarthritis, left knee: Secondary | ICD-10-CM

## 2022-10-12 DIAGNOSIS — G8929 Other chronic pain: Secondary | ICD-10-CM

## 2022-10-12 DIAGNOSIS — J4530 Mild persistent asthma, uncomplicated: Secondary | ICD-10-CM

## 2022-10-12 MED ORDER — INSULIN GLARGINE SOLOSTAR 100 UNIT/ML ~~LOC~~ SOPN: 12.0000 [IU] | PEN_INJECTOR | Freq: Every evening | SUBCUTANEOUS | 0 refills | Status: DC

## 2022-10-12 NOTE — Telephone Encounter (Signed)
Patient states, is out of these med, I let her know the status. Tramadol and insulin

## 2022-10-12 NOTE — Telephone Encounter (Signed)
Requested medication (s) are due for refill today:   Provider to review  Requested medication (s) are on the active medication list:   Yes for both  Future visit scheduled:   Yes 12/14/2022 with Dr. Laural Benes   Last ordered: Glargine Insulin 03/13/2022 15 ml, 3 refills;   Tramadol 06/22/2022 #15, 0 refills  Returned because of non delegated refill, Tramadol.   Provider to review insulin for refill prior to upcoming appt.  Pt. Is out of these 2 medications.   Requested Prescriptions  Pending Prescriptions Disp Refills   traMADol (ULTRAM) 50 MG tablet 15 tablet 0    Sig: Take 1 tablet (50 mg total) by mouth daily as needed.     Not Delegated - Analgesics:  Opioid Agonists Failed - 10/12/2022 12:37 PM      Failed - This refill cannot be delegated      Failed - Urine Drug Screen completed in last 360 days      Failed - Valid encounter within last 3 months    Recent Outpatient Visits           7 months ago Type 2 diabetes mellitus with morbid obesity (HCC)   [facility] Jonah Blue B, MD   1 year ago Type 2 diabetes mellitus with morbid obesity [facility])   [facility] Marcine Matar, MD   2 years ago Acute neck pain   [facility] Marcine Matar, MD   2 years ago Need for vaccination against Streptococcus pneumoniae   [facility] [place], Cornelius Moras, RPH-CPP   2 years ago Type 2 diabetes mellitus with hyperglycemia, without long-term current use of insulin [facility])   [facility] Marcine Matar, MD       Future Appointments             In 2 months Marcine Matar, MD [facility]             Insulin Glargine Solostar (LANTUS) 100 UNIT/ML Solostar Pen 15 mL 3    Sig: Inject 12 Units into the skin at bedtime.     Endocrinology:  Diabetes - Insulins Failed - 10/12/2022 12:37 PM       Failed - HBA1C is between 0 and 7.9 and within 180 days    HbA1c, POC (controlled diabetic range)  Date Value Ref Range Status  03/13/2022 11.0 (A) 0.0 - 7.0 % Final         Failed - Valid encounter within last 6 months    Recent Outpatient Visits           7 months ago Type 2 diabetes mellitus with morbid obesity (HCC)   [facility] Jonah Blue B, MD   1 year ago Type 2 diabetes mellitus with morbid obesity [facility])   [facility] Marcine Matar, MD   2 years ago Acute neck pain   [facility] Marcine Matar, MD   2 years ago Need for vaccination against Streptococcus pneumoniae   [facility] [place], Cornelius Moras, RPH-CPP   2 years ago Type 2 diabetes mellitus with hyperglycemia, without long-term current use of insulin [facility])   [place] [facility] Marcine Matar, MD       Future Appointments  In 2 months Ladell Pier, MD [place]

## 2022-10-13 ENCOUNTER — Other Ambulatory Visit: Payer: Self-pay | Admitting: Internal Medicine

## 2022-10-13 DIAGNOSIS — M1712 Unilateral primary osteoarthritis, left knee: Secondary | ICD-10-CM

## 2022-10-13 DIAGNOSIS — G8929 Other chronic pain: Secondary | ICD-10-CM

## 2022-10-13 NOTE — Telephone Encounter (Signed)
Requested medication (s) are due for refill today: yes  Requested medication (s) are on the active medication list: yes  Last refill:  06/22/22 #15 tablets  Future visit scheduled: yes  Notes to clinic:  last prescribed by Dr Marcene Duos- previously by PCP   Requested Prescriptions  Pending Prescriptions Disp Refills   traMADol (ULTRAM) 50 MG tablet [Pharmacy Med Name: tramadol 50 mg tablet] 15 tablet 0    Sig: TAKE ONE TABLET BY MOUTH EVERY DAY AS NEEDED     Not Delegated - Analgesics:  Opioid Agonists Failed - 10/13/2022  1:45 PM      Failed - This refill cannot be delegated      Failed - Urine Drug Screen completed in last 360 days      Failed - Valid encounter within last 3 months    Recent Outpatient Visits           7 months ago Type 2 diabetes mellitus with morbid obesity ([place])   [place], Deborah B, MD   1 year ago Type 2 diabetes mellitus with morbid obesity ([place])   [place], Deborah B, MD   2 years ago Acute neck pain   [place], Deborah B, MD   2 years ago Need for vaccination against Streptococcus pneumoniae   [place], Jarome Matin, RPH-CPP   2 years ago Type 2 diabetes mellitus with hyperglycemia, without long-term current use of insulin [facility])   [place], MD       Future Appointments             In 2 months Wynetta Emery Dalbert Batman, MD [address]

## 2022-10-14 ENCOUNTER — Other Ambulatory Visit: Payer: Self-pay | Admitting: Internal Medicine

## 2022-10-14 DIAGNOSIS — E119 Type 2 diabetes mellitus without complications: Secondary | ICD-10-CM

## 2022-10-16 ENCOUNTER — Other Ambulatory Visit: Payer: Self-pay | Admitting: Pharmacist

## 2022-10-16 ENCOUNTER — Other Ambulatory Visit: Payer: Self-pay

## 2022-10-16 MED ORDER — BASAGLAR KWIKPEN 100 UNIT/ML ~~LOC~~ SOPN: 12.0000 [IU] | PEN_INJECTOR | Freq: Every day | SUBCUTANEOUS | 0 refills | Status: DC

## 2022-10-16 MED ORDER — FLUTICASONE-SALMETEROL 250-50 MCG/ACT IN AEPB: INHALATION_SPRAY | RESPIRATORY_TRACT | 2 refills | Status: DC

## 2022-10-19 ENCOUNTER — Other Ambulatory Visit: Payer: Self-pay | Admitting: Internal Medicine

## 2022-10-19 NOTE — Telephone Encounter (Signed)
Copied from [place] [id number]. Topic: General - Other >> Oct 19, 2022 10:51 AM Everette C wrote: Reason for CRM: The patient has called to follow up on their previous request for contact with a member of clinical staff regarding their prescription   The patient has been without their metFORMIN (GLUCOPHAGE-XR) 500 MG 24 hr tablet [[id number]] for roughly three days   The patient was experiencing no signs of discomfort at the time of call with agent   Please contact the patient further when possible

## 2022-10-19 NOTE — Telephone Encounter (Signed)
Copied from [place] [id number]. Topic: General - Other >> Oct 19, 2022 10:52 AM Everette C wrote: Reason for CRM: The patient has called to follow up on their previous request for contact with a member of clinical staff regarding their prescription   The patient has been without their metFORMIN (GLUCOPHAGE-XR) 500 MG 24 hr tablet [[id number]] for roughly three days   The patient was experiencing no signs of discomfort at the time of call with agent   Please contact the patient further when possible

## 2022-10-23 ENCOUNTER — Other Ambulatory Visit: Payer: Self-pay | Admitting: Internal Medicine

## 2022-10-23 DIAGNOSIS — I152 Hypertension secondary to endocrine disorders: Secondary | ICD-10-CM

## 2022-10-24 NOTE — Telephone Encounter (Signed)
Requested medication (s) are due for refill today: yes  Requested medication (s) are on the active medication list: yes Last refill:  09/19/22  Future visit scheduled: yes  Notes to clinic:  Unable to refill per protocol, last refill by another provider.      Requested Prescriptions  Pending Prescriptions Disp Refills   celecoxib (CELEBREX) 100 MG capsule [Pharmacy Med Name: celecoxib 100 mg capsule] 180 capsule 0    Sig: TAKE ONE CAPSULE BY MOUTH TWICE DAILY     Analgesics:  COX2 Inhibitors Failed - 10/23/2022  1:28 PM      Failed - Manual Review: Labs are only required if the patient has taken medication for more than 8 weeks.      Failed - HGB in normal range and within 360 days    Hemoglobin  Date Value Ref Range Status  09/20/2021 13.6 11.1 - 15.9 g/dL Final         Failed - Cr in normal range and within 360 days    Creatinine, Ser  Date Value Ref Range Status  09/20/2021 0.66 0.57 - 1.00 mg/dL Final         Failed - HCT in normal range and within 360 days    Hematocrit  Date Value Ref Range Status  09/20/2021 41.6 34.0 - 46.6 % Final         Failed - AST in normal range and within 360 days    AST  Date Value Ref Range Status  09/20/2021 22 0 - 40 IU/L Final         Failed - ALT in normal range and within 360 days    ALT  Date Value Ref Range Status  09/20/2021 27 0 - 32 IU/L Final         Failed - eGFR is 30 or above and within 360 days    GFR calc Af Amer  Date Value Ref Range Status  12/24/2019 118 >59 mL/min/1.73 Final   GFR calc non Af Amer  Date Value Ref Range Status  12/24/2019 103 >59 mL/min/1.73 Final   eGFR  Date Value Ref Range Status  09/20/2021 107 >59 mL/min/1.73 Final         Passed - Patient is not pregnant      Passed - Valid encounter within last 12 months    Recent Outpatient Visits           7 months ago Type 2 diabetes mellitus with morbid obesity ([place])   [place], Deborah B, MD    1 year ago Type 2 diabetes mellitus with morbid obesity ([place])   [place], Deborah B, MD   2 years ago Acute neck pain   [place], Deborah B, MD   2 years ago Need for vaccination against Streptococcus pneumoniae   [place], Jarome Matin, RPH-CPP   2 years ago Type 2 diabetes mellitus with hyperglycemia, without long-term current use of insulin [facility])   [place], MD       Future Appointments             In 1 month Wynetta Emery Dalbert Batman, MD [place]            Signed Prescriptions Disp Refills   amLODipine (NORVASC) 5 MG tablet 90 tablet 0    Sig:  TAKE ONE TABLET BY MOUTH EVERY DAY     Cardiovascular: Calcium Channel Blockers 2 Failed - 10/23/2022  1:28 PM      Failed - Valid encounter within last 6 months    Recent Outpatient Visits           7 months ago Type 2 diabetes mellitus with morbid obesity ([place])   [place] Karle Plumber B, MD   1 year ago Type 2 diabetes mellitus with morbid obesity [facility])   [place], MD   2 years ago Acute neck pain   [place], Deborah B, MD   2 years ago Need for vaccination against Streptococcus pneumoniae   [place], Stephen L, RPH-CPP   2 years ago Type 2 diabetes mellitus with hyperglycemia, without long-term current use of insulin [facility])   [place], MD       Future Appointments             In 1 month Ladell Pier, MD [place] BP in normal range    BP Readings from Last 1 Encounters:  03/13/22 126/85         Passed - Last Heart Rate in normal  range    Pulse Readings from Last 1 Encounters:  03/13/22 72

## 2022-10-24 NOTE — Telephone Encounter (Signed)
Requested Prescriptions  Pending Prescriptions Disp Refills   amLODipine (NORVASC) 5 MG tablet [Pharmacy Med Name: amlodipine 5 mg tablet] 90 tablet 0    Sig: TAKE ONE TABLET BY MOUTH EVERY DAY     Cardiovascular: Calcium Channel Blockers 2 Failed - 10/23/2022  1:28 PM      Failed - Valid encounter within last 6 months    Recent Outpatient Visits           7 months ago Type 2 diabetes mellitus with morbid obesity ([place])   [place] Karle Plumber B, MD   1 year ago Type 2 diabetes mellitus with morbid obesity ([place])   [place], Deborah B, MD   2 years ago Acute neck pain   [place], Deborah B, MD   2 years ago Need for vaccination against Streptococcus pneumoniae   [place], Jarome Matin, RPH-CPP   2 years ago Type 2 diabetes mellitus with hyperglycemia, without long-term current use of insulin [facility])   [place], MD       Future Appointments             In 1 month Ladell Pier, MD [place] BP in normal range    BP Readings from Last 1 Encounters:  03/13/22 126/85         Passed - Last Heart Rate in normal range    Pulse Readings from Last 1 Encounters:  03/13/22 72          celecoxib (CELEBREX) 100 MG capsule [Pharmacy Med Name: celecoxib 100 mg capsule] 180 capsule 0    Sig: TAKE ONE CAPSULE BY MOUTH TWICE DAILY     Analgesics:  COX2 Inhibitors Failed - 10/23/2022  1:28 PM      Failed - Manual Review: Labs are only required if the patient has taken medication for more than 8 weeks.      Failed - HGB in normal range and within 360 days    Hemoglobin  Date Value Ref Range Status  09/20/2021 13.6 11.1 - 15.9 g/dL Final         Failed - Cr in normal range and within 360 days    Creatinine, Ser   Date Value Ref Range Status  09/20/2021 0.66 0.57 - 1.00 mg/dL Final         Failed - HCT in normal range and within 360 days    Hematocrit  Date Value Ref Range Status  09/20/2021 41.6 34.0 - 46.6 % Final         Failed - AST in normal range and within 360 days    AST  Date Value Ref Range Status  09/20/2021 22 0 - 40 IU/L Final         Failed - ALT in normal range and within 360 days    ALT  Date Value Ref Range Status  09/20/2021 27 0 - 32 IU/L Final         Failed - eGFR is 30 or above and within 360 days    GFR calc Af Amer  Date Value Ref Range Status  12/24/2019 118 >59 mL/min/1.73 Final   GFR calc non Af Amer  Date Value Ref Range Status  12/24/2019 103 >59 mL/min/1.73  Final   eGFR  Date Value Ref Range Status  09/20/2021 107 >59 mL/min/1.73 Final         Passed - Patient is not pregnant      Passed - Valid encounter within last 12 months    Recent Outpatient Visits           7 months ago Type 2 diabetes mellitus with morbid obesity ([place])   [place] Karle Plumber B, MD   1 year ago Type 2 diabetes mellitus with morbid obesity [facility])   [place], MD   2 years ago Acute neck pain   [place], Deborah B, MD   2 years ago Need for vaccination against Streptococcus pneumoniae   [place], Stephen L, RPH-CPP   2 years ago Type 2 diabetes mellitus with hyperglycemia, without long-term current use of insulin [facility])   [place], MD       Future Appointments             In 1 month Wynetta Emery Dalbert Batman, MD [place]

## 2022-10-26 ENCOUNTER — Other Ambulatory Visit: Payer: Self-pay | Admitting: Orthopedic Surgery

## 2022-11-15 ENCOUNTER — Other Ambulatory Visit: Payer: Self-pay | Admitting: Family Medicine

## 2022-11-22 ENCOUNTER — Other Ambulatory Visit: Payer: Self-pay | Admitting: Internal Medicine

## 2022-11-22 DIAGNOSIS — E1159 Type 2 diabetes mellitus with other circulatory complications: Secondary | ICD-10-CM

## 2022-11-24 ENCOUNTER — Other Ambulatory Visit: Payer: Self-pay | Admitting: Family Medicine

## 2022-11-24 ENCOUNTER — Other Ambulatory Visit: Payer: Self-pay | Admitting: Internal Medicine

## 2022-11-24 DIAGNOSIS — J4530 Mild persistent asthma, uncomplicated: Secondary | ICD-10-CM

## 2022-12-06 ENCOUNTER — Other Ambulatory Visit: Payer: Self-pay | Admitting: Surgical

## 2022-12-12 ENCOUNTER — Other Ambulatory Visit: Payer: Self-pay | Admitting: Family Medicine

## 2022-12-14 ENCOUNTER — Encounter: Payer: Self-pay | Admitting: Internal Medicine

## 2022-12-14 ENCOUNTER — Ambulatory Visit: Payer: Commercial Managed Care - HMO | Attending: Internal Medicine | Admitting: Internal Medicine

## 2022-12-14 DIAGNOSIS — Z23 Encounter for immunization: Secondary | ICD-10-CM

## 2022-12-14 DIAGNOSIS — E1159 Type 2 diabetes mellitus with other circulatory complications: Secondary | ICD-10-CM | POA: Diagnosis not present

## 2022-12-14 DIAGNOSIS — I152 Hypertension secondary to endocrine disorders: Secondary | ICD-10-CM | POA: Diagnosis not present

## 2022-12-14 DIAGNOSIS — E1169 Type 2 diabetes mellitus with other specified complication: Secondary | ICD-10-CM

## 2022-12-14 DIAGNOSIS — Z6841 Body Mass Index (BMI) 40.0 and over, adult: Secondary | ICD-10-CM

## 2022-12-14 DIAGNOSIS — J4530 Mild persistent asthma, uncomplicated: Secondary | ICD-10-CM | POA: Diagnosis not present

## 2022-12-14 DIAGNOSIS — Z1211 Encounter for screening for malignant neoplasm of colon: Secondary | ICD-10-CM

## 2022-12-14 LAB — POCT GLYCOSYLATED HEMOGLOBIN (HGB A1C): HbA1c, POC (controlled diabetic range): 12 % — AB (ref 0.0–7.0)

## 2022-12-14 LAB — GLUCOSE, POCT (MANUAL RESULT ENTRY): POC Glucose: 362 mg/dl — AB (ref 70–99)

## 2022-12-14 MED ORDER — DULERA 100-5 MCG/ACT IN AERO: 2.0000 | INHALATION_SPRAY | Freq: Two times a day (BID) | RESPIRATORY_TRACT | 6 refills | Status: DC

## 2022-12-14 MED ORDER — LOSARTAN POTASSIUM 25 MG PO TABS: 25.0000 mg | ORAL_TABLET | Freq: Every day | ORAL | 1 refills | Status: DC

## 2022-12-14 MED ORDER — TRULICITY 1.5 MG/0.5ML ~~LOC~~ SOAJ
1.5000 mg | SUBCUTANEOUS | 6 refills | Status: DC
  Filled 2023-03-01: qty 2, 28d supply, fill #0

## 2022-12-14 MED ORDER — DICLOFENAC SODIUM 1 % EX GEL: 2.0000 g | Freq: Four times a day (QID) | CUTANEOUS | 1 refills | Status: DC

## 2022-12-14 MED ORDER — BASAGLAR KWIKPEN 100 UNIT/ML ~~LOC~~ SOPN: 15.0000 [IU] | PEN_INJECTOR | Freq: Every day | SUBCUTANEOUS | 6 refills | Status: DC

## 2022-12-14 MED ORDER — METFORMIN HCL ER 500 MG PO TB24: ORAL_TABLET | ORAL | 6 refills | Status: DC

## 2022-12-14 MED ORDER — AMLODIPINE BESYLATE 5 MG PO TABS: 5.0000 mg | ORAL_TABLET | Freq: Every day | ORAL | 1 refills | Status: DC

## 2022-12-14 NOTE — Progress Notes (Signed)
Patient ID: Elaine Marquez, female    DOB: 08-19-1971  MRN: [id number]  CC: Diabetes   Subjective: Elaine Marquez is a 51 y.o. female who presents for chronic ds management.  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 -Eating Habits: Reports that her eating habits have not been as good.  Eating more breads and Mac and cheese and drinking juice Exercise: not getting in much exercise.  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  Neck hurts all the time.  Requests refill on Voltaren gel Meds:  Taking Trulicity 1.5 mg/week consistently.  Taking metformin 1000 mg in the morning and 500 mg in the evening consistently.  However she has not been taking glargine insulin 12 units consistently  HTN:  taking Norvasc and Losartan daily Not checking BP Limits salt in foods.  Does most of her cooking.  No chest pain.  No lower extremity edema.  Asthma:  Asthma under go control.  Uses Albuterol inhaler about once a wk. Advair is on med list for maintenance inhaler.  However patient states she also has Dulera.  She thinks it was switched to [facility] when her insurance changed.  She has both Dulera and Advair at home but has been using just the [place].  She feels that this works better for her than Advair.  Using only the Dulera BID  HM: Due for flu shot, eye exam, colon cancer screen.  Declines shingles vaccine Patient Active Problem List   Diagnosis Date Noted   Class 3 severe  obesity due to excess calories with serious comorbidity and body mass index (BMI) of 40.0 to 44.9 in adult [facility]) 09/20/2021   Spondylosis without myelopathy or radiculopathy, cervical region 02/12/2019   Type 2 diabetes mellitus without complication, without long-term current use of insulin ([employer]) 05/15/2018     Current Outpatient Medications on File Prior to Visit  Medication Sig Dispense Refill   ACCU-CHEK GUIDE test strip USE TO check blood sugar THREE TIMES DAILY 100 strip 11   Accu-Chek Softclix Lancets lancets USE TO CHECK BLOOD SUGAR THREE TIMES DAILY AS DIRECTED 100 each 0   albuterol (PROVENTIL) (2.5 MG/3ML) 0.083% nebulizer solution Take 3 mLs (2.5 mg total) by nebulization every 6 (six) hours as needed for wheezing. 75 mL 0   albuterol (VENTOLIN HFA) 108 (90 Base) MCG/ACT inhaler Inhale 2 puffs into the lungs every 6 (six) hours as needed for wheezing or shortness of breath. 6.7 g 0   Blood Glucose Monitoring Suppl (ACCU-CHEK GUIDE) w/Device KIT TEST THREE TIMES DAILY 1 kit 0   celecoxib (CELEBREX) 100 MG capsule TAKE ONE CAPSULE BY MOUTH TWICE DAILY 60 capsule 0   hydrocortisone 2.5 % cream Apply once daily to affected area x 5 days. 30 g 0   Insulin Pen Needle (PEN NEEDLES) 31G X 8 MM MISC UAD 100 each 6  Lancets Misc. (ACCU-CHEK FASTCLIX LANCET) KIT Check blood glucose at least 3 times daily. 1 kit 6   methocarbamol (ROBAXIN) 500 MG tablet Take 1 tablet (500 mg total) by mouth 2 (two) times daily as needed for muscle spasms. 40 tablet 0   traMADol (ULTRAM) 50 MG tablet TAKE ONE TABLET BY MOUTH EVERY DAY AS NEEDED 15 tablet 0   No current facility-administered medications on file prior to visit.    Allergies  Allergen Reactions   Ibuprofen Nausea And Vomiting    Social History   Socioeconomic History   Marital status: Married    Spouse name: Not on file   Number of children: Not on file   Years of education: Not on file   Highest education level: Not on file   Occupational History   Not on file  Tobacco Use   Smoking status: Never   Smokeless tobacco: Never  Substance and Sexual Activity   Alcohol use: Yes    Comment: occasionally    Drug use: No   Sexual activity: Not on file  Other Topics Concern   Not on file  Social History Narrative   Not on file   Social Determinants of Health   Financial Resource Strain: Not on file  Food Insecurity: Not on file  Transportation Needs: Not on file  Physical Activity: Not on file  Stress: Not on file  Social Connections: Not on file  Intimate Partner Violence: Not on file    Family History  Problem Relation Age of Onset   Hypertension Mother    Diabetes Mother    Hypertension Father    Diabetes Father    CVA Brother     Past Surgical History:  Procedure Laterality Date   TUBAL LIGATION      ROS: Review of Systems Negative except as stated above  PHYSICAL EXAM: BP 120/78   Pulse 98   Temp 98.9 F (37.2 C)   Ht _0  (1.626 m)   Wt 259 lb 6.4 oz (117.7 kg)   SpO2 98%   BMI 44.53 kg/m   Physical Exam  General appearance - alert, well appearing, middle-aged African-American female and in no distress Mental status - normal mood, behavior, speech, dress, motor activity, and thought processes Neck - supple, no significant adenopathy. Chest - clear to auscultation, no wheezes, rales or rhonchi, symmetric air entry Heart - normal rate, regular rhythm, normal S1, S2, no murmurs, rubs, clicks or gallops Extremities - peripheral pulses normal, no pedal edema, no clubbing or cyanosis MSK: She has some mild tenderness on palpation over the lower cervical spine. Diabetic Foot Exam - Simple   Simple Foot Form Diabetic Foot exam was performed with the following findings: Yes 12/14/2022  6:04 PM  Visual Inspection See comments: Yes Sensation Testing Intact to touch and monofilament testing bilaterally: Yes Pulse Check Posterior Tibialis and Dorsalis pulse intact bilaterally:  Yes Comments Toenails are overgrown.         Latest Ref Rng & Units 09/20/2021    4:17 PM 12/24/2019    3:15 PM 01/28/2019    4:53 PM  CMP  Glucose 70 - 99 mg/dL 224  312  250   BUN 6 - 24 mg/dL _1 Creatinine 0.57 - 1.00 mg/dL 0.66  0.69  0.68   Sodium 134 - 144 mmol/L 139  137  139   Potassium 3.5 - 5.2 mmol/L 4.7  4.8  4.7   Chloride 96 - 106 mmol/L  100  98  99   CO2 20 - 29 mmol/L _0 Calcium 8.7 - 10.2 mg/dL 10.0  10.6  10.3   Total Protein 6.0 - 8.5 g/dL 7.3  7.4  7.5   Total Bilirubin 0.0 - 1.2 mg/dL 0.3  0.3  0.3   Alkaline Phos 44 - 121 IU/L 30  33  25   AST 0 - 40 IU/L _1 ALT 0 - 32 IU/L _2 Lipid Panel     Component Value Date/Time   CHOL 120 09/20/2021 1617   TRIG 55 09/20/2021 1617   HDL 68 09/20/2021 1617   CHOLHDL 1.8 09/20/2021 1617   LDLCALC 40 09/20/2021 1617    CBC    Component Value Date/Time   WBC 10.3 09/20/2021 1617   WBC 9.0 05/15/2018 0843   RBC 4.65 09/20/2021 1617   RBC 4.62 05/15/2018 0843   HGB 13.6 09/20/2021 1617   HCT 41.6 09/20/2021 1617   PLT 406 09/20/2021 1617   MCV 90 09/20/2021 1617   MCH 29.2 09/20/2021 1617   MCH 29.9 05/15/2018 0843   MCHC 32.7 09/20/2021 1617   MCHC 33.5 05/15/2018 0843   RDW 13.0 09/20/2021 1617   LYMPHSABS 3.3 (H) 12/24/2019 1515   EOSABS 0.2 12/24/2019 1515   BASOSABS 0.1 12/24/2019 1515    ASSESSMENT AND PLAN:  1. Type 2 diabetes mellitus with morbid obesity (HCC) Not at goal. Discussed what changes she feels needs to be made to get her diabetes under better control.  Patient thinks diabetes would be better if she takes her insulin consistently every day and plans to do so.  She will continue the Trulicity and metformin as well.  Also discussed and encourage healthy eating habits.  Encouraged her to move more - POCT glucose (manual entry) - POCT A1C - CBC; Future - Comprehensive metabolic panel; Future - Lipid panel; Future - Microalbumin / creatinine  urine ratio - Dulaglutide (TRULICITY) 1.5 RC/3.8FM SOPN; Inject 1.5 mg into the skin once a week.  Dispense: 2 mL; Refill: 6 - Insulin Glargine (BASAGLAR KWIKPEN) 100 UNIT/ML; Inject 15 Units into the skin daily.  Dispense: 15 mL; Refill: 6 - metFORMIN (GLUCOPHAGE-XR) 500 MG 24 hr tablet; TAKE ONE TABLET BY MOUTH EVERY MORNING AND TAKE TWO TABLETS BY MOUTH EVERY IN THE EVENING  Dispense: 90 tablet; Refill: 6 - Ambulatory referral to Ophthalmology  2. Hypertension associated with diabetes ([place]) At goal.  Continue current medications - amLODipine (NORVASC) 5 MG tablet; Take 1 tablet (5 mg total) by mouth daily.  Dispense: 90 tablet; Refill: 1 - losartan (COZAAR) 25 MG tablet; Take 1 tablet (25 mg total) by mouth daily.  Dispense: 90 tablet; Refill: 1  3. Mild persistent asthma without complication Control.  Continue Dulera - DULERA 100-5 MCG/ACT AERO; Inhale 2 puffs into the lungs 2 (two) times daily.  Dispense: 1 each; Refill: 6  4. Need for influenza vaccination - Flu Vaccine QUAD 57moIM (Fluarix, Fluzone & Alfiuria Quad PF)  5. Screening for colon cancer - Cologuard    Patient was given the opportunity to ask questions.  Patient verbalized understanding of the plan and was able to repeat key elements of the plan.   This documentation was completed using DRadio producer  Any transcriptional errors are unintentional.  Orders Placed This Encounter  Procedures   Flu Vaccine QUAD 611moM (Fluarix, Fluzone & Alfiuria Quad PF)  Cologuard   CBC   Comprehensive metabolic panel   Lipid panel   Microalbumin / creatinine urine ratio   Ambulatory referral to Ophthalmology   POCT glucose (manual entry)   POCT A1C     Requested Prescriptions   Signed Prescriptions Disp Refills   amLODipine (NORVASC) 5 MG tablet 90 tablet 1    Sig: Take 1 tablet (5 mg total) by mouth daily.   diclofenac Sodium (VOLTAREN) 1 % GEL 100 g 1    Sig: Apply 2 g topically 4 (four) times  daily.   Dulaglutide (TRULICITY) 1.5 AE/8.2BR SOPN 2 mL 6    Sig: Inject 1.5 mg into the skin once a week.   DULERA 100-5 MCG/ACT AERO 1 each 6    Sig: Inhale 2 puffs into the lungs 2 (two) times daily.   Insulin Glargine (BASAGLAR KWIKPEN) 100 UNIT/ML 15 mL 6    Sig: Inject 15 Units into the skin daily.   losartan (COZAAR) 25 MG tablet 90 tablet 1    Sig: Take 1 tablet (25 mg total) by mouth daily.   metFORMIN (GLUCOPHAGE-XR) 500 MG 24 hr tablet 90 tablet 6    Sig: TAKE ONE TABLET BY MOUTH EVERY MORNING AND TAKE TWO TABLETS BY MOUTH EVERY IN THE EVENING    Return in about 6 weeks (around 01/25/2023) for PAP.  Karle Plumber, MD, FACP

## 2022-12-15 ENCOUNTER — Other Ambulatory Visit: Payer: Self-pay

## 2022-12-15 ENCOUNTER — Other Ambulatory Visit: Payer: Self-pay | Admitting: Surgical

## 2022-12-15 LAB — MICROALBUMIN / CREATININE URINE RATIO
Creatinine, Urine: 35.6 mg/dL
Microalb/Creat Ratio: 11 mg/g creat (ref 0–29)
Microalbumin, Urine: 4 ug/mL

## 2022-12-29 ENCOUNTER — Other Ambulatory Visit: Payer: Self-pay | Admitting: Internal Medicine

## 2022-12-29 NOTE — Telephone Encounter (Signed)
Requested medication (s) are due for refill today:   No  Requested medication (s) are on the active medication list:   Yes  Future visit scheduled:   Yes   Last ordered: 12/14/2022 100 g, 1 refill  Returned because labs are overdue per protocol.   Requested Prescriptions  Pending Prescriptions Disp Refills   diclofenac Sodium (VOLTAREN) 1 % GEL [Pharmacy Med Name: diclofenac 1 % topical gel] 100 g 1    Sig: Apply 2 g topically 4 (four) times daily.     Analgesics:  Topicals Failed - 12/29/2022 10:52 AM      Failed - Manual Review: Labs are only required if the patient has taken medication for more than 8 weeks.      Failed - PLT in normal range and within 360 days    Platelets  Date Value Ref Range Status  09/20/2021 406 150 - 450 x10E3/uL Final         Failed - HGB in normal range and within 360 days    Hemoglobin  Date Value Ref Range Status  09/20/2021 13.6 11.1 - 15.9 g/dL Final         Failed - HCT in normal range and within 360 days    Hematocrit  Date Value Ref Range Status  09/20/2021 41.6 34.0 - 46.6 % Final         Failed - Cr in normal range and within 360 days    Creatinine, Ser  Date Value Ref Range Status  09/20/2021 0.66 0.57 - 1.00 mg/dL Final         Failed - eGFR is 30 or above and within 360 days    GFR calc Af Amer  Date Value Ref Range Status  12/24/2019 118 >59 mL/min/1.73 Final   GFR calc non Af Amer  Date Value Ref Range Status  12/24/2019 103 >59 mL/min/1.73 Final   eGFR  Date Value Ref Range Status  09/20/2021 107 >59 mL/min/1.73 Final         Passed - Patient is not pregnant      Passed - Valid encounter within last 12 months    Recent Outpatient Visits           2 weeks ago Type 2 diabetes mellitus with morbid obesity ([place])   [place] Karle Plumber B, MD   9 months ago Type 2 diabetes mellitus with morbid obesity ([place])   [place], Deborah B, MD    1 year ago Type 2 diabetes mellitus with morbid obesity [facility])   [place], MD   2 years ago Acute neck pain   [place], Deborah B, MD   2 years ago Need for vaccination against Streptococcus pneumoniae   [place], RPH-CPP       Future Appointments             In 3 weeks Ladell Pier, MD [place]

## 2023-01-01 ENCOUNTER — Telehealth: Payer: Self-pay | Admitting: Internal Medicine

## 2023-01-01 ENCOUNTER — Other Ambulatory Visit: Payer: Self-pay | Admitting: Internal Medicine

## 2023-01-01 DIAGNOSIS — G8929 Other chronic pain: Secondary | ICD-10-CM

## 2023-01-01 DIAGNOSIS — M1712 Unilateral primary osteoarthritis, left knee: Secondary | ICD-10-CM

## 2023-01-01 NOTE — Telephone Encounter (Signed)
Medication Refill - Medication: traMADol (ULTRAM) 50 MG tablet   Has the patient contacted their pharmacy? Yes.    Patient referred to PCP Patient is out of medication and is in pain.    Preferred Pharmacy (with phone number or street name): [place], [place] Phone: [phone]  Fax: [phone]   Has the patient been seen for an appointment in the last year OR does the patient have an upcoming appointment? Yes.    Agent: Please be advised that RX refills may take up to 3 business days. We ask that you follow-up with your pharmacy.

## 2023-01-01 NOTE — Telephone Encounter (Signed)
Already requested by pharmacy.

## 2023-01-02 NOTE — Telephone Encounter (Signed)
Requested medication (s) are due for refill today: yes  Requested medication (s) are on the active medication list: yes  Last refill:  10/14/22  Future visit scheduled: yes  Notes to clinic:  Unable to refill per protocol, cannot delegate.      Requested Prescriptions  Pending Prescriptions Disp Refills   traMADol (ULTRAM) 50 MG tablet [Pharmacy Med Name: tramadol 50 mg tablet] 15 tablet 0    Sig: TAKE ONE TABLET BY MOUTH EVERY DAY AS NEEDED     Not Delegated - Analgesics:  Opioid Agonists Failed - 01/01/2023 12:34 PM      Failed - This refill cannot be delegated      Failed - Urine Drug Screen completed in last 360 days      Passed - Valid encounter within last 3 months    Recent Outpatient Visits           2 weeks ago Type 2 diabetes mellitus with morbid obesity ([place])   [place] Karle Plumber B, MD   9 months ago Type 2 diabetes mellitus with morbid obesity ([place])   [place], Deborah B, MD   1 year ago Type 2 diabetes mellitus with morbid obesity [facility])   [place], MD   2 years ago Acute neck pain   [place], Deborah B, MD   2 years ago Need for vaccination against Streptococcus pneumoniae   [place], RPH-CPP       Future Appointments             In 3 weeks Ladell Pier, MD [place]

## 2023-01-04 ENCOUNTER — Other Ambulatory Visit: Payer: Self-pay

## 2023-01-15 ENCOUNTER — Other Ambulatory Visit: Payer: Self-pay | Admitting: Family Medicine

## 2023-01-16 NOTE — Telephone Encounter (Signed)
Refused Advair refill request.   Refused on 12/14/2022.

## 2023-01-19 ENCOUNTER — Other Ambulatory Visit: Payer: Self-pay | Admitting: Surgical

## 2023-01-25 ENCOUNTER — Ambulatory Visit: Payer: Commercial Managed Care - HMO | Admitting: Internal Medicine

## 2023-02-07 ENCOUNTER — Other Ambulatory Visit: Payer: Self-pay

## 2023-02-07 ENCOUNTER — Telehealth: Payer: Self-pay | Admitting: Pharmacist

## 2023-02-07 NOTE — Telephone Encounter (Signed)
Call placed to patient to schedule her with me for diabetes management. We scheduled her for an appt with me in March.   Of note, patient takes Trulicity and has been without the medication for several weeks. She cancelled [employer] 01/29/23 and now has a new [place] [employer] plan. Call placed to patient's pharmacy. [employer] will not cover rxns for a certain period of time after termination of a previous insurance plan, usually 30 days. Found that she can call [employer] to request that this be expedited. Call placed to patient and informed her of this. She was thankful for my call.   Benard Halsted, PharmD, [place], [place] [phone]

## 2023-02-09 ENCOUNTER — Other Ambulatory Visit: Payer: Self-pay | Admitting: Internal Medicine

## 2023-02-09 DIAGNOSIS — G8929 Other chronic pain: Secondary | ICD-10-CM

## 2023-02-09 DIAGNOSIS — M1712 Unilateral primary osteoarthritis, left knee: Secondary | ICD-10-CM

## 2023-02-09 NOTE — Telephone Encounter (Signed)
Medication Refill - Medication: traMADol (ULTRAM) 50 MG tablet   Has the patient contacted their pharmacy? Yes.     Preferred Pharmacy (with phone number or street name):  [place], [place] Phone: [phone]  Fax: [phone]     Has the patient been seen for an appointment in the last year OR does the patient have an upcoming appointment? Yes.     The patient is out of her medicine completely and thought the pharmacy had already sent in a request. Please assist as soon as possible

## 2023-02-09 NOTE — Telephone Encounter (Signed)
Requested medication (s) are due for refill today: yes  Requested medication (s) are on the active medication list: yes  Last refill:  01/02/23 #15  Future visit scheduled: yes  Notes to clinic:  Med not delegated to NT to RF   Requested Prescriptions  Pending Prescriptions Disp Refills   traMADol (ULTRAM) 50 MG tablet 15 tablet 0    Sig: Take 1 tablet (50 mg total) by mouth daily as needed.     Not Delegated - Analgesics:  Opioid Agonists Failed - 02/09/2023 12:42 PM      Failed - This refill cannot be delegated      Failed - Urine Drug Screen completed in last 360 days      Passed - Valid encounter within last 3 months    Recent Outpatient Visits           1 month ago Type 2 diabetes mellitus with morbid obesity ([place])   [place] Karle Plumber B, MD   11 months ago Type 2 diabetes mellitus with morbid obesity [facility])   [place] Karle Plumber B, MD   1 year ago Type 2 diabetes mellitus with morbid obesity [facility])   [place] Ladell Pier, MD   2 years ago Acute neck pain   [place], MD   2 years ago Need for vaccination against Streptococcus pneumoniae   [place], Stephen L, RPH-CPP       Future Appointments             In 1 month [place], Jarome Matin, [place]   In 2 months Wynetta Emery, Dalbert Batman, MD [place]

## 2023-02-10 MED ORDER — TRAMADOL HCL 50 MG PO TABS: 50.0000 mg | ORAL_TABLET | Freq: Every day | ORAL | 0 refills | Status: DC | PRN

## 2023-03-01 ENCOUNTER — Other Ambulatory Visit: Payer: Self-pay

## 2023-03-02 ENCOUNTER — Other Ambulatory Visit: Payer: Self-pay | Admitting: Internal Medicine

## 2023-03-02 ENCOUNTER — Other Ambulatory Visit: Payer: Self-pay

## 2023-03-02 NOTE — Telephone Encounter (Signed)
Requested by interface surescripts. Last refill 03/02/23 doc. No refill needed at this time.  Requested Prescriptions  Refused Prescriptions Disp Refills   diclofenac Sodium (VOLTAREN) 1 % GEL [Pharmacy Med Name: diclofenac 1 % topical gel] 100 g 1    Sig: APPLY TWO grams topiclly FOUR TIMES DAILY     Analgesics:  Topicals Failed - 03/02/2023  2:07 PM      Failed - Manual Review: Labs are only required if the patient has taken medication for more than 8 weeks.      Failed - PLT in normal range and within 360 days    Platelets  Date Value Ref Range Status  09/20/2021 406 150 - 450 x10E3/uL Final         Failed - HGB in normal range and within 360 days    Hemoglobin  Date Value Ref Range Status  09/20/2021 13.6 11.1 - 15.9 g/dL Final         Failed - HCT in normal range and within 360 days    Hematocrit  Date Value Ref Range Status  09/20/2021 41.6 34.0 - 46.6 % Final         Failed - Cr in normal range and within 360 days    Creatinine, Ser  Date Value Ref Range Status  09/20/2021 0.66 0.57 - 1.00 mg/dL Final         Failed - eGFR is 30 or above and within 360 days    GFR calc Af Amer  Date Value Ref Range Status  12/24/2019 118 >59 mL/min/1.73 Final   GFR calc non Af Amer  Date Value Ref Range Status  12/24/2019 103 >59 mL/min/1.73 Final   eGFR  Date Value Ref Range Status  09/20/2021 107 >59 mL/min/1.73 Final         Passed - Patient is not pregnant      Passed - Valid encounter within last 12 months    Recent Outpatient Visits           2 months ago Type 2 diabetes mellitus with morbid obesity ([place])   [place] Karle Plumber B, MD   11 months ago Type 2 diabetes mellitus with morbid obesity [facility])   [place] Karle Plumber B, MD   1 year ago Type 2 diabetes mellitus with morbid obesity [facility])   [place] Ladell Pier, MD   2 years ago  Acute neck pain   [place] Ladell Pier, MD   2 years ago Need for vaccination against Streptococcus pneumoniae   [place], Stephen L, RPH-CPP       Future Appointments             In 1 week Daisy Blossom, Jarome Matin, [place]   In 2 months Wynetta Emery, Dalbert Batman, MD [place]

## 2023-03-14 NOTE — Progress Notes (Deleted)
S:    PCP: Dr. Wynetta Emery  52 y.o. female who presents for diabetes evaluation, education, and management.  PMH is significant for T2DM, obesity, asthma, and sponylosis.  Patient was referred and last seen by Primary Care Provider, Dr. Wynetta Emery, on 12/14/2022.   At last visit, A1c was up at 12%, previously 11%. She reported nonadherence to [employer]  Today, patient arrives in *** good spirits and presents without *** any assistance. ***  Patient reports Diabetes was diagnosed in ***.   Family/Social History:  -Fhx: -Tobacco: -Alcohol:   Current diabetes medications include: Trulicity 1.5 mg once weekly, metformin 500 mg QAM and 1000 mg QPM, Basaglar 15 units once daily   Patient reports adherence to taking all medications as prescribed.  *** Patient denies adherence with medications, reports missing *** medications *** times per week, on average.  Insurance coverage: [place] Medicaid  Patient {Actions; denies-reports:120008} hypoglycemic events.  Reported home fasting blood sugars: ***  Reported 2 hour post-meal/random blood sugars: ***.  Patient {Actions; denies-reports:120008} nocturia (nighttime urination).  Patient {Actions; denies-reports:120008} neuropathy (nerve pain). Patient {Actions; denies-reports:120008} visual changes. Patient {Actions; denies-reports:120008} self foot exams.   Patient reported dietary habits: Eats *** meals/day Breakfast: *** Lunch: *** Dinner: *** Snacks: *** Drinks: ***  Patient-reported exercise habits: ***   O:   ROS  Physical Exam  7 day average blood glucose: ***  *** CGM Download:  % Time CGM is active: ***% Average Glucose: *** mg/dL Glucose Management Indicator: ***  Glucose Variability: *** (goal <36%) Time in Goal:  - Time in range 70-180: ***% - Time above range: ***% - Time below range: ***% Observed patterns:   Lab Results  Component Value Date   HGBA1C 12.0 (A) 12/14/2022   There were no vitals filed for  this visit.  Lipid Panel     Component Value Date/Time   CHOL 120 09/20/2021 1617   TRIG 55 09/20/2021 1617   HDL 68 09/20/2021 1617   CHOLHDL 1.8 09/20/2021 1617   LDLCALC 40 09/20/2021 1617    Clinical Atherosclerotic Cardiovascular Disease (ASCVD): No  The ASCVD Risk score (Arnett DK, et al., 2019) failed to calculate for the following reasons:   The valid total cholesterol range is 130 to 320 mg/dL   Patient is participating in a Managed Medicaid Plan:  Yes   A/P: Diabetes longstanding *** currently ***. Patient is *** able to verbalize appropriate hypoglycemia management plan. Medication adherence appears ***. Control is suboptimal due to ***. -{Meds adjust:18428} basal insulin *** Lantus/Basaglar/Semglee (insulin glargine) *** Tresiba (insulin degludec) from *** units to *** units daily in the morning. Patient will continue to titrate 1 unit every *** days if fasting blood sugar > 100mg /dl until fasting blood sugars reach goal or next visit.  -{Meds adjust:18428} rapid insulin *** Novolog (insulin aspart) *** Humalog (insulin lispro) from *** to ***.  -{Meds XX123456 GLP-1 *** Trulicity (dulaglutide) *** Ozempic (semaglutide) *** Mounjaro (tirzepatide) from *** mg to *** mg .  -{Meds adjust:18428} SGLT2-I *** Farxiga (dapagliflozin) *** Jardiance (empagliflozin) 10 mg. Counseled on sick day rules. -{Meds adjust:18428} metformin ***.  -Patient educated on purpose, proper use, and potential adverse effects of ***.  -Extensively discussed pathophysiology of diabetes, recommended lifestyle interventions, dietary effects on blood sugar control.  -Counseled on s/sx of and management of hypoglycemia.   ASCVD risk - primary ***secondary prevention in patient with diabetes. Last LDL is *** not at goal of <70 *** mg/dL. ASCVD risk factors include *** and 10-year ASCVD  risk score of ***. {Desc; low/moderate/high:110033} intensity statin indicated.  -{Meds adjust:18428} ***statin *** mg.    Hypertension longstanding *** currently ***. Blood pressure goal of <130/80 *** mmHg. Medication adherence ***. Blood pressure control is suboptimal due to ***. -{Meds adjust:18428} *** mg.  Written patient instructions provided. Patient verbalized understanding of treatment plan.  Total time in face to face counseling *** minutes.    Follow-up:  Pharmacist ***. PCP clinic visit in 05/08/2023  Maryan Puls, PharmD PGY-1 [facility] Pharmacy Resident

## 2023-03-15 ENCOUNTER — Ambulatory Visit: Payer: Commercial Managed Care - HMO | Admitting: Pharmacist

## 2023-03-20 ENCOUNTER — Other Ambulatory Visit: Payer: Self-pay | Admitting: Surgical

## 2023-03-20 ENCOUNTER — Other Ambulatory Visit: Payer: Self-pay | Admitting: Internal Medicine

## 2023-03-20 NOTE — Telephone Encounter (Signed)
Requested by interface surescripts. Medication discontinued 10/16/22.  Requested Prescriptions  Refused Prescriptions Disp Refills   ADVAIR HFA 115-21 MCG/ACT inhaler [Pharmacy Med Name: Advair HFA 115 mcg-21 mcg/actuation aerosol inhaler] 12 g 2    Sig: Inhale 2 puffs into the lungs 2 (two) times daily.     Pulmonology:  Combination Products Passed - 03/20/2023  5:02 PM      Passed - Valid encounter within last 12 months    Recent Outpatient Visits           3 months ago Type 2 diabetes mellitus with morbid obesity ([place])   [place] Karle Plumber B, MD   1 year ago Type 2 diabetes mellitus with morbid obesity [facility])   [place] Karle Plumber B, MD   1 year ago Type 2 diabetes mellitus with morbid obesity [facility])   [place] Ladell Pier, MD   2 years ago Acute neck pain   [place], MD   2 years ago Need for vaccination against Streptococcus pneumoniae   [place], RPH-CPP       Future Appointments             In 1 month Wynetta Emery, Dalbert Batman, MD [place]

## 2023-03-22 ENCOUNTER — Other Ambulatory Visit: Payer: Self-pay | Admitting: Surgical

## 2023-03-29 IMAGING — MG MM DIGITAL SCREENING BILAT W/ TOMO AND CAD
6 of 10 series · 6 of 30 positions shown · non-contrast
Comparison: None.

CLINICAL DATA: Screening.

EXAM:
DIGITAL SCREENING BILATERAL MAMMOGRAM WITH TOMOSYNTHESIS AND CAD
TECHNIQUE: Bilateral screening digital craniocaudal and mediolateral oblique
mammograms were obtained. Bilateral screening digital breast
tomosynthesis was performed. The images were evaluated with
computer-aided detection.

[L MLO synth-2D (1 of 2)]
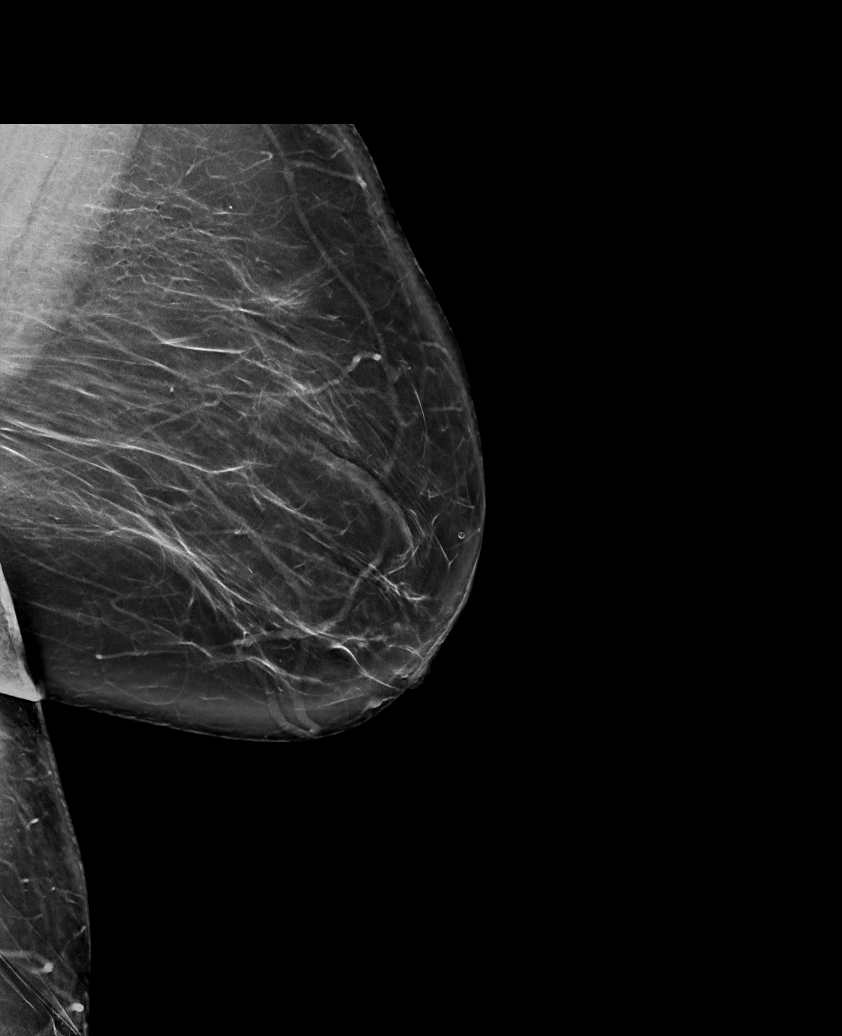

[L MLO synth-2D (2 of 2)]
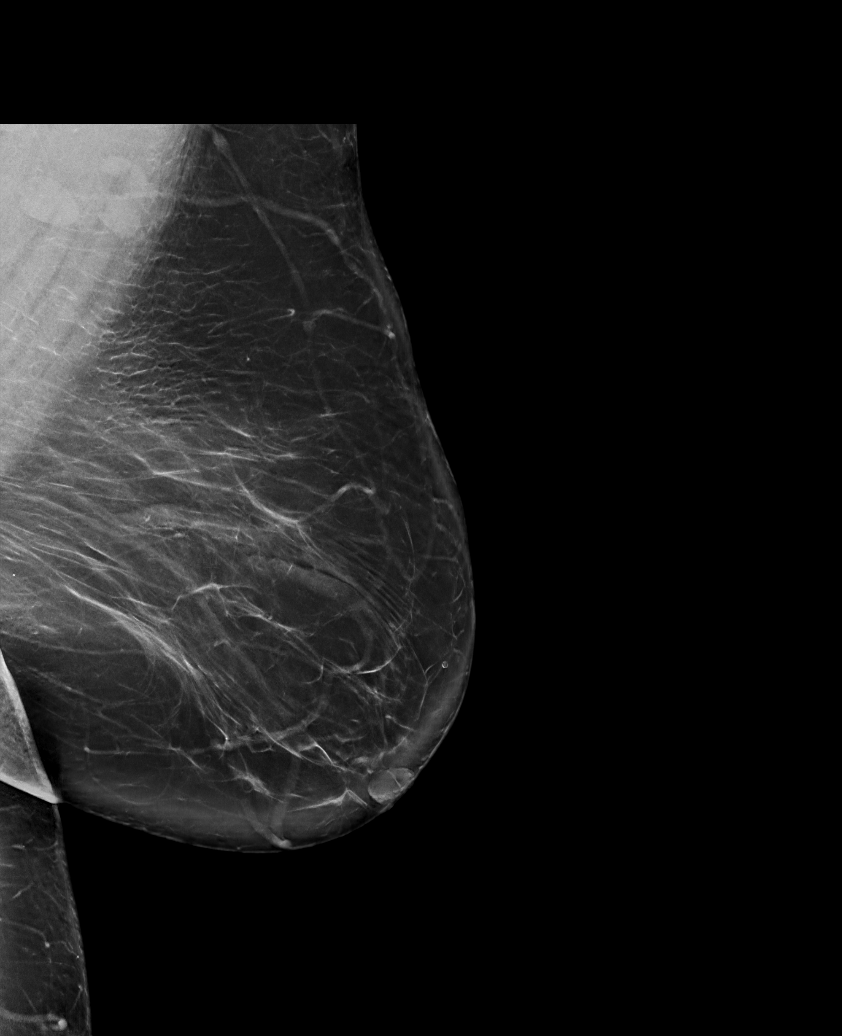

[R CC synth-2D]
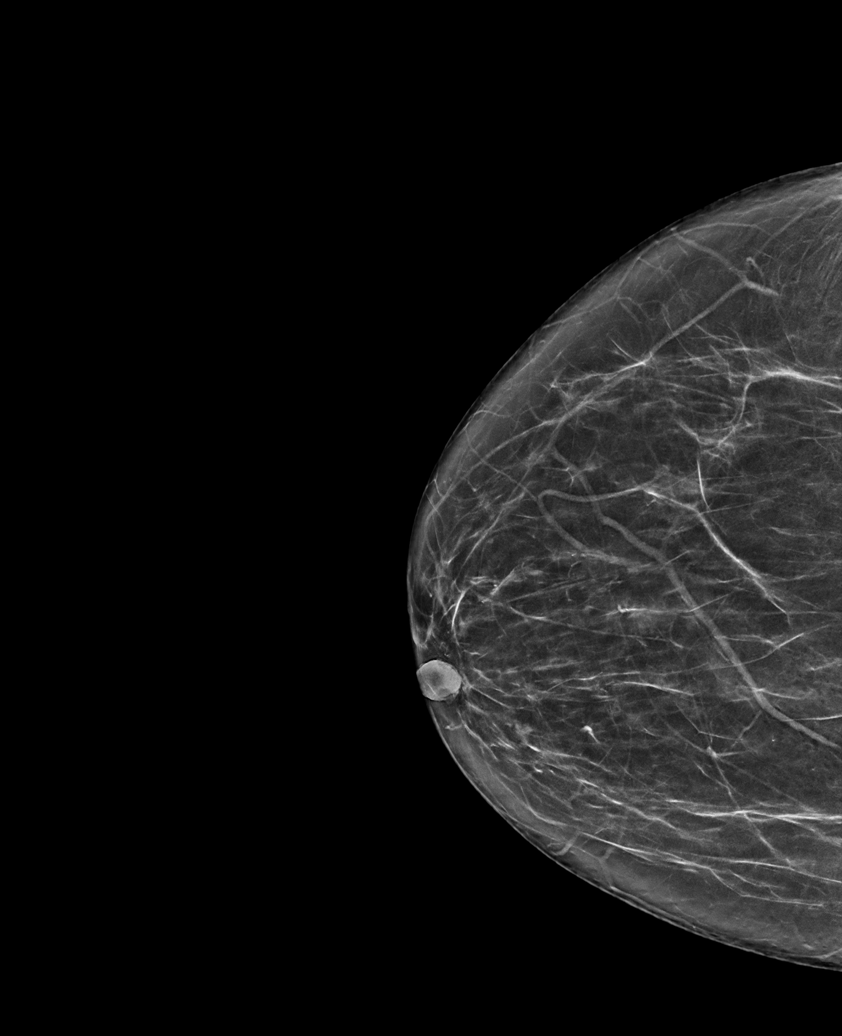

[R MLO synth-2D]
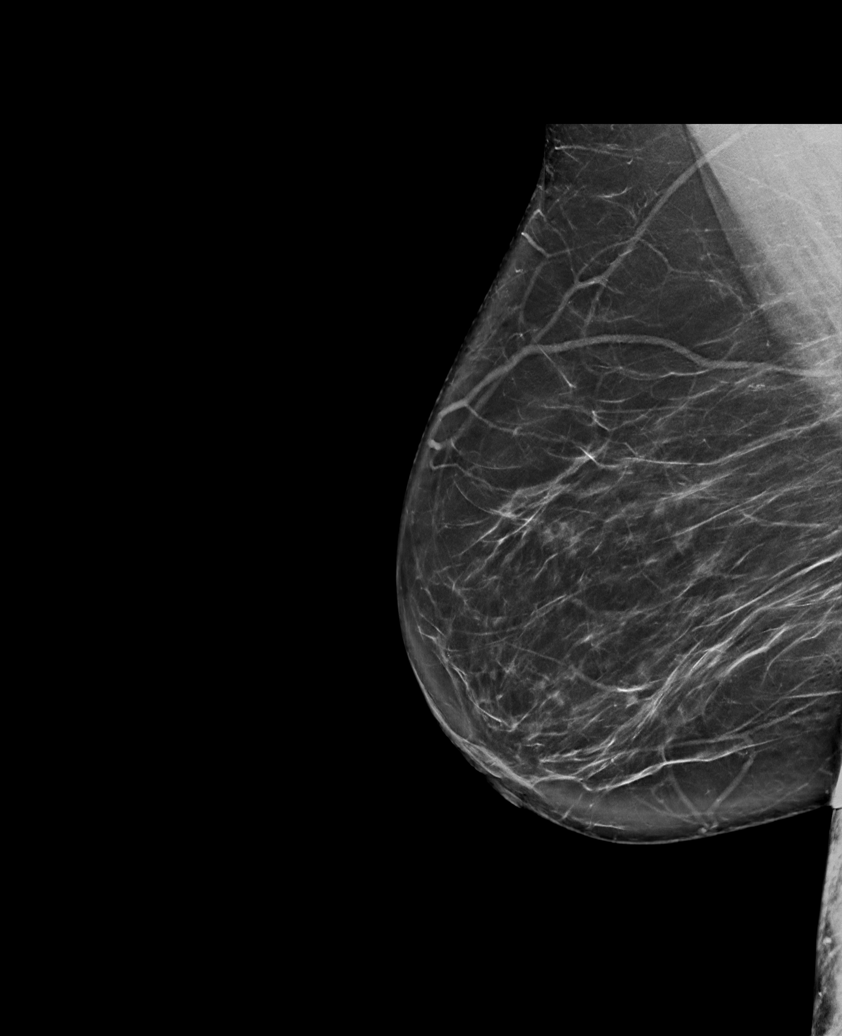

[L CC synth-2D]
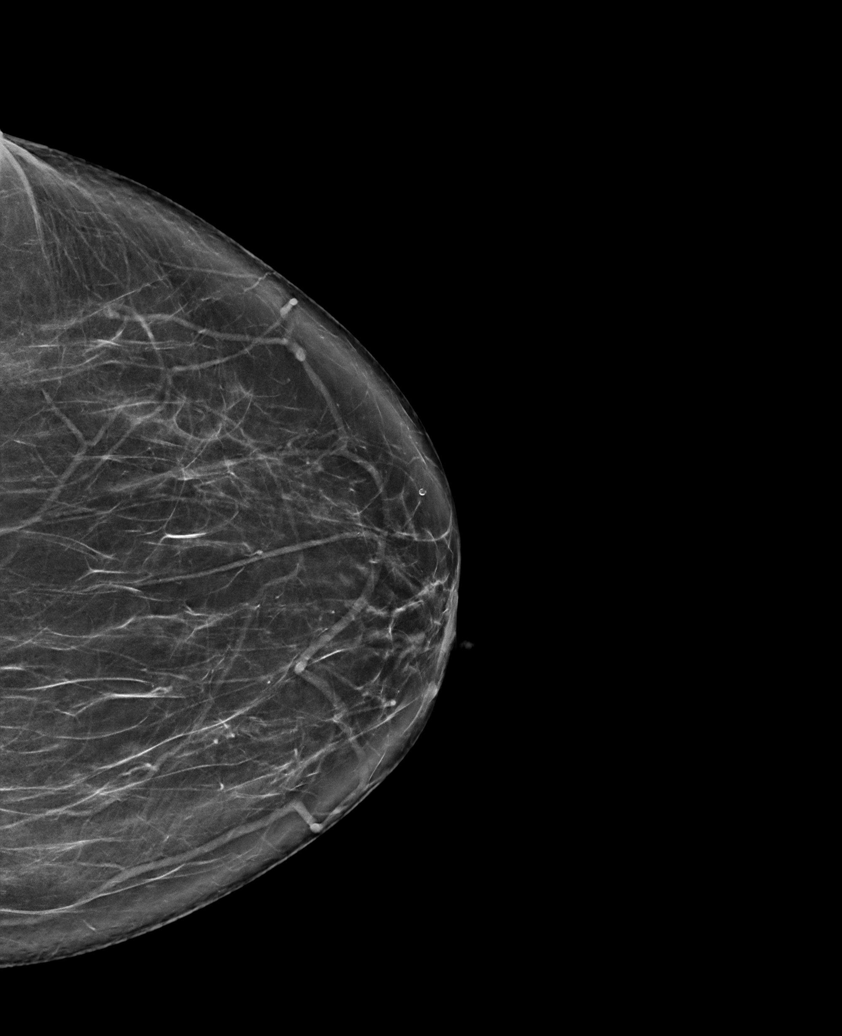

[L MLO tomo · tomo slice 51/100.0]
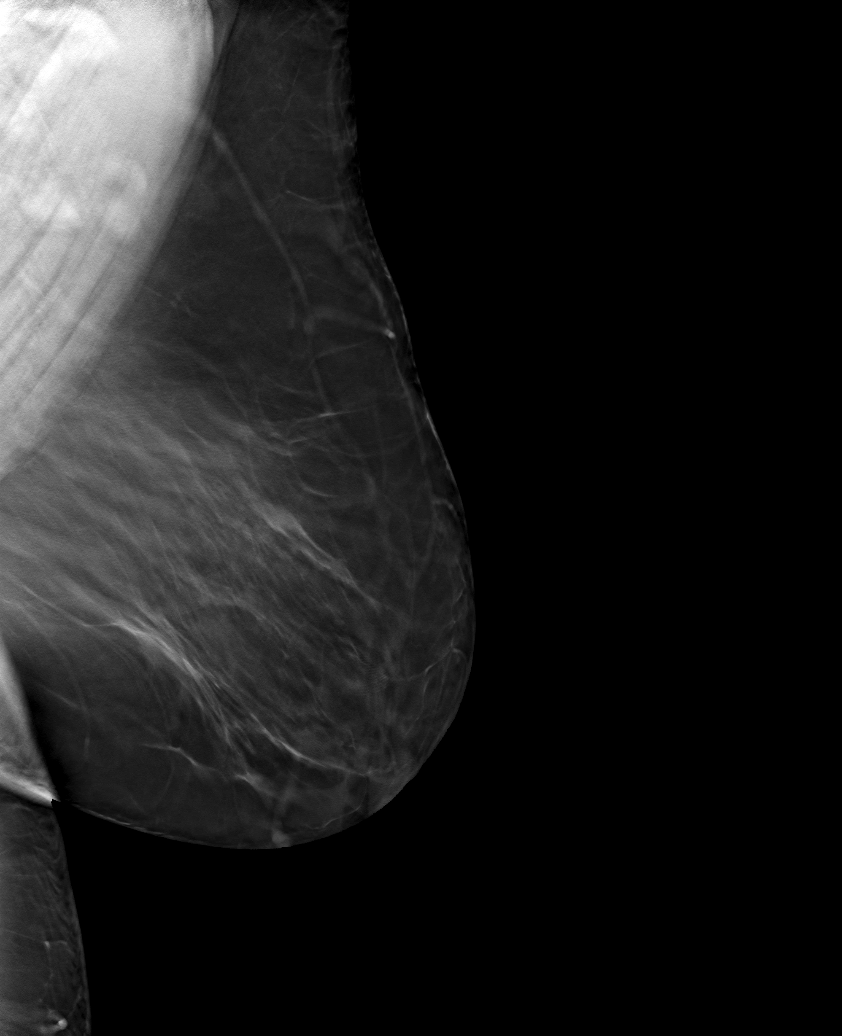

[6 of 30 positions shown; findings below may reference images not displayed]

ACR Breast Density Category b: There are scattered areas of
fibroglandular density.
FINDINGS: There are no findings suspicious for malignancy.
IMPRESSION: No mammographic evidence of malignancy. A result letter of this
screening mammogram will be mailed directly to the patient.

RECOMMENDATION:
Screening mammogram in one year. (Code:[id number]-[id number])

BI-RADS CATEGORY  1: Negative.

## 2023-04-23 ENCOUNTER — Other Ambulatory Visit: Payer: Self-pay | Admitting: Internal Medicine

## 2023-04-23 ENCOUNTER — Other Ambulatory Visit: Payer: Self-pay | Admitting: Surgical

## 2023-04-23 DIAGNOSIS — E1169 Type 2 diabetes mellitus with other specified complication: Secondary | ICD-10-CM

## 2023-05-01 ENCOUNTER — Other Ambulatory Visit: Payer: Self-pay | Admitting: Surgical

## 2023-05-03 ENCOUNTER — Other Ambulatory Visit: Payer: Self-pay | Admitting: Internal Medicine

## 2023-05-03 DIAGNOSIS — E1169 Type 2 diabetes mellitus with other specified complication: Secondary | ICD-10-CM

## 2023-05-03 NOTE — Telephone Encounter (Signed)
Medication Refill - Medication: Rx #: [id number]  Dulaglutide (TRULICITY) 1.5 MG/0.5ML SOPN [[id number]]   Has the patient contacted their pharmacy? Yes.   (Agent: If no, request that the patient contact the pharmacy for the refill. If patient does not wish to contact the pharmacy document the reason why and proceed with request.) (Agent: If yes, when and what did the pharmacy advise?)  Preferred Pharmacy (with phone number or street name): [facility] - [place], [place] - [address]  [address] Phone: [phone]  Fax: [phone]   Has the patient been seen for an appointment in the last year OR does the patient have an upcoming appointment? Yes.    Agent: Please be advised that RX refills may take up to 3 business days. We ask that you follow-up with your pharmacy.

## 2023-05-03 NOTE — Telephone Encounter (Signed)
Requested Prescriptions  Pending Prescriptions Disp Refills   Dulaglutide (TRULICITY) 1.5 MG/0.5ML SOPN 2 mL 6    Sig: Inject 1.5 mg into the skin once a week.     Endocrinology:  Diabetes - GLP-1 Receptor Agonists Failed - 05/03/2023  5:00 PM      Failed - HBA1C is between 0 and 7.9 and within 180 days    HbA1c, POC (controlled diabetic range)  Date Value Ref Range Status  12/14/2022 12.0 (A) 0.0 - 7.0 % Final         Passed - Valid encounter within last 6 months    Recent Outpatient Visits           4 months ago Type 2 diabetes mellitus with morbid obesity (HCC)   [place] [facility] Jonah Blue B, MD   1 year ago Type 2 diabetes mellitus with morbid obesity [facility])   [place] [facility] Jonah Blue B, MD   1 year ago Type 2 diabetes mellitus with morbid obesity [facility])   [place] [facility] Marcine Matar, MD   2 years ago Acute neck pain   [place] [facility] Marcine Matar, MD   2 years ago Need for vaccination against Streptococcus pneumoniae   [facility] Lois Huxley, Cornelius Moras, RPH-CPP       Future Appointments             In 5 days Marcine Matar, MD [facility]

## 2023-05-08 ENCOUNTER — Other Ambulatory Visit: Payer: Self-pay | Admitting: Internal Medicine

## 2023-05-08 ENCOUNTER — Ambulatory Visit: Payer: Commercial Managed Care - HMO | Admitting: Internal Medicine

## 2023-05-08 DIAGNOSIS — G8929 Other chronic pain: Secondary | ICD-10-CM

## 2023-05-08 DIAGNOSIS — M1712 Unilateral primary osteoarthritis, left knee: Secondary | ICD-10-CM

## 2023-05-08 NOTE — Telephone Encounter (Signed)
Medication Refill - Medication: traMADol (ULTRAM) 50 MG tablet  methocarbamol (ROBAXIN) 500 MG tablet  Dulaglutide (TRULICITY) 1.5 MG/0.5ML SOPN  Has the patient contacted their pharmacy? Yes.   (Agent: If no, request that the patient contact the pharmacy for the refill. If patient does not wish to contact the pharmacy document the reason why and proceed with request.) (Agent: If yes, when and what did the pharmacy advise?)  Preferred Pharmacy (with phone number or street name):  [facility] - [place], [place] - [address]  [address] [place] [address]  Phone: [phone] Fax: [phone]   Has the patient been seen for an appointment in the last year OR does the patient have an upcoming appointment? Yes.    Agent: Please be advised that RX refills may take up to 3 business days. We ask that you follow-up with your pharmacy.

## 2023-05-09 MED ORDER — TRULICITY 1.5 MG/0.5ML ~~LOC~~ SOAJ
1.5000 mg | SUBCUTANEOUS | 1 refills | Status: DC
Start: 2023-05-09 — End: 2023-06-06

## 2023-05-09 MED ORDER — METHOCARBAMOL 500 MG PO TABS
500.0000 mg | ORAL_TABLET | Freq: Two times a day (BID) | ORAL | 0 refills | Status: DC | PRN
Start: 2023-05-09 — End: 2023-09-06

## 2023-05-09 NOTE — Telephone Encounter (Signed)
Requested medication (s) are due for refill today: Yes both meds  Requested medication (s) are on the active medication list: Yes both meds    Last refill:  Robaxin  06/22/22  #40  0 refills     Tramadol  02/10/23  #15  0 refills  Future visit scheduled Yes 08/06/23  Notes to clinic:   Not delegated, please review. Thank you.  Requested Prescriptions  Pending Prescriptions Disp Refills   methocarbamol (ROBAXIN) 500 MG tablet 40 tablet 0    Sig: Take 1 tablet (500 mg total) by mouth 2 (two) times daily as needed for muscle spasms.     Not Delegated - Analgesics:  Muscle Relaxants Failed - 05/08/2023  2:51 PM      Failed - This refill cannot be delegated      Passed - Valid encounter within last 6 months    Recent Outpatient Visits           4 months ago Type 2 diabetes mellitus with morbid obesity (HCC)   [place] [facility] Jonah Blue B, MD   1 year ago Type 2 diabetes mellitus with morbid obesity [facility])   [place] [facility] Jonah Blue B, MD   1 year ago Type 2 diabetes mellitus with morbid obesity [facility])   [place] [facility] & [facility] Marcine Matar, MD   2 years ago Acute neck pain   [place] [facility] Marcine Matar, MD   2 years ago Need for vaccination against Streptococcus pneumoniae   [place] [facility] Drucilla Chalet, RPH-CPP       Future Appointments             In 2 months Marcine Matar, MD [place] [facility]             traMADol (ULTRAM) 50 MG tablet 15 tablet 0    Sig: Take 1 tablet (50 mg total) by mouth daily as needed.     Not Delegated - Analgesics:  Opioid Agonists Failed - 05/08/2023  2:51 PM      Failed - This refill cannot be delegated      Failed - Urine Drug Screen completed in last 360 days      Failed - Valid encounter within last 3 months    Recent  Outpatient Visits           4 months ago Type 2 diabetes mellitus with morbid obesity (HCC)   [place] [facility] Jonah Blue B, MD   1 year ago Type 2 diabetes mellitus with morbid obesity [facility])   [place] [facility] Jonah Blue B, MD   1 year ago Type 2 diabetes mellitus with morbid obesity [facility])   [place] [facility] & [facility] Marcine Matar, MD   2 years ago Acute neck pain    [place] [facility] Marcine Matar, MD   2 years ago Need for vaccination against Streptococcus pneumoniae   [place] [facility] Drucilla Chalet, RPH-CPP       Future Appointments             In 2 months Laural Benes, Binnie Rail, MD [facility]            Signed Prescriptions Disp  Refills   Dulaglutide (TRULICITY) 1.5 MG/0.5ML SOPN 2 mL 1    Sig: Inject 1.5 mg into the skin once a week.     Endocrinology:  Diabetes - GLP-1 Receptor Agonists Failed - 05/08/2023  2:51 PM      Failed - HBA1C is between 0 and 7.9 and within 180 days    HbA1c, POC (controlled diabetic range)  Date Value Ref Range Status  12/14/2022 12.0 (A) 0.0 - 7.0 % Final         Passed - Valid encounter within last 6 months    Recent Outpatient Visits           4 months ago Type 2 diabetes mellitus with morbid obesity (HCC)   [place] [facility] Jonah Blue B, MD   1 year ago Type 2 diabetes mellitus with morbid obesity [facility])   [place] [facility] Jonah Blue B, MD   1 year ago Type 2 diabetes mellitus with morbid obesity [facility])   [place] [facility] Marcine Matar, MD   2 years ago Acute neck pain   [place] [facility] Marcine Matar, MD   2 years ago Need for vaccination against Streptococcus pneumoniae   [facility] Drucilla Chalet, RPH-CPP       Future Appointments             In 2 months Laural Benes, Binnie Rail, MD [facility]

## 2023-05-09 NOTE — Telephone Encounter (Signed)
Requested Prescriptions  Pending Prescriptions Disp Refills   Dulaglutide (TRULICITY) 1.5 MG/0.5ML SOPN 2 mL 6    Sig: Inject 1.5 mg into the skin once a week.     Endocrinology:  Diabetes - GLP-1 Receptor Agonists Failed - 05/08/2023  2:51 PM      Failed - HBA1C is between 0 and 7.9 and within 180 days    HbA1c, POC (controlled diabetic range)  Date Value Ref Range Status  12/14/2022 12.0 (A) 0.0 - 7.0 % Final         Passed - Valid encounter within last 6 months    Recent Outpatient Visits           4 months ago Type 2 diabetes mellitus with morbid obesity (HCC)   [place] [facility] Jonah Blue B, MD   1 year ago Type 2 diabetes mellitus with morbid obesity [facility])   [place] [facility] Jonah Blue B, MD   1 year ago Type 2 diabetes mellitus with morbid obesity [facility])   [place] [facility] Marcine Matar, MD   2 years ago Acute neck pain   [place] [facility] Marcine Matar, MD   2 years ago Need for vaccination against Streptococcus pneumoniae   [facility] [place], Cornelius Moras, RPH-CPP       Future Appointments             In 2 months Marcine Matar, MD [facility]             methocarbamol (ROBAXIN) 500 MG tablet 40 tablet 0    Sig: Take 1 tablet (500 mg total) by mouth 2 (two) times daily as needed for muscle spasms.     Not Delegated - Analgesics:  Muscle Relaxants Failed - 05/08/2023  2:51 PM      Failed - This refill cannot be delegated      Passed - Valid encounter within last 6 months    Recent Outpatient Visits           4 months ago Type 2 diabetes mellitus with morbid obesity (HCC)   [place] [facility] Jonah Blue B, MD   1 year ago Type 2 diabetes mellitus with morbid obesity [facility])   [place] [facility] &  [facility] Jonah Blue B, MD   1 year ago Type 2 diabetes mellitus with morbid obesity [facility])   [place] [facility] Marcine Matar, MD   2 years ago Acute neck pain   [place] [facility] Marcine Matar, MD   2 years ago Need for vaccination against Streptococcus pneumoniae   [facility] Drucilla Chalet, RPH-CPP       Future Appointments             In 2 months Marcine Matar, MD [place] [facility]             traMADol (ULTRAM) 50 MG tablet 15 tablet 0    Sig: Take 1 tablet (50 mg total) by mouth daily as needed.     Not Delegated - Analgesics:  Opioid Agonists Failed - 05/08/2023  2:51 PM      Failed - This refill cannot be delegated  Failed - Urine Drug Screen completed in last 360 days      Failed - Valid encounter within last 3 months    Recent Outpatient Visits           4 months ago Type 2 diabetes mellitus with morbid obesity (HCC)   [place] [facility] Jonah Blue B, MD   1 year ago Type 2 diabetes mellitus with morbid obesity [facility])   [place] [facility] Jonah Blue B, MD   1 year ago Type 2 diabetes mellitus with morbid obesity [facility])   [place] [facility] & [facility] Marcine Matar, MD   2 years ago Acute neck pain   [place] [facility] Marcine Matar, MD   2 years ago Need for vaccination against Streptococcus pneumoniae   [facility] Drucilla Chalet, RPH-CPP       Future Appointments             In 2 months Laural Benes, Binnie Rail, MD [facility] & [facility]

## 2023-05-24 ENCOUNTER — Telehealth: Payer: Self-pay | Admitting: Internal Medicine

## 2023-05-24 ENCOUNTER — Other Ambulatory Visit: Payer: Self-pay | Admitting: Internal Medicine

## 2023-05-24 DIAGNOSIS — M1712 Unilateral primary osteoarthritis, left knee: Secondary | ICD-10-CM

## 2023-05-24 DIAGNOSIS — G8929 Other chronic pain: Secondary | ICD-10-CM

## 2023-05-24 NOTE — Telephone Encounter (Signed)
The patient called in stating she is disputing the fact that she was there to see her provider in February even though it shows the appt was cancelled. She is upset she has been denied her tramadol and wants to speak with her provider or nurse ASAP. Please assist patient further

## 2023-05-24 NOTE — Telephone Encounter (Signed)
Spoke with patient . Verified name & DOB  Advised patient that she will need to bee see by pcp before medication can be refilled. Patient has appointment for 06/22/2023

## 2023-06-06 ENCOUNTER — Other Ambulatory Visit: Payer: Self-pay | Admitting: Internal Medicine

## 2023-06-11 ENCOUNTER — Other Ambulatory Visit: Payer: Self-pay | Admitting: Internal Medicine

## 2023-06-11 DIAGNOSIS — I152 Hypertension secondary to endocrine disorders: Secondary | ICD-10-CM

## 2023-06-12 NOTE — Telephone Encounter (Signed)
Requested medication (s) are due for refill today:   Yes for both  Requested medication (s) are on the active medication list:   Yes for both  Future visit scheduled:   Yes 06/22/2023   Last ordered: Both 12/14/2022 #90, 1 refill  Returned because labs are due for the losartan.     Requested Prescriptions  Pending Prescriptions Disp Refills   losartan (COZAAR) 25 MG tablet [Pharmacy Med Name: losartan 25 mg tablet] 90 tablet 1    Sig: Take 1 tablet (25 mg total) by mouth daily.     Cardiovascular:  Angiotensin Receptor Blockers Failed - 06/11/2023  4:56 PM      Failed - Cr in normal range and within 180 days    Creatinine, Ser  Date Value Ref Range Status  09/20/2021 0.66 0.57 - 1.00 mg/dL Final         Failed - K in normal range and within 180 days    Potassium  Date Value Ref Range Status  09/20/2021 4.7 3.5 - 5.2 mmol/L Final         Passed - Patient is not pregnant      Passed - Last BP in normal range    BP Readings from Last 1 Encounters:  12/14/22 120/78         Passed - Valid encounter within last 6 months    Recent Outpatient Visits           6 months ago Type 2 diabetes mellitus with morbid obesity (HCC)   [place] [facility] Jonah Blue B, MD   1 year ago Type 2 diabetes mellitus with morbid obesity [facility])   [place] [facility] Jonah Blue B, MD   1 year ago Type 2 diabetes mellitus with morbid obesity [facility])   [facility] Marcine Matar, MD   2 years ago Acute neck pain   [place] [facility] Marcine Matar, MD   2 years ago Need for vaccination against Streptococcus pneumoniae   [facility] Drucilla Chalet, RPH-CPP       Future Appointments             In 1 week Marcine Matar, MD [place] [facility]             amLODipine (NORVASC) 5 MG  tablet [Pharmacy Med Name: amlodipine 5 mg tablet] 90 tablet 1    Sig: Take 1 tablet (5 mg total) by mouth daily.     Cardiovascular: Calcium Channel Blockers 2 Passed - 06/11/2023  4:56 PM      Passed - Last BP in normal range    BP Readings from Last 1 Encounters:  12/14/22 120/78         Passed - Last Heart Rate in normal range    Pulse Readings from Last 1 Encounters:  12/14/22 98         Passed - Valid encounter within last 6 months    Recent Outpatient Visits           6 months ago Type 2 diabetes mellitus with morbid obesity (HCC)   [place] [facility] Jonah Blue B, MD   1 year ago Type 2 diabetes mellitus with morbid obesity [facility])    [facility] Marcine Matar, MD   1 year  ago Type 2 diabetes mellitus with morbid obesity [facility])   [place] [facility] Marcine Matar, MD   2 years ago Acute neck pain   [facility] Marcine Matar, MD   2 years ago Need for vaccination against Streptococcus pneumoniae   [facility] Drucilla Chalet, RPH-CPP       Future Appointments             In 1 week Marcine Matar, MD [facility]

## 2023-06-22 ENCOUNTER — Ambulatory Visit: Payer: Medicaid Other | Admitting: Internal Medicine

## 2023-07-09 ENCOUNTER — Other Ambulatory Visit: Payer: Self-pay | Admitting: Internal Medicine

## 2023-07-09 DIAGNOSIS — E1169 Type 2 diabetes mellitus with other specified complication: Secondary | ICD-10-CM

## 2023-07-10 NOTE — Telephone Encounter (Signed)
Requested medications are due for refill today.  yes  Requested medications are on the active medications list.  yes  Last refill. 12/14/2022 #90 6 rf  Future visit scheduled.   yes  Notes to clinic.  Labs are expired.    Requested Prescriptions  Pending Prescriptions Disp Refills   metFORMIN (GLUCOPHAGE-XR) 500 MG 24 hr tablet [Pharmacy Med Name: metformin ER 500 mg tablet,extended release 24 hr] 90 tablet 6    Sig: TAKE ONE TABLET BY MOUTH EVERY MORNING AND TAKE TWO TABLETS BY MOUTH EVERY IN THE EVENING     Endocrinology:  Diabetes - Biguanides Failed - 07/09/2023  9:29 AM      Failed - Cr in normal range and within 360 days    Creatinine, Ser  Date Value Ref Range Status  09/20/2021 0.66 0.57 - 1.00 mg/dL Final         Failed - HBA1C is between 0 and 7.9 and within 180 days    HbA1c, POC (controlled diabetic range)  Date Value Ref Range Status  12/14/2022 12.0 (A) 0.0 - 7.0 % Final         Failed - eGFR in normal range and within 360 days    GFR calc Af Amer  Date Value Ref Range Status  12/24/2019 118 >59 mL/min/1.73 Final   GFR calc non Af Amer  Date Value Ref Range Status  12/24/2019 103 >59 mL/min/1.73 Final   eGFR  Date Value Ref Range Status  09/20/2021 107 >59 mL/min/1.73 Final         Failed - B12 Level in normal range and within 720 days    No results found for: "VITAMINB12"       Failed - Valid encounter within last 6 months    Recent Outpatient Visits           6 months ago Type 2 diabetes mellitus with morbid obesity (HCC)   [place] [facility] Jonah Blue B, MD   1 year ago Type 2 diabetes mellitus with morbid obesity [facility])   [facility] Jonah Blue B, MD   1 year ago Type 2 diabetes mellitus with morbid obesity [facility])   [place] [facility] Marcine Matar, MD   2 years ago Acute neck pain    [facility]  Marcine Matar, MD   2 years ago Need for vaccination against Streptococcus pneumoniae   [facility] Drucilla Chalet, RPH-CPP       Future Appointments             In 1 month Marcine Matar, MD [facility]            Failed - CBC within normal limits and completed in the last 12 months    WBC  Date Value Ref Range Status  09/20/2021 10.3 3.4 - 10.8 x10E3/uL Final  05/15/2018 9.0 4.0 - 10.5 K/uL Final   RBC  Date Value Ref Range Status  09/20/2021 4.65 3.77 - 5.28 x10E6/uL Final  05/15/2018 4.62 3.87 - 5.11 MIL/uL Final   Hemoglobin  Date Value Ref Range Status  09/20/2021 13.6 11.1 - 15.9 g/dL Final   Hematocrit  Date Value Ref Range Status  09/20/2021 41.6 34.0 - 46.6 % Final   MCHC  Date Value Ref Range Status  09/20/2021 32.7 31.5 - 35.7 g/dL Final  05/15/2018 33.5 30.0 - 36.0 g/dL Final   [facility]  Date Value Ref Range Status  09/20/2021 29.2 26.6 - 33.0 pg Final  05/15/2018 29.9 26.0 - 34.0 pg Final   MCV  Date Value Ref Range Status  09/20/2021 90 79 - 97 fL Final   No results found for: "PLTCOUNTKUC", "LABPLAT", "POCPLA" RDW  Date Value Ref Range Status  09/20/2021 13.0 11.7 - 15.4 % Final

## 2023-07-11 ENCOUNTER — Other Ambulatory Visit: Payer: Self-pay | Admitting: Internal Medicine

## 2023-07-11 DIAGNOSIS — I152 Hypertension secondary to endocrine disorders: Secondary | ICD-10-CM

## 2023-07-17 ENCOUNTER — Other Ambulatory Visit: Payer: Self-pay | Admitting: Surgical

## 2023-07-24 ENCOUNTER — Other Ambulatory Visit: Payer: Self-pay | Admitting: Surgical

## 2023-07-25 ENCOUNTER — Ambulatory Visit: Payer: Medicaid Other | Admitting: Orthopedic Surgery

## 2023-07-30 ENCOUNTER — Ambulatory Visit: Payer: Medicaid Other | Admitting: Orthopedic Surgery

## 2023-08-06 ENCOUNTER — Ambulatory Visit: Payer: Commercial Managed Care - HMO | Admitting: Internal Medicine

## 2023-08-08 ENCOUNTER — Other Ambulatory Visit: Payer: Self-pay | Admitting: Internal Medicine

## 2023-08-31 ENCOUNTER — Other Ambulatory Visit: Payer: Self-pay | Admitting: Internal Medicine

## 2023-08-31 DIAGNOSIS — E1169 Type 2 diabetes mellitus with other specified complication: Secondary | ICD-10-CM

## 2023-08-31 DIAGNOSIS — E1159 Type 2 diabetes mellitus with other circulatory complications: Secondary | ICD-10-CM

## 2023-09-06 ENCOUNTER — Encounter: Payer: Self-pay | Admitting: Internal Medicine

## 2023-09-06 ENCOUNTER — Ambulatory Visit: Payer: Medicaid Other | Attending: Internal Medicine | Admitting: Internal Medicine

## 2023-09-06 DIAGNOSIS — Z794 Long term (current) use of insulin: Secondary | ICD-10-CM

## 2023-09-06 DIAGNOSIS — Z7985 Long-term (current) use of injectable non-insulin antidiabetic drugs: Secondary | ICD-10-CM

## 2023-09-06 DIAGNOSIS — I152 Hypertension secondary to endocrine disorders: Secondary | ICD-10-CM

## 2023-09-06 DIAGNOSIS — E1159 Type 2 diabetes mellitus with other circulatory complications: Secondary | ICD-10-CM

## 2023-09-06 DIAGNOSIS — J4530 Mild persistent asthma, uncomplicated: Secondary | ICD-10-CM

## 2023-09-06 DIAGNOSIS — Z1211 Encounter for screening for malignant neoplasm of colon: Secondary | ICD-10-CM

## 2023-09-06 DIAGNOSIS — Z7984 Long term (current) use of oral hypoglycemic drugs: Secondary | ICD-10-CM

## 2023-09-06 DIAGNOSIS — G8929 Other chronic pain: Secondary | ICD-10-CM

## 2023-09-06 DIAGNOSIS — E119 Type 2 diabetes mellitus without complications: Secondary | ICD-10-CM

## 2023-09-06 DIAGNOSIS — M1711 Unilateral primary osteoarthritis, right knee: Secondary | ICD-10-CM

## 2023-09-06 DIAGNOSIS — E1169 Type 2 diabetes mellitus with other specified complication: Secondary | ICD-10-CM

## 2023-09-06 LAB — GLUCOSE, POCT (MANUAL RESULT ENTRY): POC Glucose: 338 mg/dl — AB (ref 70–99)

## 2023-09-06 LAB — POCT GLYCOSYLATED HEMOGLOBIN (HGB A1C): HbA1c, POC (controlled diabetic range): 14.6 % — AB (ref 0.0–7.0)

## 2023-09-06 MED ORDER — LOSARTAN POTASSIUM 25 MG PO TABS
25.0000 mg | ORAL_TABLET | Freq: Every day | ORAL | 1 refills | Status: DC
Start: 2023-09-06 — End: 2024-05-06

## 2023-09-06 MED ORDER — ACCU-CHEK GUIDE VI STRP
ORAL_STRIP | 11 refills | Status: DC
Start: 2023-09-06 — End: 2024-08-28

## 2023-09-06 MED ORDER — BASAGLAR KWIKPEN 100 UNIT/ML ~~LOC~~ SOPN
20.0000 [IU] | PEN_INJECTOR | Freq: Every day | SUBCUTANEOUS | 6 refills | Status: DC
Start: 2023-09-06 — End: 2024-03-11

## 2023-09-06 MED ORDER — ACCU-CHEK SOFTCLIX LANCETS MISC: 0 refills | Status: AC

## 2023-09-06 MED ORDER — ACCU-CHEK GUIDE W/DEVICE KIT
PACK | 0 refills | Status: AC
Start: 2023-09-06 — End: ?

## 2023-09-06 MED ORDER — TRAMADOL HCL 50 MG PO TABS
50.0000 mg | ORAL_TABLET | Freq: Every day | ORAL | 0 refills | Status: DC | PRN
Start: 2023-09-06 — End: 2023-09-24

## 2023-09-06 MED ORDER — METFORMIN HCL ER 500 MG PO TB24
ORAL_TABLET | ORAL | 6 refills | Status: DC
Start: 2023-09-06 — End: 2024-04-23

## 2023-09-06 MED ORDER — TRULICITY 3 MG/0.5ML ~~LOC~~ SOAJ: 3.0000 mg | SUBCUTANEOUS | 6 refills | Status: DC

## 2023-09-06 MED ORDER — METHOCARBAMOL 500 MG PO TABS
500.0000 mg | ORAL_TABLET | Freq: Two times a day (BID) | ORAL | 1 refills | Status: DC | PRN
Start: 2023-09-06 — End: 2024-03-11

## 2023-09-06 MED ORDER — FREESTYLE LIBRE 3 SENSOR MISC
6 refills | Status: DC
Start: 2023-09-06 — End: 2024-03-12

## 2023-09-06 NOTE — Progress Notes (Signed)
Patient ID: BROOKLEY OSIKA, female    DOB: [birth year]  MRN: [id number]  CC: Diabetes (DM f/u. Med refills /Requesting referral to neurosurgeon - Dr.Pool for lump on neck X1 yr/Requesting new BS meter. No to flu vax)   Subjective: Nellia Fusco is a 52 y.o. female who presents for chronic ds management. 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  Confirms that she has been taking her medications consistently.  Tolerating Trulicity.  No significant change in weight since last visit with me in December.  Discouraged that her A1c has not decreased. Not checking BS.  Needs new testing supplies.  Not doing a lot of cooking for last few mths; spouse has been sick; brother-in-law just died.  Not much exercise as she has been having to take care of her husband.    HTN:  taking Norvasc 5 mg daily and Losartan 25 mg daily Not checking BP Limits salt in foods.  No chest pain.  No lower extremity edema.  Asthma:  Asthma under go control on Dulera.  No recent flares.   Neck pain: swollen and hurts at back of the neck.  Thinks she has a lump there.  She has intermittent flareup of pain and when it occurs it can be severe and intense.  Pain sometimes radiates across the shoulder blade but no radiation down the arms.  No numbness or tingling in the arms.  Voltaren  gel helps but lasts only for 15 minutes. CT scan 2021 that revealed multilevel disc disease and osteophytosis.  Request refill on tramadol and muscle relaxant.  Also request referral to Dr. Dutch Quint.  She has been seeing Dr. August Saucer for her lower back and arthritis in the knees.  Last saw him in July of last year.  He had prescribed Celebrex.  HM:  decline flu today because she has to go pick up her daughter.  Has cologuard kit at home from last year which she has not turned in as yet.  States that she will do so. Patient Active Problem List   Diagnosis Date Noted   Class 3 severe obesity due to excess calories with serious comorbidity and body mass index (BMI) of 40.0 to 44.9 in adult [facility]) 09/20/2021   Spondylosis without myelopathy or radiculopathy, cervical region 02/12/2019   Type 2 diabetes mellitus without complication, without long-term current use of insulin (HCC) 05/15/2018     Current Outpatient Medications on File Prior to Visit  Medication Sig Dispense Refill   albuterol (VENTOLIN HFA) 108 (90 Base) MCG/ACT inhaler Inhale 2 puffs into the lungs every 6 (six) hours as needed for wheezing or shortness of breath. 6.7 g 0   amLODipine (NORVASC) 5 MG tablet TAKE ONE TABLET BY MOUTH EVERY DAY 90 tablet 0  celecoxib (CELEBREX) 100 MG capsule TAKE ONE CAPSULE BY MOUTH TWICE DAILY 60 capsule 0   albuterol (PROVENTIL) (2.5 MG/3ML) 0.083% nebulizer solution Take 3 mLs (2.5 mg total) by nebulization every 6 (six) hours as needed for wheezing. (Patient not taking: Reported on 09/06/2023) 75 mL 0   diclofenac Sodium (VOLTAREN) 1 % GEL APPLY TWO grams topically FOUR TIMES DAILY (Patient not taking: Reported on 09/06/2023) 100 g 0   DULERA 100-5 MCG/ACT AERO Inhale 2 puffs into the lungs 2 (two) times daily. (Patient not taking: Reported on 09/06/2023) 1 each 6   hydrocortisone 2.5 % cream Apply once daily to affected area x 5 days. (Patient not taking: Reported on 09/06/2023) 30 g 0   Insulin Pen Needle  (LITETOUCH PEN NEEDLES) 31G X 6 MM MISC USE TO administer insulin EVERY DAY (Patient not taking: Reported on 09/06/2023) 100 each 0   Lancets Misc. (ACCU-CHEK FASTCLIX LANCET) KIT Check blood glucose at least 3 times daily. (Patient not taking: Reported on 09/06/2023) 1 kit 6   No current facility-administered medications on file prior to visit.    Allergies  Allergen Reactions   Ibuprofen Nausea And Vomiting    Social History   Socioeconomic History   Marital status: Married    Spouse name: Not on file   Number of children: Not on file   Years of education: Not on file   Highest education level: Not on file  Occupational History   Not on file  Tobacco Use   Smoking status: Never   Smokeless tobacco: Never  Substance and Sexual Activity   Alcohol use: Yes    Comment: occasionally    Drug use: No   Sexual activity: Not on file  Other Topics Concern   Not on file  Social History Narrative   Not on file   Social Determinants of Health   Financial Resource Strain: Not on file  Food Insecurity: Not on file  Transportation Needs: Not on file  Physical Activity: Not on file  Stress: Not on file  Social Connections: Not on file  Intimate Partner Violence: Not on file    Family History  Problem Relation Age of Onset   Hypertension Mother    Diabetes Mother    Hypertension Father    Diabetes Father    CVA Brother     Past Surgical History:  Procedure Laterality Date   TUBAL LIGATION      ROS: Review of Systems Negative except as stated above  PHYSICAL EXAM: BP 100/78 (BP Location: Left Arm, Patient Position: Sitting, Cuff Size: Large)   Pulse (!) 106   Temp 97.9 F (36.6 C) (Oral)   Ht 5\' 4"  (1.626 m)   Wt 258 lb (117 kg)   SpO2 93%   BMI 44.29 kg/m   Wt Readings from Last 3 Encounters:  09/06/23 258 lb (117 kg)  12/14/22 259 lb 6.4 oz (117.7 kg)  03/13/22 271 lb (122.9 kg)    Physical Exam  General appearance - alert, well appearing, middle-age to  older African-American female and in no distress Mental status - normal mood, behavior, speech, dress, motor activity, and thought processes Neck -no mass appreciated in the posterior neck.  She has mild tenderness in the lower cervical spine vertebrae.  Mild discomfort with posterior extension of the neck. Chest - clear to auscultation, no wheezes, rales or rhonchi, symmetric air entry Heart - normal rate, regular rhythm, normal S1, S2, no murmurs, rubs, clicks or gallops Extremities - peripheral  pulses normal, no pedal edema, no clubbing or cyanosis Diabetic Foot Exam - Simple   Simple Foot Form Diabetic Foot exam was performed with the following findings: Yes 09/06/2023  6:17 PM  Visual Inspection No deformities, no ulcerations, no other skin breakdown bilaterally: Yes Sensation Testing Intact to touch and monofilament testing bilaterally: Yes Pulse Check Posterior Tibialis and Dorsalis pulse intact bilaterally: Yes Comments         Latest Ref Rng & Units 09/20/2021    4:17 PM 12/24/2019    3:15 PM 01/28/2019    4:53 PM  CMP  Glucose 70 - 99 mg/dL 347  425  956   BUN 6 - 24 mg/dL 7  13  9    Creatinine 0.57 - 1.00 mg/dL 3.87  5.64  3.32   Sodium 134 - 144 mmol/L 139  137  139   Potassium 3.5 - 5.2 mmol/L 4.7  4.8  4.7   Chloride 96 - 106 mmol/L 100  98  99   CO2 20 - 29 mmol/L 22  23  20    Calcium 8.7 - 10.2 mg/dL 95.1  88.4  16.6   Total Protein 6.0 - 8.5 g/dL 7.3  7.4  7.5   Total Bilirubin 0.0 - 1.2 mg/dL 0.3  0.3  0.3   Alkaline Phos 44 - 121 IU/L 30  33  25   AST 0 - 40 IU/L 22  10  12    ALT 0 - 32 IU/L 27  12  18     Lipid Panel     Component Value Date/Time   CHOL 120 09/20/2021 1617   TRIG 55 09/20/2021 1617   HDL 68 09/20/2021 1617   CHOLHDL 1.8 09/20/2021 1617   LDLCALC 40 09/20/2021 1617    CBC    Component Value Date/Time   WBC 10.3 09/20/2021 1617   WBC 9.0 05/15/2018 0843   RBC 4.65 09/20/2021 1617   RBC 4.62 05/15/2018 0843   HGB 13.6 09/20/2021  1617   HCT 41.6 09/20/2021 1617   PLT 406 09/20/2021 1617   MCV 90 09/20/2021 1617   MCH 29.2 09/20/2021 1617   MCH 29.9 05/15/2018 0843   MCHC 32.7 09/20/2021 1617   MCHC 33.5 05/15/2018 0843   RDW 13.0 09/20/2021 1617   LYMPHSABS 3.3 (H) 12/24/2019 1515   EOSABS 0.2 12/24/2019 1515   BASOSABS 0.1 12/24/2019 1515    ASSESSMENT AND PLAN: 1. Type 2 diabetes mellitus with morbid obesity (HCC) Not at goal and A1c significantly elevated.  She reports compliance with all of her medications including insulin. Recommend increase glargine insulin to 20 units daily and Trulicity to 3 mg once a week.  Advised to watch out for any side effects from the Trulicity with the increased dose including nausea, vomiting, abdominal pain or severe diarrhea.  Encourage healthy eating habits.  Follow-up in 7 weeks for recheck.  We will try to get her approved for continuous glucose monitor.  Sent prescription for manual monitor in case she is not approved. - POCT glycosylated hemoglobin (Hb A1C) - POCT glucose (manual entry) - Accu-Chek Softclix Lancets lancets; USE TO CHECK BLOOD SUGAR THREE TIMES DAILY AS DIRECTED  Dispense: 100 each; Refill: 0 - glucose blood (ACCU-CHEK GUIDE) test strip; USE TO check blood sugar three times daily  Dispense: 100 strip; Refill: 11 - Insulin Glargine (BASAGLAR KWIKPEN) 100 UNIT/ML; Inject 20 Units into the skin daily.  Dispense: 15 mL; Refill: 6 - Continuous Glucose Sensor (FREESTYLE LIBRE 3 SENSOR) MISC; Place  1 sensor on the skin every 14 days. Use to check glucose continuously  Dispense: 2 each; Refill: 6 - metFORMIN (GLUCOPHAGE-XR) 500 MG 24 hr tablet; TAKE ONE TABLET BY MOUTH EVERY MORNING AND TAKE TWO TABLETS BY MOUTH EVERY IN THE EVENING  Dispense: 90 tablet; Refill: 6 - Dulaglutide (TRULICITY) 3 MG/0.5ML SOPN; Inject 3 mg as directed once a week.  Dispense: 2 mL; Refill: 6  2. Insulin long-term use (HCC) See #1 above  3. Long-term (current) use of injectable  non-insulin antidiabetic drugs See #1 above  4. Diabetes mellitus treated with oral medication (HCC) See #1 above  5. Hypertension associated with diabetes (HCC) At goal.  Continue Norvasc and Cozaar. - losartan (COZAAR) 25 MG tablet; Take 1 tablet (25 mg total) by mouth daily.  Dispense: 90 tablet; Refill: 1  6. Chronic neck pain Refill given on Robaxin.  Short course of tramadol given as well.  [place] controlled substance reporting system reviewed. - traMADol (ULTRAM) 50 MG tablet; Take 1 tablet (50 mg total) by mouth daily as needed.  Dispense: 15 tablet; Refill: 0 - methocarbamol (ROBAXIN) 500 MG tablet; Take 1 tablet (500 mg total) by mouth 2 (two) times daily as needed for muscle spasms.  Dispense: 180 tablet; Refill: 1 - Ambulatory referral to Neurosurgery  7. Primary osteoarthritis of right knee - traMADol (ULTRAM) 50 MG tablet; Take 1 tablet (50 mg total) by mouth daily as needed.  Dispense: 15 tablet; Refill: 0  8. Mild persistent asthma without complication Well-controlled on Dulera  9. Screening for colon cancer Strongly encouraged her to use the Cologuard test and send it in for screening.  Patient deferred getting flu shot today as she was in a rush.  She will get it at her outside pharmacy.   Patient was given the opportunity to ask questions.  Patient verbalized understanding of the plan and was able to repeat key elements of the plan.   This documentation was completed using Paediatric nurse.  Any transcriptional errors are unintentional.  Orders Placed This Encounter  Procedures   Ambulatory referral to Neurosurgery   POCT glycosylated hemoglobin (Hb A1C)   POCT glucose (manual entry)     Requested Prescriptions   Signed Prescriptions Disp Refills   Accu-Chek Softclix Lancets lancets 100 each 0    Sig: USE TO CHECK BLOOD SUGAR THREE TIMES DAILY AS DIRECTED   glucose blood (ACCU-CHEK GUIDE) test strip 100 strip 11    Sig: USE  TO check blood sugar three times daily   traMADol (ULTRAM) 50 MG tablet 15 tablet 0    Sig: Take 1 tablet (50 mg total) by mouth daily as needed.   losartan (COZAAR) 25 MG tablet 90 tablet 1    Sig: Take 1 tablet (25 mg total) by mouth daily.   methocarbamol (ROBAXIN) 500 MG tablet 180 tablet 1    Sig: Take 1 tablet (500 mg total) by mouth 2 (two) times daily as needed for muscle spasms.   Insulin Glargine (BASAGLAR KWIKPEN) 100 UNIT/ML 15 mL 6    Sig: Inject 20 Units into the skin daily.   Continuous Glucose Sensor (FREESTYLE LIBRE 3 SENSOR) MISC 2 each 6    Sig: Place 1 sensor on the skin every 14 days. Use to check glucose continuously   metFORMIN (GLUCOPHAGE-XR) 500 MG 24 hr tablet 90 tablet 6    Sig: TAKE ONE TABLET BY MOUTH EVERY MORNING AND TAKE TWO TABLETS BY MOUTH EVERY IN THE EVENING  Dulaglutide (TRULICITY) 3 MG/0.5ML SOPN 2 mL 6    Sig: Inject 3 mg as directed once a week.   Blood Glucose Monitoring Suppl (ACCU-CHEK GUIDE) w/Device KIT 1 kit 0    Sig: Check blood sugars 3 times a day before meals.    Return in about 7 weeks (around 10/25/2023) for give lab appt in 1 wk.  Jonah Blue, MD, FACP

## 2023-09-07 ENCOUNTER — Telehealth: Payer: Self-pay

## 2023-09-07 ENCOUNTER — Telehealth: Payer: Self-pay | Admitting: Internal Medicine

## 2023-09-07 ENCOUNTER — Other Ambulatory Visit: Payer: Self-pay | Admitting: Pharmacist

## 2023-09-07 ENCOUNTER — Other Ambulatory Visit: Payer: Self-pay

## 2023-09-07 DIAGNOSIS — E119 Type 2 diabetes mellitus without complications: Secondary | ICD-10-CM

## 2023-09-07 MED ORDER — FREESTYLE LIBRE 3 READER DEVI: 0 refills | Status: DC

## 2023-09-07 NOTE — Telephone Encounter (Signed)
The patient called in stating her traMADol (ULTRAM) 50 MG tablet requires a prior authorization through her insurance. Please assist patient further

## 2023-09-07 NOTE — Telephone Encounter (Signed)
Dominique with [employer], called and stated that Dr Laural Benes sent in an [facility] meter for the patient but her insurance will not cover this and that the coupon will not provide patient with another meter. Dondra Prader was questioning if the office can provide patient with another meter. Please follow back up with the pharmacy.  [facility] [phone]

## 2023-09-07 NOTE — Telephone Encounter (Signed)
Pharmacy Patient Advocate Encounter  Received notification from [facility] that Prior Authorization for TRAMADOL has been APPROVED from 09/07/2023 to 03/05/2024   PA #/Case ID/Reference #: [id number] Pharmacy Patient Advocate Encounter  Received notification from [facility] Medicaid that Prior Authorization for FREESTYLE LIBRE 3 SENSOR has been APPROVED from 09/07/2023 to 03/05/2024   PA #/Case ID/Reference #: [id number]  [facility] contacted and were able to process both prescription on insurance.

## 2023-09-19 DIAGNOSIS — M4802 Spinal stenosis, cervical region: Secondary | ICD-10-CM | POA: Diagnosis not present

## 2023-09-19 DIAGNOSIS — M488X9 Other specified spondylopathies, site unspecified: Secondary | ICD-10-CM | POA: Diagnosis not present

## 2023-09-19 DIAGNOSIS — Z6841 Body Mass Index (BMI) 40.0 and over, adult: Secondary | ICD-10-CM | POA: Diagnosis not present

## 2023-09-21 ENCOUNTER — Other Ambulatory Visit (HOSPITAL_COMMUNITY): Payer: Self-pay | Admitting: Neurosurgery

## 2023-09-21 DIAGNOSIS — M4802 Spinal stenosis, cervical region: Secondary | ICD-10-CM

## 2023-09-24 ENCOUNTER — Ambulatory Visit (HOSPITAL_COMMUNITY)
Admission: RE | Admit: 2023-09-24 | Discharge: 2023-09-24 | Disposition: A | Discharge status: Home / Self Care | Payer: Medicaid Other | Source: Ambulatory Visit | Attending: Neurosurgery | Admitting: Neurosurgery

## 2023-09-24 ENCOUNTER — Other Ambulatory Visit: Payer: Self-pay | Admitting: Internal Medicine

## 2023-09-24 DIAGNOSIS — M4802 Spinal stenosis, cervical region: Secondary | ICD-10-CM | POA: Diagnosis not present

## 2023-09-24 DIAGNOSIS — M5021 Other cervical disc displacement,  high cervical region: Secondary | ICD-10-CM | POA: Diagnosis not present

## 2023-09-24 DIAGNOSIS — G8929 Other chronic pain: Secondary | ICD-10-CM

## 2023-09-24 DIAGNOSIS — M1711 Unilateral primary osteoarthritis, right knee: Secondary | ICD-10-CM

## 2023-09-24 NOTE — Telephone Encounter (Signed)
Medication Refill - Medication:  traMADol (ULTRAM) 50 MG tablet   Has the patient contacted their pharmacy? No. (Agent: If no, request that the patient contact the pharmacy for the refill. If patient does not wish to contact the pharmacy document the reason why and proceed with request.) (Agent: If yes, when and what did the pharmacy advise?)  Preferred Pharmacy (with phone number or street name): [facility] - [place], [place] - [id number]  Has the patient been seen for an appointment in the last year OR does the patient have an upcoming appointment? Yes.    Agent: Please be advised that RX refills may take up to 3 business days. We ask that you follow-up with your pharmacy.

## 2023-09-25 NOTE — Telephone Encounter (Signed)
Requested medication (s) are due for refill today:yes  Requested medication (s) are on the active medication list: yes  Last refill:  09/06/23 #15   Future visit scheduled:no  Notes to clinic:  med not delegated to NT to RF   Requested Prescriptions  Pending Prescriptions Disp Refills   traMADol (ULTRAM) 50 MG tablet 15 tablet 0    Sig: Take 1 tablet (50 mg total) by mouth daily as needed.     Not Delegated - Analgesics:  Opioid Agonists Failed - 09/24/2023  2:23 PM      Failed - This refill cannot be delegated      Failed - Urine Drug Screen completed in last 360 days      Passed - Valid encounter within last 3 months    Recent Outpatient Visits           2 weeks ago Type 2 diabetes mellitus with morbid obesity (HCC)   [place] [facility] Jonah Blue B, MD   9 months ago Type 2 diabetes mellitus with morbid obesity [facility])   [place] [facility] Jonah Blue B, MD   1 year ago Type 2 diabetes mellitus with morbid obesity [facility])   [facility] Jonah Blue B, MD   2 years ago Type 2 diabetes mellitus with morbid obesity [facility])   [place] [facility] Marcine Matar, MD   2 years ago Acute neck pain   [place] [facility] Marcine Matar, MD

## 2023-09-26 MED ORDER — TRAMADOL HCL 50 MG PO TABS: 50.0000 mg | ORAL_TABLET | Freq: Every day | ORAL | 0 refills | Status: DC | PRN

## 2023-09-27 ENCOUNTER — Other Ambulatory Visit: Payer: Self-pay | Admitting: Surgical

## 2023-10-02 ENCOUNTER — Other Ambulatory Visit: Payer: Self-pay | Admitting: Surgical

## 2023-10-17 DIAGNOSIS — Z6841 Body Mass Index (BMI) 40.0 and over, adult: Secondary | ICD-10-CM | POA: Diagnosis not present

## 2023-10-17 DIAGNOSIS — M4802 Spinal stenosis, cervical region: Secondary | ICD-10-CM | POA: Diagnosis not present

## 2023-10-23 ENCOUNTER — Other Ambulatory Visit: Payer: Self-pay

## 2023-11-01 ENCOUNTER — Other Ambulatory Visit: Payer: Self-pay | Admitting: Surgical

## 2023-11-02 ENCOUNTER — Other Ambulatory Visit: Payer: Self-pay

## 2023-11-02 ENCOUNTER — Encounter: Payer: Self-pay | Admitting: Physical Therapy

## 2023-11-02 ENCOUNTER — Ambulatory Visit: Payer: Medicaid Other | Attending: Neurosurgery | Admitting: Physical Therapy

## 2023-11-02 DIAGNOSIS — M6281 Muscle weakness (generalized): Secondary | ICD-10-CM | POA: Insufficient documentation

## 2023-11-02 DIAGNOSIS — M542 Cervicalgia: Secondary | ICD-10-CM | POA: Insufficient documentation

## 2023-11-02 NOTE — Therapy (Signed)
OUTPATIENT PHYSICAL THERAPY SHOULDER EVALUATION   Patient Name: Elaine Marquez MRN: [id number] DOB:Mar 14, 1971, 52 y.o., female Today's Date: 11/02/2023   PT End of Session - 11/02/23 1121     Visit Number 1    Number of Visits --   1-2x/week   Date for PT Re-Evaluation 12/28/23    Authorization Type [employer] - FOTO    PT Start Time 1054    PT Stop Time 1122    PT Time Calculation (min) 28 min             Past Medical History:  Diagnosis Date   Asthma    Diabetes mellitus without complication (HCC)    Hypertension    Peritonsillar abscess    Past Surgical History:  Procedure Laterality Date   TUBAL LIGATION     Patient Active Problem List   Diagnosis Date Noted   Class 3 severe obesity due to excess calories with serious comorbidity and body mass index (BMI) of 40.0 to 44.9 in adult [facility]) 09/20/2021   Spondylosis without myelopathy or radiculopathy, cervical region 02/12/2019   Type 2 diabetes mellitus without complication, without long-term current use of insulin (HCC) 05/15/2018    PCP: Marcine Matar, MD  REFERRING PROVIDER: Julio Sicks, MD  THERAPY DIAG:  Cervicalgia  Muscle weakness  REFERRING DIAG: Spinal stenosis, cervical region [M48.02]   Rationale for Evaluation and Treatment:  Rehabilitation  SUBJECTIVE:  PERTINENT PAST HISTORY:  DMII      PRECAUTIONS: None  WEIGHT BEARING RESTRICTIONS No  FALLS:  Has patient fallen in last 6 months? No, Number of falls: 0  MOI/History of condition:  Onset date: >2 years  SUBJECTIVE STATEMENT  Elaine Marquez is a 52 y.o. female who presents to clinic with chief complaint of chronic neck pain.  Slow progression with significantly worsening sxs over the last year.  Denies any UE sxs.  MRI shows significant degenerative changes.   Red flags:  denies   Pain:  Are you having pain? Yes Pain location: neck to UT region NPRS scale:  6/10 to 9/10 Aggravating factors: any shoulder  or neck motion, sleep Relieving factors: medication, hot shower Pain description: constant, sharp, and aching Stage: Chronic 24 hour pattern: worse first thing in morning and with activity   Occupation: NA  Assistive Device: NA  Hand Dominance: R  Patient Goals/Specific Activities: reduce pain   OBJECTIVE:   DIAGNOSTIC FINDINGS:  IMPRESSION: 1. At C3-4 there is a moderate broad disc osteophyte complex contacting the ventral cervical spinal cord. Severe left foraminal stenosis. Moderate right foraminal stenosis. Mild spinal stenosis. 2. At C4-5 there is a mild disc osteophyte complex with a more focal broad right paracentral component flattening the ventral right paracentral cervical spinal cord. Moderate bilateral foraminal stenosis. Mild central canal stenosis. 3. At C5-6 there is a mild disc osteophyte complex. Moderate left and mild right foraminal stenosis. Mild spinal stenosis. 4. No acute osseous injury of the cervical spine.  GENERAL OBSERVATION:  Forward head rounded shoulders     SENSATION:  Light touch: Appears intact   PALPATION: Significant TTP sub  Cervical ROM  ROM ROM  (Eval)  Flexion 20* paraspinals  Extension 20*  Right lateral flexion 20* UT  Left lateral flexion 20* UT  Right rotation 30*  Left rotation 40*  Flexion rotation (normal is 30 degrees)   Flexion rotation (normal is 30 degrees)     (Blank rows = not tested, N = WNL, * = concordant pain)  UPPER EXTREMITY MMT:  MMT Right (Eval) Left (Eval)  Shoulder flexion 4* 4*  Shoulder abduction (C5)    Shoulder ER 4* 4*  Shoulder IR    Middle trapezius 3* shoulder  2+* shoulder  Lower trapezius    Shoulder extension    Grip strength    Shoulder shrug (C4)    Elbow flexion (C6)    Elbow ext (C7)    Thumb ext (C8)    Finger abd (T1)    Grossly     (Blank rows = not tested, score listed is out of 5 possible points.  N = WNL, D = diminished, C = clear for gross weakness with  myotome testing, * = concordant pain with testing)   JOINT MOBILITY TESTING:  Hypomobile cervical spine  PATIENT SURVEYS:  FOTO 37 -> 52    TODAY'S TREATMENT:  Therapeutic Exercise: Creating, reviewing, and completing below HEP    PATIENT EDUCATION:  POC, diagnosis, prognosis, HEP, and outcome measures.  Pt educated via explanation, demonstration, and handout (HEP).  Pt confirms understanding verbally.    HOME EXERCISE PROGRAM: Access Code: [email or web address] URL: [email or web address] Date: 11/02/2023 Prepared by: Alphonzo Severance  Exercises - Supine Cervical Retraction with Towel  - 2 x daily - 7 x weekly - 2 sets - 10 reps - 5 second hold - Seated Upper Trapezius Stretch  - 2-3 x daily - 7 x weekly - 3 reps - 45 second hold - Seated Scapular Retraction  - 1 x daily - 7 x weekly - 2 sets - 10 reps - 5 hold  Treatment priorities   Eval        Gentle ROM        TDN/Manual        Gentle strengthening                          ASSESSMENT:  CLINICAL IMPRESSION: Elaine Marquez is a 52 y.o. female who presents to clinic with signs and sxs consistent with chronic neck pain.  No signs of significant neural impingement on exam.  Consistent with degenerative changes.  Pt will benefit from skilled PT to address pain and relevant deficits to improve function in daily tasks.  There is concurrent bil shoulder pain as well consistent with SAPS.  OBJECTIVE IMPAIRMENTS: Pain, cervical ROM,   ACTIVITY LIMITATIONS: reading, driving, housework, lifting  PERSONAL FACTORS: See medical history and pertinent history   REHAB POTENTIAL: Good  CLINICAL DECISION MAKING: Stable/uncomplicated  EVALUATION COMPLEXITY: Low   GOALS:   SHORT TERM GOALS: Target date: 11/30/2023   [name] will be >75% HEP compliant to improve carryover between sessions and facilitate independent management of condition  Evaluation: ongoing Goal status: INITIAL   LONG TERM GOALS: Target date:  12/28/2023   [name] will improve FOTO score to 52 as a proxy for functional improvement  Evaluation/Baseline: 37 Goal status: INITIAL    2.  [name] will self report >/= 50% decrease in pain from evaluation to improve function in daily tasks  Evaluation/Baseline: 9/10 max pain Goal status: INITIAL   3.  [name] will report confidence in self management of condition at time of discharge with advanced HEP  Evaluation/Baseline: unable to self manage Goal status: INITIAL   4.  Kirti will self report >/= 50% improvement in neck mobility from evaluation to improve function in daily tasks  Evaluation/Baseline: significant limitation and pain in all planes Goal status: INITIAL   PLAN: PT FREQUENCY: 1-2x/week  PT DURATION:  8 weeks  PLANNED INTERVENTIONS:  97164- PT Re-evaluation, 97110-Therapeutic exercises, 97530- Therapeutic activity, [id number]- Neuromuscular re-education, 97535- Self Care, [id number]- Manual therapy, [id number]- Gait training, [phone]- Aquatic Therapy, [phone]- Electrical stimulation (manual), [id number]- Vasopneumatic device, [id number]- Traction (mechanical), [id number]- Ionotophoresis 4mg /ml Dexamethasone, Taping, Dry Needling, Joint manipulation, and Spinal manipulation.   Alphonzo Severance PT, DPT 11/02/2023, 11:24 AM

## 2023-11-21 ENCOUNTER — Ambulatory Visit: Payer: Medicaid Other | Admitting: Physical Therapy

## 2023-11-27 DIAGNOSIS — Z6841 Body Mass Index (BMI) 40.0 and over, adult: Secondary | ICD-10-CM | POA: Diagnosis not present

## 2023-11-27 DIAGNOSIS — M4802 Spinal stenosis, cervical region: Secondary | ICD-10-CM | POA: Diagnosis not present

## 2023-11-28 ENCOUNTER — Ambulatory Visit: Payer: Medicaid Other | Attending: Neurosurgery | Admitting: Physical Therapy

## 2023-11-28 ENCOUNTER — Encounter: Payer: Self-pay | Admitting: Physical Therapy

## 2023-11-28 DIAGNOSIS — M542 Cervicalgia: Secondary | ICD-10-CM | POA: Insufficient documentation

## 2023-11-28 DIAGNOSIS — M6281 Muscle weakness (generalized): Secondary | ICD-10-CM | POA: Diagnosis not present

## 2023-11-28 NOTE — Therapy (Signed)
Daily Note   Patient Name: Elaine Marquez MRN: [id number] DOB:[birth year], 52 y.o., female Today's Date: 11/28/2023   PT End of Session - 11/28/23 1215     Visit Number 2    Number of Visits --   1-2x/week   Date for PT Re-Evaluation 12/28/23    Authorization Type [employer] - FOTO    Authorization Time Period Approved 9 visits 11/19/23-01/17/24    PT Start Time 1215    PT Stop Time 1256    PT Time Calculation (min) 41 min             Past Medical History:  Diagnosis Date   Asthma    Diabetes mellitus without complication (HCC)    Hypertension    Peritonsillar abscess    Past Surgical History:  Procedure Laterality Date   TUBAL LIGATION     Patient Active Problem List   Diagnosis Date Noted   Class 3 severe obesity due to excess calories with serious comorbidity and body mass index (BMI) of 40.0 to 44.9 in adult [facility]) 09/20/2021   Spondylosis without myelopathy or radiculopathy, cervical region 02/12/2019   Type 2 diabetes mellitus without complication, without long-term current use of insulin (HCC) 05/15/2018    PCP: Marcine Matar, MD  REFERRING PROVIDER: Marcine Matar, MD  THERAPY DIAG:  Cervicalgia  Muscle weakness  REFERRING DIAG: Spinal stenosis, cervical region [M48.02]   Rationale for Evaluation and Treatment:  Rehabilitation  SUBJECTIVE:  PERTINENT PAST HISTORY:  DMII      PRECAUTIONS: None  WEIGHT BEARING RESTRICTIONS No  FALLS:  Has patient fallen in last 6 months? No, Number of falls: 0  MOI/History of condition:  Onset date: >2 years  SUBJECTIVE STATEMENT  Pt reports that her pain level is about the same.  She rates her pain 8/10.  Pt feels that the HEP makes it worse.   Red flags:  denies   Pain:  Are you having pain? Yes Pain location: neck to UT region NPRS scale:  6/10 to 9/10 Aggravating factors: any shoulder or neck motion, sleep Relieving factors: medication, hot shower Pain description:  constant, sharp, and aching Stage: Chronic 24 hour pattern: worse first thing in morning and with activity   Occupation: NA  Assistive Device: NA  Hand Dominance: R  Patient Goals/Specific Activities: reduce pain   OBJECTIVE:   DIAGNOSTIC FINDINGS:  IMPRESSION: 1. At C3-4 there is a moderate broad disc osteophyte complex contacting the ventral cervical spinal cord. Severe left foraminal stenosis. Moderate right foraminal stenosis. Mild spinal stenosis. 2. At C4-5 there is a mild disc osteophyte complex with a more focal broad right paracentral component flattening the ventral right paracentral cervical spinal cord. Moderate bilateral foraminal stenosis. Mild central canal stenosis. 3. At C5-6 there is a mild disc osteophyte complex. Moderate left and mild right foraminal stenosis. Mild spinal stenosis. 4. No acute osseous injury of the cervical spine.  GENERAL OBSERVATION:  Forward head rounded shoulders     SENSATION:  Light touch: Appears intact   PALPATION: Significant TTP sub  Cervical ROM  ROM ROM  (Eval)  Flexion 20* paraspinals  Extension 20*  Right lateral flexion 20* UT  Left lateral flexion 20* UT  Right rotation 30*  Left rotation 40*  Flexion rotation (normal is 30 degrees)   Flexion rotation (normal is 30 degrees)     (Blank rows = not tested, N = WNL, * = concordant pain)  UPPER EXTREMITY MMT:  MMT Right (Eval)  Left (Eval)  Shoulder flexion 4* 4*  Shoulder abduction (C5)    Shoulder ER 4* 4*  Shoulder IR    Middle trapezius 3* shoulder  2+* shoulder  Lower trapezius    Shoulder extension    Grip strength    Shoulder shrug (C4)    Elbow flexion (C6)    Elbow ext (C7)    Thumb ext (C8)    Finger abd (T1)    Grossly     (Blank rows = not tested, score listed is out of 5 possible points.  N = WNL, D = diminished, C = clear for gross weakness with myotome testing, * = concordant pain with testing)   JOINT MOBILITY TESTING:   Hypomobile cervical spine  PATIENT SURVEYS:  FOTO 37 -> 52    TODAY'S TREATMENT:   Manual therapy: Skilled palpation to identify trigger points for TDN STM to all listed muscles following TDN STM bil UT and bil LS STM cervical paraspinals Sub occipital release  Trigger Point Dry-Needling  Treatment instructions: Expect mild to moderate muscle soreness. S/S of pneumothorax if dry needled over a lung field, and to seek immediate medical attention should they occur. Patient verbalized understanding of these instructions and education.  Patient Consent Given: Yes Education handout provided: No Muscles treated: bil UT, bil sub occipitals Electrical stimulation performed: No Parameters: N/A Treatment response/outcome: twitch    HOME EXERCISE PROGRAM: Access Code: [email or web address] URL: [email or web address] Date: 11/02/2023 Prepared by: Alphonzo Severance  Exercises - Supine Cervical Retraction with Towel  - 2 x daily - 7 x weekly - 2 sets - 10 reps - 5 second hold - Seated Upper Trapezius Stretch  - 2-3 x daily - 7 x weekly - 3 reps - 45 second hold - Seated Scapular Retraction  - 1 x daily - 7 x weekly - 2 sets - 10 reps - 5 hold  Treatment priorities   Eval        Gentle ROM        TDN/Manual        Gentle strengthening                          ASSESSMENT:  CLINICAL IMPRESSION: Sherlynn tolerated session well with no adverse reaction.  D/t high pain levels and low activity tolerance we concentrated on pain modulation via TDN and STM.  Pt reports significant improvement in sxs following therapy with a reduction in pain from 8/10 to 5/10.  OBJECTIVE IMPAIRMENTS: Pain, cervical ROM,   ACTIVITY LIMITATIONS: reading, driving, housework, lifting  PERSONAL FACTORS: See medical history and pertinent history   REHAB POTENTIAL: Good  CLINICAL DECISION MAKING: Stable/uncomplicated  EVALUATION COMPLEXITY: Low   GOALS:   SHORT TERM GOALS: Target date:  11/30/2023   Havana will be >75% HEP compliant to improve carryover between sessions and facilitate independent management of condition  Evaluation: ongoing Goal status: INITIAL   LONG TERM GOALS: Target date: 12/28/2023   Sherrian will improve FOTO score to 52 as a proxy for functional improvement  Evaluation/Baseline: 37 Goal status: INITIAL    2.  Jenille will self report >/= 50% decrease in pain from evaluation to improve function in daily tasks  Evaluation/Baseline: 9/10 max pain Goal status: INITIAL   3.  Jadesola will report confidence in self management of condition at time of discharge with advanced HEP  Evaluation/Baseline: unable to self manage Goal status: INITIAL   4.  Harriette will self report >/= 50%  improvement in neck mobility from evaluation to improve function in daily tasks  Evaluation/Baseline: significant limitation and pain in all planes Goal status: INITIAL   PLAN: PT FREQUENCY: 1-2x/week  PT DURATION: 8 weeks  PLANNED INTERVENTIONS:  97164- PT Re-evaluation, 97110-Therapeutic exercises, 97530- Therapeutic activity, [id number]- Neuromuscular re-education, 97535- Self Care, 08657- Manual therapy, [id number]- Gait training, [id number]- Aquatic Therapy, [phone]- Electrical stimulation (manual), [id number]- Vasopneumatic device, [id number]- Traction (mechanical), [id number]- Ionotophoresis 4mg /ml Dexamethasone, Taping, Dry Needling, Joint manipulation, and Spinal manipulation.   Alphonzo Severance PT, DPT 11/28/2023, 1:00 PM

## 2023-12-05 ENCOUNTER — Ambulatory Visit: Payer: Medicaid Other | Admitting: Physical Therapy

## 2023-12-05 ENCOUNTER — Telehealth: Payer: Self-pay | Admitting: Physical Therapy

## 2023-12-05 NOTE — Telephone Encounter (Signed)
Called and informed patient of missed visit and provided reminder of next appt and attendance policy.  

## 2023-12-14 ENCOUNTER — Other Ambulatory Visit: Payer: Self-pay | Admitting: Internal Medicine

## 2023-12-14 DIAGNOSIS — E1159 Type 2 diabetes mellitus with other circulatory complications: Secondary | ICD-10-CM

## 2023-12-26 ENCOUNTER — Encounter: Payer: Self-pay | Admitting: Physical Therapy

## 2023-12-26 ENCOUNTER — Ambulatory Visit: Payer: Medicaid Other | Attending: Neurosurgery | Admitting: Physical Therapy

## 2023-12-26 DIAGNOSIS — M6281 Muscle weakness (generalized): Secondary | ICD-10-CM | POA: Insufficient documentation

## 2023-12-26 DIAGNOSIS — M542 Cervicalgia: Secondary | ICD-10-CM | POA: Diagnosis not present

## 2023-12-26 NOTE — Therapy (Signed)
 Daily Note   Patient Name: Elaine Marquez MRN: [id number] DOB:12/19/1970, 53 y.o., female Today's Date: 12/26/2023   PT End of Session - 12/26/23 1222     Visit Number 3    Number of Visits --   1-2x/week   Date for PT Re-Evaluation 12/28/23    Authorization Type [employer] - FOTO    Authorization Time Period Approved 9 visits 11/19/23-01/17/24    PT Start Time 1222   pt arrived late   PT Stop Time 1300    PT Time Calculation (min) 38 min             Past Medical History:  Diagnosis Date   Asthma    Diabetes mellitus without complication (HCC)    Hypertension    Peritonsillar abscess    Past Surgical History:  Procedure Laterality Date   TUBAL LIGATION     Patient Active Problem List   Diagnosis Date Noted   Class 3 severe obesity due to excess calories with serious comorbidity and body mass index (BMI) of 40.0 to 44.9 in adult [facility]) 09/20/2021   Spondylosis without myelopathy or radiculopathy, cervical region 02/12/2019   Type 2 diabetes mellitus without complication, without long-term current use of insulin  (HCC) 05/15/2018    PCP: Elaine Barnie NOVAK, MD  REFERRING PROVIDER: Louis Shove, MD  THERAPY DIAG:  Cervicalgia  Muscle weakness  REFERRING DIAG: Spinal stenosis, cervical region [M48.02]   Rationale for Evaluation and Treatment:  Rehabilitation  SUBJECTIVE:  PERTINENT PAST HISTORY:  DMII      PRECAUTIONS: None  WEIGHT BEARING RESTRICTIONS No  FALLS:  Has patient fallen in last 6 months? No, Number of falls: 0  MOI/History of condition:  Onset date: >2 years  SUBJECTIVE STATEMENT  Pt reports that she continues to have significant neck pain.  She currently rates her pain 8/10.   Red flags:  denies   Pain:  Are you having pain? Yes Pain location: neck to UT region NPRS scale:  6/10 to 9/10 Aggravating factors: any shoulder or neck motion, sleep Relieving factors: medication, hot shower Pain description: constant,  sharp, and aching Stage: Chronic 24 hour pattern: worse first thing in morning and with activity   Occupation: NA  Assistive Device: NA  Hand Dominance: R  Patient Goals/Specific Activities: reduce pain   OBJECTIVE:   DIAGNOSTIC FINDINGS:  IMPRESSION: 1. At C3-4 there is a moderate broad disc osteophyte complex contacting the ventral cervical spinal cord. Severe left foraminal stenosis. Moderate right foraminal stenosis. Mild spinal stenosis. 2. At C4-5 there is a mild disc osteophyte complex with a more focal broad right paracentral component flattening the ventral right paracentral cervical spinal cord. Moderate bilateral foraminal stenosis. Mild central canal stenosis. 3. At C5-6 there is a mild disc osteophyte complex. Moderate left and mild right foraminal stenosis. Mild spinal stenosis. 4. No acute osseous injury of the cervical spine.  GENERAL OBSERVATION:  Forward head rounded shoulders     SENSATION:  Light touch: Appears intact   PALPATION: Significant TTP sub  Cervical ROM  ROM ROM  (Eval)  Flexion 20* paraspinals  Extension 20*  Right lateral flexion 20* UT  Left lateral flexion 20* UT  Right rotation 30*  Left rotation 40*  Flexion rotation (normal is 30 degrees)   Flexion rotation (normal is 30 degrees)     (Blank rows = not tested, N = WNL, * = concordant pain)  UPPER EXTREMITY MMT:  MMT Right (Eval) Left (Eval)  Shoulder flexion  4* 4*  Shoulder abduction (C5)    Shoulder ER 4* 4*  Shoulder IR    Middle trapezius 3* shoulder  2+* shoulder  Lower trapezius    Shoulder extension    Grip strength    Shoulder shrug (C4)    Elbow flexion (C6)    Elbow ext (C7)    Thumb ext (C8)    Finger abd (T1)    Grossly     (Blank rows = not tested, score listed is out of 5 possible points.  N = WNL, D = diminished, C = clear for gross weakness with myotome testing, * = concordant pain with testing)   JOINT MOBILITY TESTING:  Hypomobile  cervical spine  PATIENT SURVEYS:  FOTO 37 -> 52    TODAY'S TREATMENT:   Manual therapy: STM bil UT and bil LS STM cervical paraspinals Sub occipital release   HOME EXERCISE PROGRAM: Access Code: [email or web address] URL: [email or web address] Date: 11/02/2023 Prepared by: Helene Gasmen  Exercises - Supine Cervical Retraction with Towel  - 2 x daily - 7 x weekly - 2 sets - 10 reps - 5 second hold - Seated Upper Trapezius Stretch  - 2-3 x daily - 7 x weekly - 3 reps - 45 second hold - Seated Scapular Retraction  - 1 x daily - 7 x weekly - 2 sets - 10 reps - 5 hold  Treatment priorities   Eval        Gentle ROM        TDN/Manual        Gentle strengthening                          ASSESSMENT:  CLINICAL IMPRESSION: [name] tolerated session well with no adverse reaction.  Pt reports no significant benefit from TDN and requests we skip this today.  We proceeded with manual therapy which she reports significantly reduces her pain.  She will follow up with MD for possible injections.  OBJECTIVE IMPAIRMENTS: Pain, cervical ROM,   ACTIVITY LIMITATIONS: reading, driving, housework, lifting  PERSONAL FACTORS: See medical history and pertinent history   REHAB POTENTIAL: Good  CLINICAL DECISION MAKING: Stable/uncomplicated  EVALUATION COMPLEXITY: Low   GOALS:   SHORT TERM GOALS: Target date: 11/30/2023   [name] will be >75% HEP compliant to improve carryover between sessions and facilitate independent management of condition  Evaluation: ongoing Goal status: ongoing   LONG TERM GOALS: Target date: 12/28/2023   [name] will improve FOTO score to 52 as a proxy for functional improvement  Evaluation/Baseline: 37 Goal status: INITIAL    2.  [name] will self report >/= 50% decrease in pain from evaluation to improve function in daily tasks  Evaluation/Baseline: 9/10 max pain Goal status: INITIAL   3.  [name] will report confidence in self  management of condition at time of discharge with advanced HEP  Evaluation/Baseline: unable to self manage Goal status: INITIAL   4.  [name] will self report >/= 50% improvement in neck mobility from evaluation to improve function in daily tasks  Evaluation/Baseline: significant limitation and pain in all planes Goal status: INITIAL   PLAN: PT FREQUENCY: 1-2x/week  PT DURATION: 8 weeks  PLANNED INTERVENTIONS:  97164- PT Re-evaluation, 97110-Therapeutic exercises, 97530- Therapeutic activity, [id number]- Neuromuscular re-education, 97535- Self Care, 02859- Manual therapy, [id number]- Gait training, [id number]- Aquatic Therapy, [id number]- Electrical stimulation (manual), [id number]- Vasopneumatic device, [id number]- Traction (mechanical), [id number]- Ionotophoresis 4mg /ml Dexamethasone , Taping, Dry Needling, Joint manipulation, and Spinal manipulation.  Mukhtar Shams PT, DPT 12/26/2023, 3:02 PM

## 2024-01-10 DIAGNOSIS — M47812 Spondylosis without myelopathy or radiculopathy, cervical region: Secondary | ICD-10-CM | POA: Diagnosis not present

## 2024-01-10 DIAGNOSIS — M4802 Spinal stenosis, cervical region: Secondary | ICD-10-CM | POA: Diagnosis not present

## 2024-01-22 ENCOUNTER — Other Ambulatory Visit: Payer: Self-pay | Admitting: Internal Medicine

## 2024-01-22 DIAGNOSIS — E119 Type 2 diabetes mellitus without complications: Secondary | ICD-10-CM

## 2024-01-22 DIAGNOSIS — E1159 Type 2 diabetes mellitus with other circulatory complications: Secondary | ICD-10-CM

## 2024-01-22 NOTE — Telephone Encounter (Signed)
 Copied from CRM [id number]. Topic: Clinical - Medication Refill >> Jan 22, 2024 11:44 AM Powell HERO wrote: Most Recent Primary Care Visit:  Provider: VICCI SOBER B  Department: [facility]  Visit Type: OFFICE VISIT  Date: 09/06/2023  Medication: amLODipine  (NORVASC ) 5 MG, Continuous Glucose Receiver (FREESTYLE LIBRE 3 READER) DEVI (NEEDS DEVICE ONLY)  Has the patient contacted their pharmacy? Yes Call office  Is this the correct pharmacy for this prescription? Yes If no, delete pharmacy and type the correct one.  This is the patient's preferred pharmacy:  [facility] - [place], [place] - [address] [place] [address] Phone: [phone] Fax: [phone]   Has the prescription been filled recently? No  Is the patient out of the medication? Yes, has been out for a few days  Has the patient been seen for an appointment in the last year OR does the patient have an upcoming appointment? No  Can we respond through MyChart? Phone call please.  Agent: Please be advised that Rx refills may take up to 3 business days. We ask that you follow-up with your pharmacy.

## 2024-01-22 NOTE — Telephone Encounter (Signed)
Last Fill: Amlodipine: 12/14/23     Libre 3 Reader: 09/07/23  Last OV: 09/06/23 Next OV: None Scheduled  Routing to provider for review/authorization.

## 2024-01-23 MED ORDER — AMLODIPINE BESYLATE 5 MG PO TABS: 5.0000 mg | ORAL_TABLET | Freq: Every day | ORAL | 0 refills | Status: DC

## 2024-01-23 MED ORDER — FREESTYLE LIBRE 3 READER DEVI: 0 refills | Status: AC

## 2024-01-28 ENCOUNTER — Other Ambulatory Visit: Payer: Self-pay | Admitting: Internal Medicine

## 2024-01-28 DIAGNOSIS — G8929 Other chronic pain: Secondary | ICD-10-CM

## 2024-01-28 DIAGNOSIS — I152 Hypertension secondary to endocrine disorders: Secondary | ICD-10-CM

## 2024-01-31 DIAGNOSIS — M47812 Spondylosis without myelopathy or radiculopathy, cervical region: Secondary | ICD-10-CM | POA: Diagnosis not present

## 2024-02-08 ENCOUNTER — Other Ambulatory Visit: Payer: Self-pay | Admitting: Internal Medicine

## 2024-02-08 ENCOUNTER — Other Ambulatory Visit: Payer: Self-pay

## 2024-02-08 DIAGNOSIS — E1159 Type 2 diabetes mellitus with other circulatory complications: Secondary | ICD-10-CM

## 2024-02-11 DIAGNOSIS — M47812 Spondylosis without myelopathy or radiculopathy, cervical region: Secondary | ICD-10-CM | POA: Diagnosis not present

## 2024-02-19 DIAGNOSIS — M47812 Spondylosis without myelopathy or radiculopathy, cervical region: Secondary | ICD-10-CM | POA: Diagnosis not present

## 2024-02-19 DIAGNOSIS — M4802 Spinal stenosis, cervical region: Secondary | ICD-10-CM | POA: Diagnosis not present

## 2024-02-22 ENCOUNTER — Other Ambulatory Visit: Payer: Self-pay | Admitting: Internal Medicine

## 2024-02-22 DIAGNOSIS — E1169 Type 2 diabetes mellitus with other specified complication: Secondary | ICD-10-CM

## 2024-03-11 ENCOUNTER — Ambulatory Visit: Payer: Medicaid Other | Attending: Internal Medicine | Admitting: Internal Medicine

## 2024-03-11 ENCOUNTER — Encounter: Payer: Self-pay | Admitting: Internal Medicine

## 2024-03-11 VITALS — BP 129/87 | HR 109 | Temp 98.9°F | Ht 64.0 in | Wt 262.0 lb

## 2024-03-11 DIAGNOSIS — G8929 Other chronic pain: Secondary | ICD-10-CM | POA: Diagnosis not present

## 2024-03-11 DIAGNOSIS — M1711 Unilateral primary osteoarthritis, right knee: Secondary | ICD-10-CM | POA: Diagnosis not present

## 2024-03-11 DIAGNOSIS — Z23 Encounter for immunization: Secondary | ICD-10-CM

## 2024-03-11 DIAGNOSIS — Z794 Long term (current) use of insulin: Secondary | ICD-10-CM

## 2024-03-11 DIAGNOSIS — E119 Type 2 diabetes mellitus without complications: Secondary | ICD-10-CM

## 2024-03-11 DIAGNOSIS — E1159 Type 2 diabetes mellitus with other circulatory complications: Secondary | ICD-10-CM

## 2024-03-11 DIAGNOSIS — Z1231 Encounter for screening mammogram for malignant neoplasm of breast: Secondary | ICD-10-CM

## 2024-03-11 DIAGNOSIS — I10 Essential (primary) hypertension: Secondary | ICD-10-CM

## 2024-03-11 DIAGNOSIS — E1142 Type 2 diabetes mellitus with diabetic polyneuropathy: Secondary | ICD-10-CM

## 2024-03-11 DIAGNOSIS — M542 Cervicalgia: Secondary | ICD-10-CM

## 2024-03-11 DIAGNOSIS — Z7984 Long term (current) use of oral hypoglycemic drugs: Secondary | ICD-10-CM | POA: Diagnosis not present

## 2024-03-11 DIAGNOSIS — Z7985 Long-term (current) use of injectable non-insulin antidiabetic drugs: Secondary | ICD-10-CM

## 2024-03-11 LAB — POCT GLYCOSYLATED HEMOGLOBIN (HGB A1C): HbA1c, POC (controlled diabetic range): 9.2 % — AB (ref 0.0–7.0)

## 2024-03-11 LAB — GLUCOSE, POCT (MANUAL RESULT ENTRY): POC Glucose: 216 mg/dL — AB (ref 70–99)

## 2024-03-11 MED ORDER — SEMAGLUTIDE(0.25 OR 0.5MG/DOS) 2 MG/3ML ~~LOC~~ SOPN: 0.5000 mg | PEN_INJECTOR | SUBCUTANEOUS | 1 refills | Status: DC

## 2024-03-11 MED ORDER — GABAPENTIN 300 MG PO CAPS: 300.0000 mg | ORAL_CAPSULE | Freq: Every day | ORAL | 1 refills | Status: DC

## 2024-03-11 MED ORDER — METHOCARBAMOL 500 MG PO TABS: 500.0000 mg | ORAL_TABLET | Freq: Two times a day (BID) | ORAL | 1 refills | Status: DC | PRN

## 2024-03-11 MED ORDER — TRAMADOL HCL 50 MG PO TABS
50.0000 mg | ORAL_TABLET | Freq: Every day | ORAL | 0 refills | Status: DC | PRN
Start: 2024-03-11 — End: 2024-08-14

## 2024-03-11 MED ORDER — BASAGLAR KWIKPEN 100 UNIT/ML ~~LOC~~ SOPN: 23.0000 [IU] | PEN_INJECTOR | Freq: Every day | SUBCUTANEOUS | 6 refills | Status: DC

## 2024-03-11 MED ORDER — AMLODIPINE BESYLATE 10 MG PO TABS: 10.0000 mg | ORAL_TABLET | Freq: Every day | ORAL | 1 refills | Status: DC

## 2024-03-11 NOTE — Progress Notes (Unsigned)
 Patient ID: Elaine Marquez, female    DOB: [birth year]  MRN: [id number]  CC: Diabetes (DM f/u. Med refill. /No questions / concerns/Yes to flu & pneumonia vax)   Subjective: Elaine Marquez is a 53 y.o. female who presents for chronic ds management. 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  History of Present Illness   Elaine Marquez is a 53 year old female with diabetes who presents for follow-up of her diabetes management.  DM: Results for orders placed or performed in visit on 03/11/24  POCT glucose (manual entry)   Collection Time: 03/11/24  2:58 PM  Result Value Ref Range   POC Glucose 216 (A) 70 - 99 mg/dl  POCT glycosylated hemoglobin (Hb A1C)   Collection Time: 03/11/24  3:15 PM  Result Value Ref Range   Hemoglobin A1C     HbA1c POC (<> result, manual entry)     HbA1c, POC (prediabetic range)     HbA1c, POC (controlled diabetic range) 9.2 (A) 0.0 - 7.0 %  CBC   Collection Time: 03/11/24  3:54 PM  Result Value Ref Range   WBC 11.3 (H) 3.4 - 10.8 x10E3/uL   RBC 4.75 3.77 - 5.28 x10E6/uL   Hemoglobin 14.4 11.1 - 15.9 g/dL   Hematocrit 40.9 81.1 - 46.6 %   MCV 94 79 - 97 fL   MCH 30.3 26.6 - 33.0 pg   MCHC 32.4 31.5 - 35.7 g/dL   RDW 91.4 78.2 - 95.6 %   Platelets 408 150 - 450 x10E3/uL  Comprehensive metabolic panel   Collection Time: 03/11/24  3:54 PM  Result Value Ref Range   Glucose 209 (H) 70 - 99 mg/dL   BUN 11 6 - 24 mg/dL   Creatinine, Ser 2.13 0.57 - 1.00 mg/dL   eGFR 086 >57 QI/ONG/2.95   BUN/Creatinine Ratio 19 9 - 23   Sodium 138 134 - 144 mmol/L   Potassium 4.4 3.5 - 5.2 mmol/L   Chloride 102 96 - 106 mmol/L   CO2 24 20 - 29 mmol/L   Calcium 10.3 (H) 8.7 - 10.2 mg/dL   Total  Protein 7.3 6.0 - 8.5 g/dL   Albumin 4.6 3.8 - 4.9 g/dL   Globulin, Total 2.7 1.5 - 4.5 g/dL   Bilirubin Total <2.8 0.0 - 1.2 mg/dL   Alkaline Phosphatase 33 (L) 44 - 121 IU/L   AST 14 0 - 40 IU/L   ALT 18 0 - 32 IU/L  Lipid panel   Collection Time: 03/11/24  3:54 PM  Result Value Ref Range   Cholesterol, Total 134 100 - 199 mg/dL   Triglycerides 413 0 - 149 mg/dL   HDL 74 >24 mg/dL   VLDL Cholesterol Cal 20 5 - 40 mg/dL   LDL Chol Calc (NIH) 40 0 - 99 mg/dL   Chol/HDL Ratio 1.8 0.0 - 4.4 ratio  Microalbumin / creatinine urine ratio   Collection Time: 03/11/24  3:54 PM  Result Value Ref Range   Creatinine, Urine WILL FOLLOW    Microalbumin, Urine WILL FOLLOW    Microalb/Creat Ratio WILL FOLLOW    She was last seen in September of the previous year with an A1c of 14.6, which has since decreased to 9.2. She is  on insulin glargine at 20 units daily, metformin, 1000 mg in the morning and 500 mg in the evening and Trulicity at 3 mg once a week. She adheres to her medication regimen and notes a decrease in appetite for the first two days after taking Trulicity. Despite this, she has gained weight, currently weighing 262 pounds, up from 258 pounds at her last visit.  -Would like to be changed to Ozempic to see if she would have greater success with weight loss.  Sister is on Ozempic and doing well. She experiences numbness and tingling in her toes, particularly the second and third toes on both feet, ongoing for about three months. She describes the sensation as throbbing and uncomfortable, noting that it is not constant but often correlates with higher blood sugar levels.  She uses a continuous glucose monitor and reports morning blood sugars around 180 mg/dL, though she notes discrepancies between the monitor and fingerstick readings. Over the past week, her blood sugars have been in the target range 17% of the time, with 58% of readings between 180 and 250 mg/dL.  Chronic neck pain/OA knee:  She has chronic neck pain for which she has received injections from the neurosurgeon Dr. Dutch Quint since last visit.  Injections providing temporary relief. She has been referred for further treatment involving nerve burning.  She is contemplating whether to have this done.  She has been taking tramadol for pain management and has not had Celebrex for two to three months.  HTN: She is currently on amlodipine and losartan for blood pressure management.  Takes medications consistently.  She limits salt in the foods.    HM: Due for mammogram.  Agreeable to having flu shot and first Shingrix vaccine today. Patient Active Problem List   Diagnosis Date Noted   Class 3 severe obesity due to excess calories with serious comorbidity and body mass index (BMI) of 40.0 to 44.9 in adult [facility]) 09/20/2021   Spondylosis without myelopathy or radiculopathy, cervical region 02/12/2019   Type 2 diabetes mellitus without complication, without long-term current use of insulin (HCC) 05/15/2018     Current Outpatient Medications on File Prior to Visit  Medication Sig Dispense Refill   Accu-Chek Softclix Lancets lancets USE TO CHECK BLOOD SUGAR THREE TIMES DAILY AS DIRECTED 100 each 0   albuterol (VENTOLIN HFA) 108 (90 Base) MCG/ACT inhaler Inhale 2 puffs into the lungs every 6 (six) hours as needed for wheezing or shortness of breath. 6.7 g 0   Blood Glucose Monitoring Suppl (ACCU-CHEK GUIDE) w/Device KIT Check blood sugars 3 times a day before meals. 1 kit 0   celecoxib (CELEBREX) 100 MG capsule TAKE ONE CAPSULE BY MOUTH TWICE DAILY 60 capsule 0   Continuous Glucose Receiver (FREESTYLE LIBRE 3 READER) DEVI Use to check glucose continuously. 1 each 0   Continuous Glucose Sensor (FREESTYLE LIBRE 3 SENSOR) MISC Place 1 sensor on the skin every 14 days. Use to check glucose continuously 2 each 6   glucose blood (ACCU-CHEK GUIDE) test strip USE TO check blood sugar three times daily 100 strip 11   hydrocortisone 2.5 % cream  Apply once daily to affected area x 5 days. 30 g 0   Insulin Pen Needle (LITETOUCH PEN NEEDLES) 31G X 6 MM MISC USE TO administer insulin EVERY DAY 100 each 0   Lancets Misc. (ACCU-CHEK FASTCLIX LANCET) KIT Check blood glucose at least 3 times daily. 1 kit 6   losartan (COZAAR) 25 MG tablet Take 1 tablet (25 mg  total) by mouth daily. 90 tablet 1   metFORMIN (GLUCOPHAGE-XR) 500 MG 24 hr tablet TAKE ONE TABLET BY MOUTH EVERY MORNING AND TAKE TWO TABLETS BY MOUTH EVERY IN THE EVENING 90 tablet 6   albuterol (PROVENTIL) (2.5 MG/3ML) 0.083% nebulizer solution Take 3 mLs (2.5 mg total) by nebulization every 6 (six) hours as needed for wheezing. (Patient not taking: Reported on 09/06/2023) 75 mL 0   diclofenac Sodium (VOLTAREN) 1 % GEL APPLY TWO grams topically FOUR TIMES DAILY (Patient not taking: Reported on 03/11/2024) 100 g 0   DULERA 100-5 MCG/ACT AERO Inhale 2 puffs into the lungs 2 (two) times daily. (Patient not taking: Reported on 03/11/2024) 1 each 6   No current facility-administered medications on file prior to visit.    Allergies  Allergen Reactions   Ibuprofen Nausea And Vomiting    Social History   Socioeconomic History   Marital status: Married    Spouse name: Not on file   Number of children: Not on file   Years of education: Not on file   Highest education level: Not on file  Occupational History   Not on file  Tobacco Use   Smoking status: Never   Smokeless tobacco: Never  Substance and Sexual Activity   Alcohol use: Yes    Comment: occasionally    Drug use: No   Sexual activity: Not on file  Other Topics Concern   Not on file  Social History Narrative   Not on file   Social Drivers of Health   Financial Resource Strain: Low Risk  (03/11/2024)   Overall Financial Resource Strain (CARDIA)    Difficulty of Paying Living Expenses: Not very hard  Food Insecurity: No Food Insecurity (03/11/2024)   Hunger Vital Sign    Worried About Running Out of Food in the Last  Year: Never true    Ran Out of Food in the Last Year: Never true  Transportation Needs: No Transportation Needs (03/11/2024)   PRAPARE - Administrator, Civil Service (Medical): No    Lack of Transportation (Non-Medical): No  Physical Activity: Insufficiently Active (03/11/2024)   Exercise Vital Sign    Days of Exercise per Week: 1 day    Minutes of Exercise per Session: 30 min  Stress: No Stress Concern Present (03/11/2024)   [employer] of Occupational Health - Occupational Stress Questionnaire    Feeling of Stress : Not at all  Social Connections: Moderately Isolated (03/11/2024)   Social Connection and Isolation Panel [NHANES]    Frequency of Communication with Friends and Family: More than three times a week    Frequency of Social Gatherings with Friends and Family: Once a week    Attends Religious Services: Never    Database administrator or Organizations: No    Attends Banker Meetings: Never    Marital Status: Married  Catering manager Violence: Not At Risk (03/11/2024)   Humiliation, Afraid, Rape, and Kick questionnaire    Fear of Current or Ex-Partner: No    Emotionally Abused: No    Physically Abused: No    Sexually Abused: No    Family History  Problem Relation Age of Onset   Hypertension Mother    Diabetes Mother    Hypertension Father    Diabetes Father    CVA Brother     Past Surgical History:  Procedure Laterality Date   TUBAL LIGATION      ROS: Review of Systems Negative except as stated above  PHYSICAL EXAM: BP 129/87 (BP Location: Left Arm, Patient Position: Sitting, Cuff Size: Large)   Pulse (!) 109   Temp 98.9 F (37.2 C) (Oral)   Ht 5\' 4"  (1.626 m)   Wt 262 lb (118.8 kg)   SpO2 95%   BMI 44.97 kg/m   Wt Readings from Last 3 Encounters:  03/11/24 262 lb (118.8 kg)  09/06/23 258 lb (117 kg)  12/14/22 259 lb 6.4 oz (117.7 kg)    Physical Exam  General appearance - alert, well appearing, middle age AAF and in  no distress Mental status - normal mood, behavior, speech, dress, motor activity, and thought processes Neck - supple, no significant adenopathy Chest - clear to auscultation, no wheezes, rales or rhonchi, symmetric air entry Heart - normal rate, regular rhythm, normal S1, S2, no murmurs, rubs, clicks or gallops Extremities - peripheral pulses normal, no pedal edema, no clubbing or cyanosis Diabetic Foot Exam - Simple   Simple Foot Form Diabetic Foot exam was performed with the following findings: Yes 03/11/2024  6:27 PM  Visual Inspection No deformities, no ulcerations, no other skin breakdown bilaterally: Yes Sensation Testing Intact to touch and monofilament testing bilaterally: Yes Pulse Check Posterior Tibialis and Dorsalis pulse intact bilaterally: Yes Comments         Latest Ref Rng & Units 03/11/2024    3:54 PM 09/20/2021    4:17 PM 12/24/2019    3:15 PM  CMP  Glucose 70 - 99 mg/dL 161  096  045   BUN 6 - 24 mg/dL 11  7  13    Creatinine 0.57 - 1.00 mg/dL 4.09  8.11  9.14   Sodium 134 - 144 mmol/L 138  139  137   Potassium 3.5 - 5.2 mmol/L 4.4  4.7  4.8   Chloride 96 - 106 mmol/L 102  100  98   CO2 20 - 29 mmol/L 24  22  23    Calcium 8.7 - 10.2 mg/dL 78.2  95.6  21.3   Total Protein 6.0 - 8.5 g/dL 7.3  7.3  7.4   Total Bilirubin 0.0 - 1.2 mg/dL <0.8  0.3  0.3   Alkaline Phos 44 - 121 IU/L 33  30  33   AST 0 - 40 IU/L 14  22  10    ALT 0 - 32 IU/L 18  27  12     Lipid Panel     Component Value Date/Time   CHOL 134 03/11/2024 1554   TRIG 117 03/11/2024 1554   HDL 74 03/11/2024 1554   CHOLHDL 1.8 03/11/2024 1554   LDLCALC 40 03/11/2024 1554    CBC    Component Value Date/Time   WBC 11.3 (H) 03/11/2024 1554   WBC 9.0 05/15/2018 0843   RBC 4.75 03/11/2024 1554   RBC 4.62 05/15/2018 0843   HGB 14.4 03/11/2024 1554   HCT 44.4 03/11/2024 1554   PLT 408 03/11/2024 1554   MCV 94 03/11/2024 1554   MCH 30.3 03/11/2024 1554   MCH 29.9 05/15/2018 0843   MCHC 32.4  03/11/2024 1554   MCHC 33.5 05/15/2018 0843   RDW 13.3 03/11/2024 1554   LYMPHSABS 3.3 (H) 12/24/2019 1515   EOSABS 0.2 12/24/2019 1515   BASOSABS 0.1 12/24/2019 1515    ASSESSMENT AND PLAN: 1. Type 2 diabetes mellitus with diabetic polyneuropathy, with long-term current use of insulin (HCC) (Primary) A1c not at goal but improved. Recommend increasing glargine insulin to 23 units daily. We will try changing her to [facility]  but looks like the Trulicity is the preferred for her insurance.  I will see if our pharmacy tech can submit for prior approval given that she has not had much weight loss with the Trulicity.  Advised that the Trulicity works about the same way as Ozempic with similar side effects.  If she is approved, I have written on the prescription for the pharmacist to show her how to administer the medication.  We will start her on the 0.5 mg dose since she has been on high dose - POCT glycosylated hemoglobin (Hb A1C) - POCT glucose (manual entry) - Insulin Glargine (BASAGLAR KWIKPEN) 100 UNIT/ML; Inject 23 Units into the skin daily.  Dispense: 15 mL; Refill: 6 - gabapentin (NEURONTIN) 300 MG capsule; Take 1 capsule (300 mg total) by mouth at bedtime.  Dispense: 30 capsule; Refill: 1 - Semaglutide,0.25 or 0.5MG /DOS, 2 MG/3ML SOPN; Inject 0.5 mg into the skin once a week. Stop Trulicity  Dispense: 3 mL; Refill: 1  2. Diabetes mellitus treated with oral medication (HCC) See #1 above - CBC - Comprehensive metabolic panel - Lipid panel - Microalbumin / creatinine urine ratio  3. Long-term (current) use of injectable non-insulin antidiabetic drugs See #1 above  4. Hypertension associated with diabetes (HCC) Blood pressure not at goal.  Increase amlodipine to 10 mg daily.  Continue Cozaar at 25 mg daily.  5. Chronic neck pain Patient is requesting refill on tramadol.  We discussed getting her on controlled substance prescribing agreement.  She was getting tramadol through Dr.  Ethelene Browns office since last visit with me in September but states she will not be getting it there anymore.  I went over it with her.  Patient agreeable to terms.  My CMA was supposed to print the agreement and give it to her to sign but forgot to do so.  Pt willing to come back to have this done; message sent to CMA.   -random urine drug screen collected. - methocarbamol (ROBAXIN) 500 MG tablet; Take 1 tablet (500 mg total) by mouth 2 (two) times daily as needed for muscle spasms.  Dispense: 180 tablet; Refill: 1 - traMADol (ULTRAM) 50 MG tablet; Take 1 tablet (50 mg total) by mouth daily as needed.  Dispense: 30 tablet; Refill: 0 - [id number] 11+Oxyco+Alc+Crt-Bund  6. Encounter for chronic pain management - [id number] 11+Oxyco+Alc+Crt-Bund  7. Primary osteoarthritis of right knee - traMADol (ULTRAM) 50 MG tablet; Take 1 tablet (50 mg total) by mouth daily as needed.  Dispense: 30 tablet; Refill: 0 - [id number] 11+Oxyco+Alc+Crt-Bund  8. Need for vaccination against Streptococcus pneumoniae - Pneumococcal conjugate vaccine 20-valent  9. Need for influenza vaccination - Flu vaccine trivalent PF, 6mos and older(Flulaval,Afluria,Fluarix,Fluzone)  10. Encounter for screening mammogram for malignant neoplasm of breast - MM 3D SCREENING MAMMOGRAM BILATERAL BREAST; Future    Patient was given the opportunity to ask questions.  Patient verbalized understanding of the plan and was able to repeat key elements of the plan.   This documentation was completed using Paediatric nurse.  Any transcriptional errors are unintentional.  Orders Placed This Encounter  Procedures   MM 3D SCREENING MAMMOGRAM BILATERAL BREAST   Pneumococcal conjugate vaccine 20-valent   Flu vaccine trivalent PF, 6mos and older(Flulaval,Afluria,Fluarix,Fluzone)   CBC   Comprehensive metabolic panel   Lipid panel   Microalbumin / creatinine urine ratio   [id number] 11+Oxyco+Alc+Crt-Bund   POCT glycosylated hemoglobin (Hb  A1C)   POCT glucose (manual entry)     Requested Prescriptions  Signed Prescriptions Disp Refills   Insulin Glargine (BASAGLAR KWIKPEN) 100 UNIT/ML 15 mL 6    Sig: Inject 23 Units into the skin daily.   methocarbamol (ROBAXIN) 500 MG tablet 180 tablet 1    Sig: Take 1 tablet (500 mg total) by mouth 2 (two) times daily as needed for muscle spasms.   traMADol (ULTRAM) 50 MG tablet 30 tablet 0    Sig: Take 1 tablet (50 mg total) by mouth daily as needed.   gabapentin (NEURONTIN) 300 MG capsule 30 capsule 1    Sig: Take 1 capsule (300 mg total) by mouth at bedtime.   Semaglutide,0.25 or 0.5MG /DOS, 2 MG/3ML SOPN 3 mL 1    Sig: Inject 0.5 mg into the skin once a week. Stop Trulicity   amLODipine (NORVASC) 10 MG tablet 90 tablet 1    Sig: Take 1 tablet (10 mg total) by mouth daily.    Return in about 4 months (around 07/11/2024).  Jonah Blue, MD, FACP

## 2024-03-11 NOTE — Patient Instructions (Signed)
 VISIT SUMMARY:  Today, we reviewed your diabetes management, blood pressure, and chronic neck pain. We also discussed your symptoms of diabetic neuropathy and planned for your general health maintenance.  YOUR PLAN:  -TYPE 2 DIABETES MELLITUS: Type 2 Diabetes Mellitus is a condition where your body does not use insulin properly, leading to high blood sugar levels. Your A1c has improved significantly from 14.6% to 9.2%, but our goal is to get it below 7%. We will increase your insulin glargine to 23 units daily and send a prescription for Ozempic 0.5 mg weekly, while petitioning your insurance for coverage. If Ozempic is not approved, continue with Trulicity 3 mg weekly. Continue taking metformin 1000 mg in the morning and 500 mg in the evening.  -DIABETIC NEUROPATHY: Diabetic Neuropathy is nerve damage caused by high blood sugar levels, leading to numbness and tingling in your toes. We will start you on gabapentin 300 mg at bedtime to help manage these symptoms.  -HYPERTENSION: Hypertension is high blood pressure, which can lead to serious health problems if not managed. Your current blood pressure is 125/85 mmHg. We will recheck your blood pressure and if the diastolic (bottom number) is above 80 mmHg, we will increase your amlodipine to 10 mg.  -CHRONIC NECK PAIN: Chronic Neck Pain is long-term pain in your neck area. You have been managing it with tramadol. We will refill your tramadol prescription, complete a controlled substance prescribing agreement, conduct a random urine drug screen, and discuss nerve burning procedures with a specialist.  -GENERAL HEALTH MAINTENANCE: It is important to keep up with routine health maintenance to monitor and maintain your overall health. We will administer any needed vaccines, order a mammogram, and conduct blood tests to check your cholesterol, kidney, and liver function.  INSTRUCTIONS:  Please follow up with the recommended blood tests, mammogram, and any  needed vaccinations. We will also recheck your blood pressure and adjust your medication as needed. Continue monitoring your blood sugar levels and report any significant changes or concerns.

## 2024-03-12 ENCOUNTER — Telehealth: Payer: Self-pay

## 2024-03-12 ENCOUNTER — Telehealth: Payer: Self-pay | Admitting: Internal Medicine

## 2024-03-12 ENCOUNTER — Other Ambulatory Visit: Payer: Self-pay

## 2024-03-12 MED ORDER — FREESTYLE LIBRE 3 SENSOR MISC: 6 refills | Status: DC

## 2024-03-12 NOTE — Telephone Encounter (Signed)
 I went over control substance prescribing agreement with pt yesterday. However, you forgot to print the document and give it to her to read and sign. Pt willing to come back as nurse only visit to have it done. Please arrange with her.

## 2024-03-12 NOTE — Telephone Encounter (Signed)
 Yes ma'am, rxn sent! Thank you for letting me know!

## 2024-03-12 NOTE — Telephone Encounter (Signed)
 Pharmacy Patient Advocate Encounter  Received notification from [facility] that Prior Authorization for [facility], TRAMADOL, FREESTYLE LIBRE SENSOR has been APPROVED from 03/12/2024 to 03/12/2025,  03/12/2024 TO 09/08/2024, 03/12/2024 TO 03/12/2025   PA #/Case ID/Reference #: [id number], [id number], [id number]  PER DOMINIQUE AT [facility] PATIENT IS AWARE.

## 2024-03-12 NOTE — Telephone Encounter (Signed)
 Copied from CRM [id number]. Topic: Clinical - Prescription Issue >> Mar 12, 2024 10:10 AM Geroge Baseman wrote: Reason for CRM: patient needs a prior auth for her ozempic, she said she needs it ASAP please. Wants to get started on meds.

## 2024-03-12 NOTE — Addendum Note (Signed)
 Addended by: Yehuda Savannah L on: 03/12/2024 04:51 PM   Modules accepted: Orders

## 2024-03-13 ENCOUNTER — Telehealth: Payer: Self-pay | Admitting: Internal Medicine

## 2024-03-13 LAB — COMPREHENSIVE METABOLIC PANEL WITH GFR
ALT: 18 IU/L (ref 0–32)
AST: 14 IU/L (ref 0–40)
Albumin: 4.6 g/dL (ref 3.8–4.9)
Alkaline Phosphatase: 33 IU/L — ABNORMAL LOW (ref 44–121)
BUN/Creatinine Ratio: 19 (ref 9–23)
BUN: 11 mg/dL (ref 6–24)
Bilirubin Total: <0.2 mg/dL (ref 0.0–1.2)
CO2: 24 mmol/L (ref 20–29)
Calcium: 10.3 mg/dL — ABNORMAL HIGH (ref 8.7–10.2)
Chloride: 102 mmol/L (ref 96–106)
Creatinine, Ser: 0.59 mg/dL (ref 0.57–1.00)
Globulin, Total: 2.7 g/dL (ref 1.5–4.5)
Glucose: 209 mg/dL — ABNORMAL HIGH (ref 70–99)
Potassium: 4.4 mmol/L (ref 3.5–5.2)
Sodium: 138 mmol/L (ref 134–144)
Total Protein: 7.3 g/dL (ref 6.0–8.5)
eGFR: 108 mL/min/{1.73_m2} (ref >59)

## 2024-03-13 LAB — LIPID PANEL
Chol/HDL Ratio: 1.8 ratio (ref 0.0–4.4)
Cholesterol, Total: 134 mg/dL (ref 100–199)
HDL: 74 mg/dL (ref >39)
LDL Chol Calc (NIH): 40 mg/dL (ref 0–99)
Triglycerides: 117 mg/dL (ref 0–149)
VLDL Cholesterol Cal: 20 mg/dL (ref 5–40)

## 2024-03-13 LAB — CBC
Hematocrit: 44.4 % (ref 34.0–46.6)
Hemoglobin: 14.4 g/dL (ref 11.1–15.9)
MCH: 30.3 pg (ref 26.6–33.0)
MCHC: 32.4 g/dL (ref 31.5–35.7)
MCV: 94 fL (ref 79–97)
Platelets: 408 10*3/uL (ref 150–450)
RBC: 4.75 x10E6/uL (ref 3.77–5.28)
RDW: 13.3 % (ref 11.7–15.4)
WBC: 11.3 10*3/uL — ABNORMAL HIGH (ref 3.4–10.8)

## 2024-03-13 LAB — MICROALBUMIN / CREATININE URINE RATIO
Creatinine, Urine: 66.4 mg/dL
Microalb/Creat Ratio: 31 mg/g{creat} — ABNORMAL HIGH (ref 0–29)
Microalbumin, Urine: 20.8 ug/mL

## 2024-03-13 NOTE — Telephone Encounter (Signed)
-----   Message from Weldon Picking sent at 03/12/2024  4:52 PM EDT ----- Regarding: RE: Ozempic PA This has been approved and Dominique at [employer] said she spoke to patient over the phone and patient is aware. ----- Message ----- From: Marcine Matar, MD Sent: 03/12/2024   4:39 PM EDT To: Weldon Picking, CPhT Subject: Ozempic PA                                     This pt was on high dose Trulicity for DM and weight loss.  She has had no significant weight lost and wants to change to Ozempic.  Trulicity is preferred by her insurance.  Can you try getting PA on Ozempic for reason stated?

## 2024-03-14 NOTE — Telephone Encounter (Signed)
 Called & spoke to the patient. Verified name & DOB. Informed patient that the control substance agreement document was missed at last visit and will need to come in to sign document. Appointment scheduled for Monday 03/17/2024. Patient confirmed appointment.

## 2024-03-16 ENCOUNTER — Other Ambulatory Visit: Payer: Self-pay | Admitting: Internal Medicine

## 2024-03-16 DIAGNOSIS — G8929 Other chronic pain: Secondary | ICD-10-CM

## 2024-03-16 DIAGNOSIS — M1711 Unilateral primary osteoarthritis, right knee: Secondary | ICD-10-CM

## 2024-03-16 LAB — DRUG SCREEN 764883 11+OXYCO+ALC+CRT-BUND
Amphetamines, Urine: NEGATIVE ng/mL
BENZODIAZ UR QL: NEGATIVE ng/mL
Barbiturate: NEGATIVE ng/mL
Cannabinoid Quant, Ur: NEGATIVE ng/mL
Cocaine (Metabolite): NEGATIVE ng/mL
Creatinine: 71.5 mg/dL (ref 20.0–300.0)
Ethanol: NEGATIVE %
Meperidine: NEGATIVE ng/mL
Methadone Screen, Urine: NEGATIVE ng/mL
OPIATE SCREEN URINE: NEGATIVE ng/mL
Phencyclidine: NEGATIVE ng/mL
Propoxyphene: NEGATIVE ng/mL
pH, Urine: 5.4 (ref 4.5–8.9)

## 2024-03-16 LAB — OXYCODONE/OXYMORPHONE, CONFIRM
OXYCODONE/OXYMORPH: POSITIVE — AB
OXYCODONE: 523 ng/mL
OXYCODONE: POSITIVE — AB
OXYMORPHONE: NEGATIVE

## 2024-03-16 LAB — TRAMADOL GC/MS, URINE
Tramadol gc/ms Conf: 1052 ng/mL
Tramadol: POSITIVE — AB

## 2024-03-17 ENCOUNTER — Ambulatory Visit

## 2024-03-21 ENCOUNTER — Ambulatory Visit

## 2024-03-25 ENCOUNTER — Telehealth: Payer: Self-pay

## 2024-03-25 NOTE — Telephone Encounter (Signed)
 Copied from CRM [id number]. Topic: Clinical - Lab/Test Results >> Mar 25, 2024 10:01 AM [name] wrote: Reason for CRM:  I read the lab results to Elaine Marquez dated 03/16/24. She had no questions and understood that future prescriptions for Tramadol is on hold.

## 2024-03-27 ENCOUNTER — Ambulatory Visit

## 2024-04-07 ENCOUNTER — Other Ambulatory Visit: Payer: Self-pay | Admitting: Internal Medicine

## 2024-04-07 ENCOUNTER — Telehealth: Payer: Self-pay

## 2024-04-07 DIAGNOSIS — I152 Hypertension secondary to endocrine disorders: Secondary | ICD-10-CM

## 2024-04-07 DIAGNOSIS — E1142 Type 2 diabetes mellitus with diabetic polyneuropathy: Secondary | ICD-10-CM

## 2024-04-07 NOTE — Telephone Encounter (Signed)
 Copied from CRM [id number]. Topic: Clinical - Medication Refill >> Apr 07, 2024 11:32 AM Virgil Griffiths wrote: Most Recent Primary Care Visit:  Provider: Concetta Dee B  Department: [facility]  Visit Type: OFFICE VISIT  Date: 03/11/2024  Medication: amLODipine  (NORVASC ) 10 MG tablet  Has the patient contacted their pharmacy? Yes (Agent: If no, request that the patient contact the pharmacy for the refill. If patient does not wish to contact the pharmacy document the reason why and proceed with request.) (Agent: If yes, when and what did the pharmacy advise?)  Is this the correct pharmacy for this prescription? Yes If no, delete pharmacy and type the correct one.  This is the patient's preferred pharmacy:  [facility] - [place], [place] - [address] [place] [address] Phone: [phone] Fax: [phone]   Has the prescription been filled recently? No  Is the patient out of the medication? Yes  Has the patient been seen for an appointment in the last year OR does the patient have an upcoming appointment? Yes  Can we respond through MyChart? Yes  Agent: Please be advised that Rx refills may take up to 3 business days. We ask that you follow-up with your pharmacy.

## 2024-04-07 NOTE — Telephone Encounter (Signed)
 Copied from CRM [id number]. Topic: Clinical - Prescription Issue >> Apr 07, 2024 11:34 AM Lotus Round B wrote: Reason for CRM: pt called in stating that she has been taking OZEMPIC  for a while now and was wondering if D.r Lincoln Renshaw can up the doasge due to pt not seeing any results . So nurse or Dr.johnson can give her a call for this matter .

## 2024-04-08 ENCOUNTER — Other Ambulatory Visit: Payer: Self-pay | Admitting: Internal Medicine

## 2024-04-08 DIAGNOSIS — E1142 Type 2 diabetes mellitus with diabetic polyneuropathy: Secondary | ICD-10-CM

## 2024-04-08 MED ORDER — SEMAGLUTIDE (1 MG/DOSE) 4 MG/3ML ~~LOC~~ SOPN: 1.0000 mg | PEN_INJECTOR | SUBCUTANEOUS | 1 refills | Status: DC

## 2024-04-08 NOTE — Telephone Encounter (Signed)
 Called & spoke to Somers at [facility]. Confirmed medication dosage change.  Called & spoke to the patient. Verified name & DOB. Informed of medication dosage change. Patient expressed verbal understanding.

## 2024-04-08 NOTE — Addendum Note (Signed)
 Addended by: Concetta Dee B on: 04/08/2024 06:38 AM   Modules accepted: Orders

## 2024-04-08 NOTE — Telephone Encounter (Signed)
 Please call [facility] and tell them to disregard the prescription for Ozempic  0.5 mg.  Dose increase to 1 mg once a week.  Updated prescription sent.  Please call patient and let her know.

## 2024-04-10 ENCOUNTER — Ambulatory Visit

## 2024-04-21 ENCOUNTER — Ambulatory Visit

## 2024-04-21 ENCOUNTER — Ambulatory Visit
Admission: RE | Admit: 2024-04-21 | Discharge: 2024-04-21 | Disposition: A | Discharge status: Home / Self Care | Source: Ambulatory Visit | Attending: Internal Medicine | Admitting: Internal Medicine

## 2024-04-21 DIAGNOSIS — Z1231 Encounter for screening mammogram for malignant neoplasm of breast: Secondary | ICD-10-CM | POA: Diagnosis not present

## 2024-04-22 DIAGNOSIS — G894 Chronic pain syndrome: Secondary | ICD-10-CM | POA: Insufficient documentation

## 2024-04-22 DIAGNOSIS — M47812 Spondylosis without myelopathy or radiculopathy, cervical region: Secondary | ICD-10-CM | POA: Insufficient documentation

## 2024-04-22 DIAGNOSIS — Z789 Other specified health status: Secondary | ICD-10-CM | POA: Insufficient documentation

## 2024-04-22 DIAGNOSIS — M899 Disorder of bone, unspecified: Secondary | ICD-10-CM | POA: Insufficient documentation

## 2024-04-22 DIAGNOSIS — M4802 Spinal stenosis, cervical region: Secondary | ICD-10-CM | POA: Insufficient documentation

## 2024-04-22 DIAGNOSIS — M488X9 Other specified spondylopathies, site unspecified: Secondary | ICD-10-CM | POA: Insufficient documentation

## 2024-04-22 DIAGNOSIS — R937 Abnormal findings on diagnostic imaging of other parts of musculoskeletal system: Secondary | ICD-10-CM | POA: Insufficient documentation

## 2024-04-22 DIAGNOSIS — Z79899 Other long term (current) drug therapy: Secondary | ICD-10-CM | POA: Insufficient documentation

## 2024-04-22 NOTE — Patient Instructions (Signed)

## 2024-04-22 NOTE — Progress Notes (Unsigned)
 PROVIDER NOTE: Interpretation of information contained herein should be left to medically-trained personnel. Specific patient instructions are provided elsewhere under "Patient Instructions" section of medical record. This document was created in part using AI and STT-dictation technology, any transcriptional errors that may result from this process are unintentional.  Patient: Elaine Marquez  Service: E/M Encounter  Provider: Candi Chafe, MD  DOB: [birth year]  Delivery: Face-to-face  Specialty: Interventional Pain Management  MRN: [id number]  Setting: Ambulatory outpatient facility  Specialty designation: 09  Type: New Patient  Location: Outpatient office facility  PCP: Lawrance Presume, MD  DOS: 04/23/2024    Referring Prov.: Annis Kinder, MD   Primary Reason(s) for Visit: Encounter for initial evaluation of one or more chronic problems (new to examiner) potentially causing chronic pain, and posing a threat to normal musculoskeletal function. (Level of risk: High) CC: No chief complaint on file.  HPI  Elaine Marquez is a 53 y.o. year old, female patient, who comes for the first time to our practice referred by Annis Kinder, MD for our initial evaluation of her chronic pain. She has Type 2 diabetes mellitus without complication, without long-term current use of insulin  (HCC); Spondylosis without myelopathy or radiculopathy, cervical region; Class 3 severe obesity due to excess calories with serious comorbidity and body mass index (BMI) of 40.0 to 44.9 in adult; Arthropathy of cervical facet joint; Ossification of posterior longitudinal ligament (HCC); Spinal stenosis in cervical region; Chronic pain syndrome; Pharmacologic therapy; Disorder of skeletal system; Problems influencing health status; and Abnormal MRI, cervical spine (10/15/2023) on their problem list. Today she comes in for evaluation of her No chief complaint on file.  Pain Assessment: Location:     Radiating:   Onset:    Duration:   Quality:   Severity:  /10 (subjective, self-reported pain score)  Effect on ADL:   Timing:   Modifying factors:   BP:    HR:    Onset and Duration: {Hx; Onset and Duration:210120511} Cause of pain: {Hx; Cause:210120521} Severity: {Pain Severity:210120502} Timing: {Symptoms; Timing:210120501} Aggravating Factors: {Causes; Aggravating pain factors:210120507} Alleviating Factors: {Causes; Alleviating Factors:210120500} Associated Problems: {Hx; Associated problems:210120515} Quality of Pain: {Hx; Symptom quality or Descriptor:210120531} Previous Examinations or Tests: {Hx; Previous examinations or test:210120529} Previous Treatments: {Hx; Previous Treatment:210120503}  Elaine Marquez is being evaluated for possible interventional pain management therapies for the treatment of her chronic pain.  Discussed the use of AI scribe software for clinical note transcription with the patient, who gave verbal consent to proceed.  History of Present Illness           ***  Elaine Marquez has been informed that this initial visit was an evaluation only.  On the follow up appointment I will go over the results, including ordered tests and available interventional therapies. At that time she will have the opportunity to decide whether to proceed with offered therapies or not. In the event that Elaine Marquez prefers avoiding interventional options, this will conclude our involvement in the case.  Medication management recommendations may be provided upon request.  Patient informed that diagnostic tests may be ordered to assist in identifying underlying causes, narrow the list of differential diagnoses and aid in determining candidacy for (or contraindications to) planned therapeutic interventions.  Historic Controlled Substance Pharmacotherapy Review  PMP and historical list of controlled substances: Tramadol  Hcl 50 Mg Tablet ; Gabapentin  300 Mg Capsule ;Diazepam  5 Mg Tablet  Most recently  prescribed opioid analgesics: *** MME/day: *** mg/day  Historical Monitoring:  The patient  reports no history of drug use. List of prior UDS Testing: Lab Results  Component Value Date   PCPQUANT Negative 03/11/2024   CANNABQUANT Negative 03/11/2024   Historical Background Evaluation: [place] PMP: PDMP reviewed during this encounter. Review of the past 85-months conducted.             PMP NARX Score Report:  Narcotic: 411 Sedative: 180 Stimulant: 000 [place] Department of public safety, offender search: Engineer, mining Information) Non-contributory Risk Assessment Profile: Aberrant behavior: None observed or detected today Risk factors for fatal opioid overdose: None identified today PMP NARX Overdose Risk Score: 360 Fatal overdose hazard ratio (HR): Calculation deferred Non-fatal overdose hazard ratio (HR): Calculation deferred Risk of opioid abuse or dependence: 0.7-3.0% with doses <= 36 MME/day and 6.1-26% with doses >= 120 MME/day. Substance use disorder (SUD) risk level: See below Personal History of Substance Abuse (SUD-Substance use disorder):  Alcohol:    Illegal Drugs:    Rx Drugs:    ORT Risk Level calculation:    ORT Scoring interpretation table:  Score <3 = Low Risk for SUD  Score between 4-7 = Moderate Risk for SUD  Score >8 = High Risk for Opioid Abuse   PHQ-2 Depression Scale:  Total score:    PHQ-2 Scoring interpretation table: (Score and probability of major depressive disorder)  Score 0 = No depression  Score 1 = 15.4% Probability  Score 2 = 21.1% Probability  Score 3 = 38.4% Probability  Score 4 = 45.5% Probability  Score 5 = 56.4% Probability  Score 6 = 78.6% Probability   PHQ-9 Depression Scale:  Total score:    PHQ-9 Scoring interpretation table:  Score 0-4 = No depression  Score 5-9 = Mild depression  Score 10-14 = Moderate depression  Score 15-19 = Moderately severe depression  Score 20-27 = Severe depression (2.4 times higher risk of SUD and 2.89 times  higher risk of overuse)   Pharmacologic Plan: As per protocol, I have not taken over any controlled substance management, pending the results of ordered tests and/or consults.            Initial impression: Pending review of available data and ordered tests.  Meds   Current Outpatient Medications:    Accu-Chek Softclix Lancets lancets, USE TO CHECK BLOOD SUGAR THREE TIMES DAILY AS DIRECTED, Disp: 100 each, Rfl: 0   albuterol  (PROVENTIL ) (2.5 MG/3ML) 0.083% nebulizer solution, Take 3 mLs (2.5 mg total) by nebulization every 6 (six) hours as needed for wheezing. (Patient not taking: Reported on 09/06/2023), Disp: 75 mL, Rfl: 0   albuterol  (VENTOLIN  HFA) 108 (90 Base) MCG/ACT inhaler, Inhale 2 puffs into the lungs every 6 (six) hours as needed for wheezing or shortness of breath., Disp: 6.7 g, Rfl: 0   amLODipine  (NORVASC ) 10 MG tablet, Take 1 tablet (10 mg total) by mouth daily., Disp: 90 tablet, Rfl: 1   Blood Glucose Monitoring Suppl (ACCU-CHEK GUIDE) w/Device KIT, Check blood sugars 3 times a day before meals., Disp: 1 kit, Rfl: 0   celecoxib  (CELEBREX ) 100 MG capsule, TAKE ONE CAPSULE BY MOUTH TWICE DAILY, Disp: 60 capsule, Rfl: 0   Continuous Glucose Receiver (FREESTYLE LIBRE 3 READER) DEVI, Use to check glucose continuously., Disp: 1 each, Rfl: 0   Continuous Glucose Sensor (FREESTYLE LIBRE 3 SENSOR) MISC, Place 1 sensor on the skin every 14 days. Use to check glucose continuously, Disp: 2 each, Rfl: 6   diclofenac  Sodium (VOLTAREN ) 1 % GEL, APPLY TWO grams topically FOUR  TIMES DAILY (Patient not taking: Reported on 03/11/2024), Disp: 100 g, Rfl: 0   DULERA  100-5 MCG/ACT AERO, Inhale 2 puffs into the lungs 2 (two) times daily. (Patient not taking: Reported on 03/11/2024), Disp: 1 each, Rfl: 6   gabapentin  (NEURONTIN ) 300 MG capsule, Take 1 capsule (300 mg total) by mouth at bedtime., Disp: 30 capsule, Rfl: 5   glucose blood (ACCU-CHEK GUIDE) test strip, USE TO check blood sugar three times daily,  Disp: 100 strip, Rfl: 11   hydrocortisone  2.5 % cream, Apply once daily to affected area x 5 days., Disp: 30 g, Rfl: 0   Insulin  Glargine (BASAGLAR  KWIKPEN) 100 UNIT/ML, Inject 23 Units into the skin daily., Disp: 15 mL, Rfl: 6   Insulin  Pen Needle (LITETOUCH PEN NEEDLES) 31G X 6 MM MISC, USE TO administer insulin  EVERY DAY, Disp: 100 each, Rfl: 0   Lancets Misc. (ACCU-CHEK FASTCLIX LANCET) KIT, Check blood glucose at least 3 times daily., Disp: 1 kit, Rfl: 6   losartan  (COZAAR ) 25 MG tablet, Take 1 tablet (25 mg total) by mouth daily., Disp: 90 tablet, Rfl: 1   metFORMIN  (GLUCOPHAGE -XR) 500 MG 24 hr tablet, TAKE ONE TABLET BY MOUTH EVERY MORNING AND TAKE TWO TABLETS BY MOUTH EVERY IN THE EVENING, Disp: 90 tablet, Rfl: 6   methocarbamol  (ROBAXIN ) 500 MG tablet, Take 1 tablet (500 mg total) by mouth 2 (two) times daily as needed for muscle spasms., Disp: 180 tablet, Rfl: 1   Semaglutide , 1 MG/DOSE, 4 MG/3ML SOPN, Inject 1 mg as directed once a week. Please stop the 0.5 mg dose, Disp: 3 mL, Rfl: 1   traMADol  (ULTRAM ) 50 MG tablet, Take 1 tablet (50 mg total) by mouth daily as needed., Disp: 30 tablet, Rfl: 0  Imaging Review  Cervical Imaging: Cervical MR wo contrast: Results for orders placed during the hospital encounter of 09/24/23 MR CERVICAL SPINE WO CONTRAST  Narrative CLINICAL DATA:  Cervical stenosis  EXAM: MRI CERVICAL SPINE WITHOUT CONTRAST  TECHNIQUE: Multiplanar, multisequence MR imaging of the cervical spine was performed. No intravenous contrast was administered.  COMPARISON:  09/18/2020  FINDINGS: Alignment: No static listhesis. Loss of the normal cervical lordosis with straightening which may be positional versus secondary to muscle spasm.  Vertebrae: No acute fracture, evidence of discitis, or aggressive bone lesion.  Cord: Normal signal and morphology.  Posterior Fossa, vertebral arteries, paraspinal tissues: Posterior fossa demonstrates no focal abnormality.  Vertebral artery flow voids are maintained. Paraspinal soft tissues are unremarkable.  Disc levels:  Discs: Disc spaces are maintained.  C2-3: Mild disc bulge. Moderate left foraminal stenosis. No right foraminal stenosis. No spinal stenosis.  C3-4: Moderate broad disc osteophyte complex contacting the ventral cervical spinal cord. Severe left foraminal stenosis. Moderate right foraminal stenosis. Mild spinal stenosis.  C4-5: Mild disc osteophyte complex with a more focal broad right paracentral component flattening the ventral right paracentral cervical spinal cord. Moderate bilateral foraminal stenosis. Mild central canal stenosis.  C5-6: Mild disc osteophyte complex. Moderate left and mild right foraminal stenosis. Mild spinal stenosis.  C6-7: Mild disc osteophyte complex. No foraminal or central canal stenosis.  C7-T1: Central disc osteophyte complex. No foraminal or central canal stenosis.  IMPRESSION: 1. At C3-4 there is a moderate broad disc osteophyte complex contacting the ventral cervical spinal cord. Severe left foraminal stenosis. Moderate right foraminal stenosis. Mild spinal stenosis. 2. At C4-5 there is a mild disc osteophyte complex with a more focal broad right paracentral component flattening the ventral right paracentral cervical spinal  cord. Moderate bilateral foraminal stenosis. Mild central canal stenosis. 3. At C5-6 there is a mild disc osteophyte complex. Moderate left and mild right foraminal stenosis. Mild spinal stenosis. 4. No acute osseous injury of the cervical spine.   Electronically Signed By: Onnie Bilis M.D. On: 10/15/2023 15:29  Cervical CT wo contrast: Results for orders placed during the hospital encounter of 09/18/20 CT Cervical Spine Wo Contrast  Narrative CLINICAL DATA:  Fall last Thursday, pain  EXAM: CT CERVICAL SPINE WITHOUT CONTRAST  TECHNIQUE: Multidetector CT imaging of the cervical spine was performed  without intravenous contrast. Multiplanar CT image reconstructions were also generated.  COMPARISON:  None.  FINDINGS: Examination of the cervical spine is somewhat limited by motion artifact and body habitus with underpenetration  Alignment: Normal.  Skull base and vertebrae: No acute fracture. No primary bone lesion or focal pathologic process.  Soft tissues and spinal canal: No prevertebral fluid or swelling. No visible canal hematoma.  Disc levels: Mild multilevel disc space height loss and osteophytosis.  Upper chest: Negative.  Other: None.  IMPRESSION: 1. Examination of the cervical spine is somewhat limited by motion artifact and body habitus with underpenetration. Within this limitation, no fracture or static subluxation of the cervical spine. 2. Mild multilevel disc space height loss and osteophytosis.   Electronically Signed By: Hubbard Mad M.D. On: 09/18/2020 14:25  Cervical DG complete: Results for orders placed during the hospital encounter of 11/20/18 DG Cervical Spine Complete  Narrative CLINICAL DATA:  Chronic right-sided neck pain.  EXAM: CERVICAL SPINE - COMPLETE 4+ VIEW  COMPARISON:  Neck CT 11/29/2014  FINDINGS: There is no evidence of cervical spine fracture or prevertebral soft tissue swelling. Straightening of the cervical lordosis. Multilevel arthritic changes with mild narrowing of the disc spaces, endplate sclerosis and remodeling of vertebral bodies. Associated posterior facet arthropathy.  IMPRESSION: Multilevel osteoarthritic changes of the cervical spine, mild-to-moderate.   Electronically Signed By: Dobrinka  Dimitrova M.D. On: 11/20/2018 14:56  Lumbosacral Imaging: Lumbar DG (Complete) 4+V: Results for orders placed during the hospital encounter of 09/18/20 DG Lumbar Spine Complete  Narrative CLINICAL DATA:  Right-sided pain post fall  EXAM: LUMBAR SPINE - COMPLETE 4+ VIEW  COMPARISON:   09/02/2012  FINDINGS: Normal alignment. Mild narrowing of the L4-5 interspace. Negative for fracture. Spurring in the lower thoracic spine.  IMPRESSION: Negative for fracture or other acute bone abnormality.   Electronically Signed By: Nicoletta Barrier M.D. On: 09/18/2020 14:31  Complexity Note: Imaging results reviewed.                         ROS  Cardiovascular: {Hx; Cardiovascular History:210120525} Pulmonary or Respiratory: {Hx; Pumonary and/or Respiratory History:210120523} Neurological: {Hx; Neurological:210120504} Psychological-Psychiatric: {Hx; Psychological-Psychiatric History:210120512} Gastrointestinal: {Hx; Gastrointestinal:210120527} Genitourinary: {Hx; Genitourinary:210120506} Hematological: {Hx; Hematological:210120510} Endocrine: {Hx; Endocrine history:210120509} Rheumatologic: {Hx; Rheumatological:210120530} Musculoskeletal: {Hx; Musculoskeletal:210120528} Work History: {Hx; Work history:210120514}  Allergies  Elaine Marquez is allergic to ibuprofen .  Laboratory Chemistry Profile   Renal Lab Results  Component Value Date   BUN 11 03/11/2024   CREATININE 0.59 03/11/2024   LABCREA 71.5 03/11/2024   BCR 19 03/11/2024   GFRAA 118 12/24/2019   GFRNONAA 103 12/24/2019   SPECGRAV 1.010 01/28/2019   PHUR 5.5 01/28/2019   PROTEINUR NEGATIVE 05/15/2018     Electrolytes Lab Results  Component Value Date   NA 138 03/11/2024   K 4.4 03/11/2024   CL 102 03/11/2024   CALCIUM 10.3 (H) 03/11/2024  Hepatic Lab Results  Component Value Date   AST 14 03/11/2024   ALT 18 03/11/2024   ALBUMIN 4.6 03/11/2024   ALKPHOS 33 (L) 03/11/2024     ID No results found for: "LYMEIGGIGMAB", "HIV", "SARSCOV2NAA", "STAPHAUREUS", "MRSAPCR", "HCVAB", "PREGTESTUR", "RMSFIGG", "QFVRPH1IGG", "QFVRPH2IGG"   Bone No results found for: "VD25OH", "VD125OH2TOT", "ZO1096EA5", "WU9811BJ4", "25OHVITD1", "25OHVITD2", "25OHVITD3", "TESTOFREE", "TESTOSTERONE"   Endocrine Lab Results   Component Value Date   GLUCOSE 209 (H) 03/11/2024   GLUCOSEU >=500 (A) 05/15/2018   HGBA1C 9.2 (A) 03/11/2024   TSH 1.620 09/12/2018     Neuropathy Lab Results  Component Value Date   HGBA1C 9.2 (A) 03/11/2024     CNS No results found for: "COLORCSF", "APPEARCSF", "RBCCOUNTCSF", "WBCCSF", "POLYSCSF", "LYMPHSCSF", "EOSCSF", "PROTEINCSF", "GLUCCSF", "JCVIRUS", "CSFOLI", "IGGCSF", "LABACHR", "ACETBL"   Inflammation (CRP: Acute  ESR: Chronic) No results found for: "CRP", "ESRSEDRATE", "LATICACIDVEN"   Rheumatology Lab Results  Component Value Date   LABURIC 5.6 03/22/2008     Coagulation Lab Results  Component Value Date   PLT 408 03/11/2024     Cardiovascular Lab Results  Component Value Date   HGB 14.4 03/11/2024   HCT 44.4 03/11/2024     Screening No results found for: "SARSCOV2NAA", "COVIDSOURCE", "STAPHAUREUS", "MRSAPCR", "HCVAB", "HIV", "PREGTESTUR"   Cancer No results found for: "CEA", "CA125", "LABCA2"   Allergens No results found for: "ALMOND", "APPLE", "ASPARAGUS", "AVOCADO", "BANANA", "BARLEY", "BASIL", "BAYLEAF", "GREENBEAN", "LIMABEAN", "WHITEBEAN", "BEEFIGE", "REDBEET", "BLUEBERRY", "BROCCOLI", "CABBAGE", "MELON", "CARROT", "CASEIN", "CASHEWNUT", "CAULIFLOWER", "CELERY"     Note: Lab results reviewed.  PFSH  Drug: Elaine Marquez  reports no history of drug use. Alcohol:  reports current alcohol use. Tobacco:  reports that she has never smoked. She has never used smokeless tobacco. Medical:  has a past medical history of Asthma, Diabetes mellitus without complication (HCC), Hypertension, and Peritonsillar abscess. Family: family history includes CVA in her brother; Diabetes in her father and mother; Hypertension in her father and mother.  Past Surgical History:  Procedure Laterality Date   TUBAL LIGATION     Active Ambulatory Problems    Diagnosis Date Noted   Type 2 diabetes mellitus without complication, without long-term current use of insulin   (HCC) 05/15/2018   Spondylosis without myelopathy or radiculopathy, cervical region 02/12/2019   Class 3 severe obesity due to excess calories with serious comorbidity and body mass index (BMI) of 40.0 to 44.9 in adult 09/20/2021   Arthropathy of cervical facet joint 04/22/2024   Ossification of posterior longitudinal ligament (HCC) 04/22/2024   Spinal stenosis in cervical region 04/22/2024   Chronic pain syndrome 04/22/2024   Pharmacologic therapy 04/22/2024   Disorder of skeletal system 04/22/2024   Problems influencing health status 04/22/2024   Abnormal MRI, cervical spine (10/15/2023) 04/22/2024   Resolved Ambulatory Problems    Diagnosis Date Noted   No Resolved Ambulatory Problems   Past Medical History:  Diagnosis Date   Asthma    Diabetes mellitus without complication (HCC)    Hypertension    Peritonsillar abscess    Constitutional Exam  General appearance: Well nourished, well developed, and well hydrated. In no apparent acute distress There were no vitals filed for this visit. BMI Assessment: Estimated body mass index is 44.97 kg/m as calculated from the following:   Height as of 03/11/24: 5\' 4"  (1.626 m).   Weight as of 03/11/24: 262 lb (118.8 kg).  BMI interpretation table: BMI level Category Range association with higher incidence of chronic pain  <18 kg/m2 Underweight   18.5-24.9  kg/m2 Ideal body weight   25-29.9 kg/m2 Overweight Increased incidence by 20%  30-34.9 kg/m2 Obese (Class I) Increased incidence by 68%  35-39.9 kg/m2 Severe obesity (Class II) Increased incidence by 136%  >40 kg/m2 Extreme obesity (Class III) Increased incidence by 254%   Patient's current BMI Ideal Body weight  There is no height or weight on file to calculate BMI. Patient weight not recorded   BMI Readings from Last 4 Encounters:  03/11/24 44.97 kg/m  09/06/23 44.29 kg/m  12/14/22 44.53 kg/m  03/13/22 46.52 kg/m   Wt Readings from Last 4 Encounters:  03/11/24 262 lb  (118.8 kg)  09/06/23 258 lb (117 kg)  12/14/22 259 lb 6.4 oz (117.7 kg)  03/13/22 271 lb (122.9 kg)    Psych/Mental status: Alert, oriented x 3 (person, place, & time)       Eyes: PERLA Respiratory: No evidence of acute respiratory distress  Assessment  Primary Diagnosis & Pertinent Problem List: The primary encounter diagnosis was Chronic pain syndrome. Diagnoses of Pharmacologic therapy, Disorder of skeletal system, Problems influencing health status, and Abnormal MRI, cervical spine (10/15/2023) were also pertinent to this visit.  Visit Diagnosis (New problems to examiner): 1. Chronic pain syndrome   2. Pharmacologic therapy   3. Disorder of skeletal system   4. Problems influencing health status   5. Abnormal MRI, cervical spine (10/15/2023)    Plan of Care (Initial workup plan)  Note: Elaine Marquez was reminded that as per protocol, today's visit has been an evaluation only. We have not taken over the patient's controlled substance management.  Problem-specific plan: Assessment and Plan            Lab Orders  No laboratory test(s) ordered today   Imaging Orders  No imaging studies ordered today   Referral Orders  No referral(s) requested today   Procedure Orders    No procedure(s) ordered today   Pharmacotherapy (current): Medications ordered:  No orders of the defined types were placed in this encounter.  Medications administered during this visit: Elaine Marquez had no medications administered during this visit.   Analgesic Pharmacotherapy:  Opioid Analgesics: For patients currently taking or requesting to take opioid analgesics, in accordance with [place]  Medical Board Guidelines, we will assess their risks and indications for the use of these substances. After completing our evaluation, we may offer recommendations, but we no longer take patients for medication management. The prescribing physician will ultimately decide, based on his/her training  and level of comfort whether to adopt any of the recommendations, including whether or not to prescribe such medicines.  Membrane stabilizer: To be determined at a later time  Muscle relaxant: To be determined at a later time  NSAID: To be determined at a later time  Other analgesic(s): To be determined at a later time   Interventional management options: Elaine Marquez was informed that there is no guarantee that she would be a candidate for interventional therapies. The decision will be based on the results of diagnostic studies, as well as Elaine Marquez's risk profile.  Procedure(s) under consideration:  Pending results of ordered studies     Interventional Therapies  Risk Factors  Considerations  Medical Comorbidities:     Planned  Pending:      Under consideration:   Pending   Completed:   None at this time   Therapeutic  Palliative (PRN) options:   None established   Completed by other providers:   None reported  Provider-requested follow-up: No follow-ups on file.  Future Appointments  Date Time Provider Department Center  04/23/2024  9:00 AM Renaldo Caroli, MD [facility] None  07/11/2024  2:30 PM Lawrance Presume, MD [facility] None   I discussed the assessment and treatment plan with the patient. The patient was provided an opportunity to ask questions and all were answered. The patient agreed with the plan and demonstrated an understanding of the instructions.  Patient advised to call back or seek an in-person evaluation if the symptoms or condition worsens.  Duration of encounter: *** minutes.  Total time on encounter, as per AMA guidelines included both the face-to-face and non-face-to-face time personally spent by the physician and/or other qualified health care professional(s) on the day of the encounter (includes time in activities that require the physician or other qualified health care professional and does not include time in activities normally  performed by clinical staff). Physician's time may include the following activities when performed: Preparing to see the patient (e.g., pre-charting review of records, searching for previously ordered imaging, lab work, and nerve conduction tests) Review of prior analgesic pharmacotherapies. Reviewing PMP Interpreting ordered tests (e.g., lab work, imaging, nerve conduction tests) Performing post-procedure evaluations, including interpretation of diagnostic procedures Obtaining and/or reviewing separately obtained history Performing a medically appropriate examination and/or evaluation Counseling and educating the patient/family/caregiver Ordering medications, tests, or procedures Referring and communicating with other health care professionals (when not separately reported) Documenting clinical information in the electronic or other health record Independently interpreting results (not separately reported) and communicating results to the patient/ family/caregiver Care coordination (not separately reported)  Note by: Candi Chafe, MD (TTS and AI technology used. I apologize for any typographical errors that were not detected and corrected.) Date: 04/23/2024; Time: 12:26 PM

## 2024-04-23 ENCOUNTER — Ambulatory Visit (HOSPITAL_BASED_OUTPATIENT_CLINIC_OR_DEPARTMENT_OTHER): Admitting: Pain Medicine

## 2024-04-23 ENCOUNTER — Other Ambulatory Visit: Payer: Self-pay | Admitting: Internal Medicine

## 2024-04-23 DIAGNOSIS — R937 Abnormal findings on diagnostic imaging of other parts of musculoskeletal system: Secondary | ICD-10-CM

## 2024-04-23 DIAGNOSIS — M899 Disorder of bone, unspecified: Secondary | ICD-10-CM

## 2024-04-23 DIAGNOSIS — E1169 Type 2 diabetes mellitus with other specified complication: Secondary | ICD-10-CM

## 2024-04-23 DIAGNOSIS — Z91199 Patient's noncompliance with other medical treatment and regimen due to unspecified reason: Secondary | ICD-10-CM

## 2024-04-23 DIAGNOSIS — G8929 Other chronic pain: Secondary | ICD-10-CM

## 2024-04-23 DIAGNOSIS — Z79899 Other long term (current) drug therapy: Secondary | ICD-10-CM

## 2024-04-23 DIAGNOSIS — G894 Chronic pain syndrome: Secondary | ICD-10-CM

## 2024-04-23 DIAGNOSIS — Z789 Other specified health status: Secondary | ICD-10-CM

## 2024-04-28 ENCOUNTER — Other Ambulatory Visit: Payer: Self-pay | Admitting: Internal Medicine

## 2024-04-28 ENCOUNTER — Telehealth: Payer: Self-pay | Admitting: Internal Medicine

## 2024-04-28 MED ORDER — SEMAGLUTIDE (2 MG/DOSE) 8 MG/3ML ~~LOC~~ SOPN: 2.0000 mg | PEN_INJECTOR | SUBCUTANEOUS | 3 refills | Status: DC

## 2024-04-28 NOTE — Telephone Encounter (Signed)
 Copied from CRM [id number]. Topic: Clinical - Medication Question >> Apr 28, 2024 10:43 AM Lizabeth Riggs wrote: Reason for CRM:  Patient wants to see if Dr. Lincoln Renshaw will increase dosage of Ozempic . She will need a refill sent in next week. Please send order to [facility]. She is doing really good on medication. She has no issues. Please call her is there are any issues. Thanks

## 2024-04-28 NOTE — Telephone Encounter (Signed)
 Please see nursing note.  It looks like her A1c was 9.2 (down from 14.6) on 03/11/24. Ozempic  was started at 0.5 mg weekly at that time as she was tolerating the higher dose of Trulicity . She was then increased to 1 mg weekly on 04/08/2024. Her fourth dose of 1mg  weekly will occur next week so it's time to optimize to 2 mg weekly if you're okay with that.

## 2024-05-01 ENCOUNTER — Other Ambulatory Visit: Payer: Self-pay

## 2024-05-05 DIAGNOSIS — F339 Major depressive disorder, recurrent, unspecified: Secondary | ICD-10-CM | POA: Diagnosis not present

## 2024-05-06 ENCOUNTER — Other Ambulatory Visit: Payer: Self-pay | Admitting: Internal Medicine

## 2024-05-06 DIAGNOSIS — F339 Major depressive disorder, recurrent, unspecified: Secondary | ICD-10-CM | POA: Diagnosis not present

## 2024-05-06 DIAGNOSIS — E1142 Type 2 diabetes mellitus with diabetic polyneuropathy: Secondary | ICD-10-CM

## 2024-05-06 DIAGNOSIS — E1159 Type 2 diabetes mellitus with other circulatory complications: Secondary | ICD-10-CM

## 2024-05-06 MED ORDER — SEMAGLUTIDE (2 MG/DOSE) 8 MG/3ML ~~LOC~~ SOPN: 2.0000 mg | PEN_INJECTOR | SUBCUTANEOUS | 3 refills | Status: DC

## 2024-05-12 DIAGNOSIS — F339 Major depressive disorder, recurrent, unspecified: Secondary | ICD-10-CM | POA: Diagnosis not present

## 2024-05-13 DIAGNOSIS — F339 Major depressive disorder, recurrent, unspecified: Secondary | ICD-10-CM | POA: Diagnosis not present

## 2024-05-19 ENCOUNTER — Ambulatory Visit: Admitting: Pain Medicine

## 2024-05-19 DIAGNOSIS — F339 Major depressive disorder, recurrent, unspecified: Secondary | ICD-10-CM | POA: Diagnosis not present

## 2024-05-20 DIAGNOSIS — F339 Major depressive disorder, recurrent, unspecified: Secondary | ICD-10-CM | POA: Diagnosis not present

## 2024-05-22 DIAGNOSIS — F339 Major depressive disorder, recurrent, unspecified: Secondary | ICD-10-CM | POA: Diagnosis not present

## 2024-05-25 NOTE — Progress Notes (Unsigned)
 PROVIDER NOTE: Interpretation of information contained herein should be left to medically-trained personnel. Specific patient instructions are provided elsewhere under "Patient Instructions" section of medical record. This document was created in part using AI and STT-dictation technology, any transcriptional errors that may result from this process are unintentional.  Patient: Elaine Marquez  Service: E/M Encounter  Provider: Candi Chafe, MD  DOB: 1971/03/22  Delivery: Face-to-face  Specialty: Interventional Pain Management  MRN: [id number]  Setting: Ambulatory outpatient facility  Specialty designation: 09  Type: New Patient  Location: Outpatient office facility  PCP: Lawrance Presume, MD  DOS: 05/26/2024    Referring Prov.: Annis Kinder, MD   Primary Reason(s) for Visit: Encounter for initial evaluation of one or more chronic problems (new to examiner) potentially causing chronic pain, and posing a threat to normal musculoskeletal function. (Level of risk: High) CC: No chief complaint on file.  HPI  Elaine Marquez is a 53 y.o. year old, female patient, who comes for the first time to our practice referred by Annis Kinder, MD for our initial evaluation of her chronic pain. She has Type 2 diabetes mellitus without complication, without long-term current use of insulin  (HCC); Spondylosis without myelopathy or radiculopathy, cervical region; Class 3 severe obesity due to excess calories with serious comorbidity and body mass index (BMI) of 40.0 to 44.9 in adult; Arthropathy of cervical facet joint; Ossification of posterior longitudinal ligament (HCC); Spinal stenosis in cervical region; Chronic pain syndrome; Pharmacologic therapy; Disorder of skeletal system; Problems influencing health status; and Abnormal MRI, cervical spine (10/15/2023) on their problem list. Today she comes in for evaluation of her No chief complaint on file.  Pain Assessment: Location:     Radiating:   Onset:    Duration:   Quality:   Severity:  /10 (subjective, self-reported pain score)  Effect on ADL:   Timing:   Modifying factors:   BP:    HR:    Onset and Duration: {Hx; Onset and Duration:210120511} Cause of pain: {Hx; Cause:210120521} Severity: {Pain Severity:210120502} Timing: {Symptoms; Timing:210120501} Aggravating Factors: {Causes; Aggravating pain factors:210120507} Alleviating Factors: {Causes; Alleviating Factors:210120500} Associated Problems: {Hx; Associated problems:210120515} Quality of Pain: {Hx; Symptom quality or Descriptor:210120531} Previous Examinations or Tests: {Hx; Previous examinations or test:210120529} Previous Treatments: {Hx; Previous Treatment:210120503}  Elaine Marquez is being evaluated for possible interventional pain management therapies for the treatment of her chronic pain.  Discussed the use of AI scribe software for clinical note transcription with the patient, who gave verbal consent to proceed.  History of Present Illness           ***  Elaine Marquez has been informed that this initial visit was an evaluation only.  On the follow up appointment I will go over the results, including ordered tests and available interventional therapies. At that time she will have the opportunity to decide whether to proceed with offered therapies or not. In the event that Elaine Marquez prefers avoiding interventional options, this will conclude our involvement in the case.  Medication management recommendations may be provided upon request.  Patient informed that diagnostic tests may be ordered to assist in identifying underlying causes, narrow the list of differential diagnoses and aid in determining candidacy for (or contraindications to) planned therapeutic interventions.  Historic Controlled Substance Pharmacotherapy Review PMP and historical list of controlled substances: Tramadol  Hcl 50 Mg Tablet ; Gabapentin  300 Mg Capsule ;Diazepam  5 Mg Tablet  Most recently  prescribed controlled substance(s): Opioid Analgesic: Tramadol  Hcl 50 Mg Tablet  MME/day: 40 mg/day  Historical Monitoring: The patient  reports no history of drug use. List of prior UDS Testing: Lab Results  Component Value Date   PCPQUANT Negative 03/11/2024   CANNABQUANT Negative 03/11/2024   Historical Background Evaluation: [place] PMP: PDMP reviewed during this encounter. Review of the past 71-months conducted.             PMP NARX Score Report:  Narcotic: 411 Sedative: 180 Stimulant: 000 [place] Department of public safety, offender search: Engineer, mining Information) Non-contributory Risk Assessment Profile: Aberrant behavior: None observed or detected today Risk factors for fatal opioid overdose: None identified today PMP NARX Overdose Risk Score: 360 Fatal overdose hazard ratio (HR): Calculation deferred Non-fatal overdose hazard ratio (HR): Calculation deferred Risk of opioid abuse or dependence: 0.7-3.0% with doses <= 36 MME/day and 6.1-26% with doses >= 120 MME/day. Substance use disorder (SUD) risk level: See below Personal History of Substance Abuse (SUD-Substance use disorder):  Alcohol:    Illegal Drugs:    Rx Drugs:    ORT Risk Level calculation:    ORT Scoring interpretation table:  Score <3 = Low Risk for SUD  Score between 4-7 = Moderate Risk for SUD  Score >8 = High Risk for Opioid Abuse   PHQ-2 Depression Scale:  Total score:    PHQ-2 Scoring interpretation table: (Score and probability of major depressive disorder)  Score 0 = No depression  Score 1 = 15.4% Probability  Score 2 = 21.1% Probability  Score 3 = 38.4% Probability  Score 4 = 45.5% Probability  Score 5 = 56.4% Probability  Score 6 = 78.6% Probability   PHQ-9 Depression Scale:  Total score:    PHQ-9 Scoring interpretation table:  Score 0-4 = No depression  Score 5-9 = Mild depression  Score 10-14 = Moderate depression  Score 15-19 = Moderately severe depression  Score 20-27 = Severe depression  (2.4 times higher risk of SUD and 2.89 times higher risk of overuse)   Pharmacologic Plan: As per protocol, I have not taken over any controlled substance management, pending the results of ordered tests and/or consults.            Initial impression: Pending review of available data and ordered tests.  Meds   Current Outpatient Medications:    Accu-Chek Softclix Lancets lancets, USE TO CHECK BLOOD SUGAR THREE TIMES DAILY AS DIRECTED, Disp: 100 each, Rfl: 0   albuterol  (PROVENTIL ) (2.5 MG/3ML) 0.083% nebulizer solution, Take 3 mLs (2.5 mg total) by nebulization every 6 (six) hours as needed for wheezing. (Patient not taking: Reported on 09/06/2023), Disp: 75 mL, Rfl: 0   albuterol  (VENTOLIN  HFA) 108 (90 Base) MCG/ACT inhaler, Inhale 2 puffs into the lungs every 6 (six) hours as needed for wheezing or shortness of breath., Disp: 6.7 g, Rfl: 0   amLODipine  (NORVASC ) 10 MG tablet, Take 1 tablet (10 mg total) by mouth daily., Disp: 90 tablet, Rfl: 1   Blood Glucose Monitoring Suppl (ACCU-CHEK GUIDE) w/Device KIT, Check blood sugars 3 times a day before meals., Disp: 1 kit, Rfl: 0   celecoxib  (CELEBREX ) 100 MG capsule, TAKE ONE CAPSULE BY MOUTH TWICE DAILY, Disp: 60 capsule, Rfl: 0   Continuous Glucose Receiver (FREESTYLE LIBRE 3 READER) DEVI, Use to check glucose continuously., Disp: 1 each, Rfl: 0   Continuous Glucose Sensor (FREESTYLE LIBRE 3 SENSOR) MISC, Place 1 sensor on the skin every 14 days. Use to check glucose continuously, Disp: 2 each, Rfl: 6   diclofenac  Sodium (VOLTAREN ) 1 %  GEL, APPLY TWO grams topically FOUR TIMES DAILY (Patient not taking: Reported on 03/11/2024), Disp: 100 g, Rfl: 0   DULERA  100-5 MCG/ACT AERO, Inhale 2 puffs into the lungs 2 (two) times daily. (Patient not taking: Reported on 03/11/2024), Disp: 1 each, Rfl: 6   gabapentin  (NEURONTIN ) 300 MG capsule, Take 1 capsule (300 mg total) by mouth at bedtime., Disp: 30 capsule, Rfl: 5   glucose blood (ACCU-CHEK GUIDE) test  strip, USE TO check blood sugar three times daily, Disp: 100 strip, Rfl: 11   hydrocortisone  2.5 % cream, Apply once daily to affected area x 5 days., Disp: 30 g, Rfl: 0   Insulin  Glargine (BASAGLAR  KWIKPEN) 100 UNIT/ML, Inject 23 Units into the skin daily., Disp: 15 mL, Rfl: 6   Insulin  Pen Needle (LITETOUCH PEN NEEDLES) 31G X 6 MM MISC, USE TO administer insulin  EVERY DAY, Disp: 100 each, Rfl: 0   Lancets Misc. (ACCU-CHEK FASTCLIX LANCET) KIT, Check blood glucose at least 3 times daily., Disp: 1 kit, Rfl: 6   losartan  (COZAAR ) 25 MG tablet, Take 1 tablet (25 mg total) by mouth daily., Disp: 90 tablet, Rfl: 1   metFORMIN  (GLUCOPHAGE -XR) 500 MG 24 hr tablet, TAKE ONE TABLET BY MOUTH EVERY MORNING AND TAKE TWO TABLETS BY MOUTH EVERY IN THE EVENING, Disp: 270 tablet, Rfl: 1   methocarbamol  (ROBAXIN ) 500 MG tablet, Take 1 tablet (500 mg total) by mouth 2 (two) times daily as needed for muscle spasms., Disp: 180 tablet, Rfl: 1   Semaglutide , 2 MG/DOSE, 8 MG/3ML SOPN, Inject 2 mg as directed once a week., Disp: 3 mL, Rfl: 3   traMADol  (ULTRAM ) 50 MG tablet, Take 1 tablet (50 mg total) by mouth daily as needed., Disp: 30 tablet, Rfl: 0  Imaging Review  Cervical Imaging: Cervical MR wo contrast: Results for orders placed during the hospital encounter of 09/24/23  MR CERVICAL SPINE WO CONTRAST  Narrative CLINICAL DATA:  Cervical stenosis  EXAM: MRI CERVICAL SPINE WITHOUT CONTRAST  TECHNIQUE: Multiplanar, multisequence MR imaging of the cervical spine was performed. No intravenous contrast was administered.  COMPARISON:  09/18/2020  FINDINGS: Alignment: No static listhesis. Loss of the normal cervical lordosis with straightening which may be positional versus secondary to muscle spasm.  Vertebrae: No acute fracture, evidence of discitis, or aggressive bone lesion.  Cord: Normal signal and morphology.  Posterior Fossa, vertebral arteries, paraspinal tissues: Posterior fossa demonstrates  no focal abnormality. Vertebral artery flow voids are maintained. Paraspinal soft tissues are unremarkable.  Disc levels:  Discs: Disc spaces are maintained.  C2-3: Mild disc bulge. Moderate left foraminal stenosis. No right foraminal stenosis. No spinal stenosis.  C3-4: Moderate broad disc osteophyte complex contacting the ventral cervical spinal cord. Severe left foraminal stenosis. Moderate right foraminal stenosis. Mild spinal stenosis.  C4-5: Mild disc osteophyte complex with a more focal broad right paracentral component flattening the ventral right paracentral cervical spinal cord. Moderate bilateral foraminal stenosis. Mild central canal stenosis.  C5-6: Mild disc osteophyte complex. Moderate left and mild right foraminal stenosis. Mild spinal stenosis.  C6-7: Mild disc osteophyte complex. No foraminal or central canal stenosis.  C7-T1: Central disc osteophyte complex. No foraminal or central canal stenosis.  IMPRESSION: 1. At C3-4 there is a moderate broad disc osteophyte complex contacting the ventral cervical spinal cord. Severe left foraminal stenosis. Moderate right foraminal stenosis. Mild spinal stenosis. 2. At C4-5 there is a mild disc osteophyte complex with a more focal broad right paracentral component flattening the ventral right paracentral cervical  spinal cord. Moderate bilateral foraminal stenosis. Mild central canal stenosis. 3. At C5-6 there is a mild disc osteophyte complex. Moderate left and mild right foraminal stenosis. Mild spinal stenosis. 4. No acute osseous injury of the cervical spine.   Electronically Signed By: Onnie Bilis M.D. On: 10/15/2023 15:29  Cervical MR wo contrast: No results found for this or any previous visit.  Cervical MR w/wo contrast: No results found for this or any previous visit.  Cervical MR w contrast: No results found for this or any previous visit.  Cervical CT wo contrast: Results for orders placed during  the hospital encounter of 09/18/20  CT Cervical Spine Wo Contrast  Narrative CLINICAL DATA:  Fall last Thursday, pain  EXAM: CT CERVICAL SPINE WITHOUT CONTRAST  TECHNIQUE: Multidetector CT imaging of the cervical spine was performed without intravenous contrast. Multiplanar CT image reconstructions were also generated.  COMPARISON:  None.  FINDINGS: Examination of the cervical spine is somewhat limited by motion artifact and body habitus with underpenetration  Alignment: Normal.  Skull base and vertebrae: No acute fracture. No primary bone lesion or focal pathologic process.  Soft tissues and spinal canal: No prevertebral fluid or swelling. No visible canal hematoma.  Disc levels: Mild multilevel disc space height loss and osteophytosis.  Upper chest: Negative.  Other: None.  IMPRESSION: 1. Examination of the cervical spine is somewhat limited by motion artifact and body habitus with underpenetration. Within this limitation, no fracture or static subluxation of the cervical spine. 2. Mild multilevel disc space height loss and osteophytosis.   Electronically Signed By: Hubbard Mad M.D. On: 09/18/2020 14:25  Cervical CT w/wo contrast: No results found for this or any previous visit.  Cervical CT w/wo contrast: No results found for this or any previous visit.  Cervical CT w contrast: No results found for this or any previous visit.  Cervical CT outside: No results found for this or any previous visit.  Cervical DG 1 view: No results found for this or any previous visit.  Cervical DG 2-3 views: No results found for this or any previous visit.  Cervical DG F/E views: No results found for this or any previous visit.  Cervical DG 2-3 clearing views: No results found for this or any previous visit.  Cervical DG Bending/F/E views: No results found for this or any previous visit.  Cervical DG complete: Results for orders placed during the hospital encounter of  11/20/18  DG Cervical Spine Complete  Narrative CLINICAL DATA:  Chronic right-sided neck pain.  EXAM: CERVICAL SPINE - COMPLETE 4+ VIEW  COMPARISON:  Neck CT 11/29/2014  FINDINGS: There is no evidence of cervical spine fracture or prevertebral soft tissue swelling. Straightening of the cervical lordosis. Multilevel arthritic changes with mild narrowing of the disc spaces, endplate sclerosis and remodeling of vertebral bodies. Associated posterior facet arthropathy.  IMPRESSION: Multilevel osteoarthritic changes of the cervical spine, mild-to-moderate.   Electronically Signed By: Dobrinka  Dimitrova M.D. On: 11/20/2018 14:56  Cervical DG Myelogram views: No results found for this or any previous visit.  Cervical DG Myelogram views: No results found for this or any previous visit.  Cervical Discogram views: No results found for this or any previous visit.   Shoulder Imaging: Shoulder-R MR w contrast: No results found for this or any previous visit.  Shoulder-L MR w contrast: No results found for this or any previous visit.  Shoulder-R MR w/wo contrast: No results found for this or any previous visit.  Shoulder-L MR w/wo  contrast: No results found for this or any previous visit.  Shoulder-R MR wo contrast: No results found for this or any previous visit.  Shoulder-L MR wo contrast: No results found for this or any previous visit.  Shoulder-R CT w contrast: No results found for this or any previous visit.  Shoulder-L CT w contrast: No results found for this or any previous visit.  Shoulder-R CT w/wo contrast: No results found for this or any previous visit.  Shoulder-L CT w/wo contrast: No results found for this or any previous visit.  Shoulder-R CT wo contrast: No results found for this or any previous visit.  Shoulder-L CT wo contrast: No results found for this or any previous visit.  Shoulder-R DG Arthrogram: No results found for this or any previous  visit.  Shoulder-L DG Arthrogram: No results found for this or any previous visit.  Shoulder-R DG 1 view: No results found for this or any previous visit.  Shoulder-L DG 1 view: No results found for this or any previous visit.  Shoulder-R DG: No results found for this or any previous visit.  Shoulder-L DG: No results found for this or any previous visit.   Thoracic Imaging: Thoracic MR wo contrast: No results found for this or any previous visit.  Thoracic MR wo contrast: No results found for this or any previous visit.  Thoracic MR w/wo contrast: No results found for this or any previous visit.  Thoracic MR w contrast: No results found for this or any previous visit.  Thoracic CT wo contrast: No results found for this or any previous visit.  Thoracic CT w/wo contrast: No results found for this or any previous visit.  Thoracic CT w/wo contrast: No results found for this or any previous visit.  Thoracic CT w contrast: No results found for this or any previous visit.  Thoracic DG 2-3 views: No results found for this or any previous visit.  Thoracic DG 4 views: No results found for this or any previous visit.  Thoracic DG: No results found for this or any previous visit.  Thoracic DG w/swimmers view: No results found for this or any previous visit.  Thoracic DG Myelogram views: No results found for this or any previous visit.  Thoracic DG Myelogram views: No results found for this or any previous visit.   Lumbosacral Imaging: Lumbar MR wo contrast: No results found for this or any previous visit.  Lumbar MR wo contrast: No results found for this or any previous visit.  Lumbar MR w/wo contrast: No results found for this or any previous visit.  Lumbar MR w/wo contrast: No results found for this or any previous visit.  Lumbar MR w contrast: No results found for this or any previous visit.  Lumbar CT wo contrast: No results found for this or any previous  visit.  Lumbar CT w/wo contrast: No results found for this or any previous visit.  Lumbar CT w/wo contrast: No results found for this or any previous visit.  Lumbar CT w contrast: No results found for this or any previous visit.  Lumbar DG 1V: No results found for this or any previous visit.  Lumbar DG 1V (Clearing): No results found for this or any previous visit.  Lumbar DG 2-3V (Clearing): No results found for this or any previous visit.  Lumbar DG 2-3 views: No results found for this or any previous visit.  Lumbar DG (Complete) 4+V: Results for orders placed during the hospital encounter of 09/18/20  DG  Lumbar Spine Complete  Narrative CLINICAL DATA:  Right-sided pain post fall  EXAM: LUMBAR SPINE - COMPLETE 4+ VIEW  COMPARISON:  09/02/2012  FINDINGS: Normal alignment. Mild narrowing of the L4-5 interspace. Negative for fracture. Spurring in the lower thoracic spine.  IMPRESSION: Negative for fracture or other acute bone abnormality.   Electronically Signed By: Nicoletta Barrier M.D. On: 09/18/2020 14:31        Lumbar DG F/E views: No results found for this or any previous visit.        Lumbar DG Bending views: No results found for this or any previous visit.        Lumbar DG Myelogram views: No results found for this or any previous visit.  Lumbar DG Myelogram: No results found for this or any previous visit.  Lumbar DG Myelogram: No results found for this or any previous visit.  Lumbar DG Myelogram: No results found for this or any previous visit.  Lumbar DG Myelogram Lumbosacral: No results found for this or any previous visit.  Lumbar DG Diskogram views: No results found for this or any previous visit.  Lumbar DG Diskogram views: No results found for this or any previous visit.  Lumbar DG Epidurogram OP: No results found for this or any previous visit.  Lumbar DG Epidurogram IP: No results found for this or any previous visit.   Sacroiliac Joint  Imaging: Sacroiliac Joint DG: No results found for this or any previous visit.  Sacroiliac Joint MR w/wo contrast: No results found for this or any previous visit.  Sacroiliac Joint MR wo contrast: No results found for this or any previous visit.   Spine Imaging: Whole Spine DG Myelogram views: No results found for this or any previous visit.  Whole Spine MR Mets screen: No results found for this or any previous visit.  Whole Spine MR Mets screen: No results found for this or any previous visit.  Whole Spine MR w/wo: No results found for this or any previous visit.  MRA Spinal Canal w/ cm: No results found for this or any previous visit.  MRA Spinal Canal wo/ cm: No results found for this or any previous visit.  MRA Spinal Canal w/wo cm: No results found for this or any previous visit.  Spine Outside MR Films: No results found for this or any previous visit.  Spine Outside CT Films: No results found for this or any previous visit.  CT-Guided Biopsy: No results found for this or any previous visit.  CT-Guided Needle Placement: No results found for this or any previous visit.  DG Spine outside: No results found for this or any previous visit.  IR Spine outside: No results found for this or any previous visit.  NM Spine outside: No results found for this or any previous visit.   Hip Imaging: Hip-R MR w contrast: No results found for this or any previous visit.  Hip-L MR w contrast: No results found for this or any previous visit.  Hip-R MR w/wo contrast: No results found for this or any previous visit.  Hip-L MR w/wo contrast: No results found for this or any previous visit.  Hip-R MR wo contrast: No results found for this or any previous visit.  Hip-L MR wo contrast: No results found for this or any previous visit.  Hip-R CT w contrast: No results found for this or any previous visit.  Hip-L CT w contrast: No results found for this or any previous visit.  Hip-R CT  w/wo contrast: No results found for this or any previous visit.  Hip-L CT w/wo contrast: No results found for this or any previous visit.  Hip-R CT wo contrast: No results found for this or any previous visit.  Hip-L CT wo contrast: No results found for this or any previous visit.  Hip-R DG 2-3 views: No results found for this or any previous visit.  Hip-L DG 2-3 views: No results found for this or any previous visit.  Hip-R DG Arthrogram: No results found for this or any previous visit.  Hip-L DG Arthrogram: No results found for this or any previous visit.  Hip-B DG Bilateral: No results found for this or any previous visit.  Hip-B DG Bilateral (5V): No results found for this or any previous visit.   Knee Imaging: Knee-R MR w contrast: No results found for this or any previous visit.  Knee-L MR w/o contrast: No results found for this or any previous visit.  Knee-R MR w/wo contrast: No results found for this or any previous visit.  Knee-L MR w/wo contrast: No results found for this or any previous visit.  Knee-R MR wo contrast: No results found for this or any previous visit.  Knee-L MR wo contrast: No results found for this or any previous visit.  Knee-R CT w contrast: No results found for this or any previous visit.  Knee-L CT w contrast: No results found for this or any previous visit.  Knee-R CT w/wo contrast: No results found for this or any previous visit.  Knee-L CT w/wo contrast: No results found for this or any previous visit.  Knee-R CT wo contrast: No results found for this or any previous visit.  Knee-L CT wo contrast: No results found for this or any previous visit.  Knee-R DG 1-2 views: No results found for this or any previous visit.  Knee-L DG 1-2 views: No results found for this or any previous visit.  Knee-R DG 3 views: No results found for this or any previous visit.  Knee-L DG 3 views: No results found for this or any previous visit.  Knee-R  DG 4 views: No results found for this or any previous visit.  Knee-L DG 4 views: No results found for this or any previous visit.  Knee-R DG Arthrogram: No results found for this or any previous visit.  Knee-L DG Arthrogram: No results found for this or any previous visit.   Ankle Imaging: Ankle-R DG Complete: No results found for this or any previous visit.  Ankle-L DG Complete: No results found for this or any previous visit.   Foot Imaging: Foot-R DG Complete: No results found for this or any previous visit.  Foot-L DG Complete: No results found for this or any previous visit.   Elbow Imaging: Elbow-R DG Complete: No results found for this or any previous visit.  Elbow-L DG Complete: No results found for this or any previous visit.   Wrist Imaging: Wrist-R DG Complete: No results found for this or any previous visit.  Wrist-L DG Complete: No results found for this or any previous visit.   Hand Imaging: Hand-R DG Complete: No results found for this or any previous visit.  Hand-L DG Complete: No results found for this or any previous visit.   Complexity Note: Imaging results reviewed.                         ROS  Cardiovascular: {Hx; Cardiovascular History:210120525} Pulmonary or  Respiratory: {Hx; Pumonary and/or Respiratory History:210120523} Neurological: {Hx; Neurological:210120504} Psychological-Psychiatric: {Hx; Psychological-Psychiatric History:210120512} Gastrointestinal: {Hx; Gastrointestinal:210120527} Genitourinary: {Hx; Genitourinary:210120506} Hematological: {Hx; Hematological:210120510} Endocrine: {Hx; Endocrine history:210120509} Rheumatologic: {Hx; Rheumatological:210120530} Musculoskeletal: {Hx; Musculoskeletal:210120528} Work History: {Hx; Work history:210120514}  Allergies  Ms. Livingston is allergic to ibuprofen .  Laboratory Chemistry Profile   Renal Lab Results  Component Value Date   BUN 11 03/11/2024   CREATININE 0.59 03/11/2024    LABCREA 71.5 03/11/2024   BCR 19 03/11/2024   GFRAA 118 12/24/2019   GFRNONAA 103 12/24/2019   SPECGRAV 1.010 01/28/2019   PHUR 5.5 01/28/2019   PROTEINUR NEGATIVE 05/15/2018     Electrolytes Lab Results  Component Value Date   NA 138 03/11/2024   K 4.4 03/11/2024   CL 102 03/11/2024   CALCIUM 10.3 (H) 03/11/2024     Hepatic Lab Results  Component Value Date   AST 14 03/11/2024   ALT 18 03/11/2024   ALBUMIN 4.6 03/11/2024   ALKPHOS 33 (L) 03/11/2024     ID No results found for: "LYMEIGGIGMAB", "HIV", "SARSCOV2NAA", "STAPHAUREUS", "MRSAPCR", "HCVAB", "PREGTESTUR", "RMSFIGG", "QFVRPH1IGG", "QFVRPH2IGG"   Bone No results found for: "VD25OH", "VD125OH2TOT", "ZO1096EA5", "WU9811BJ4", "25OHVITD1", "25OHVITD2", "25OHVITD3", "TESTOFREE", "TESTOSTERONE"   Endocrine Lab Results  Component Value Date   GLUCOSE 209 (H) 03/11/2024   GLUCOSEU >=500 (A) 05/15/2018   HGBA1C 9.2 (A) 03/11/2024   TSH 1.620 09/12/2018     Neuropathy Lab Results  Component Value Date   HGBA1C 9.2 (A) 03/11/2024     CNS No results found for: "COLORCSF", "APPEARCSF", "RBCCOUNTCSF", "WBCCSF", "POLYSCSF", "LYMPHSCSF", "EOSCSF", "PROTEINCSF", "GLUCCSF", "JCVIRUS", "CSFOLI", "IGGCSF", "LABACHR", "ACETBL"   Inflammation (CRP: Acute  ESR: Chronic) No results found for: "CRP", "ESRSEDRATE", "LATICACIDVEN"   Rheumatology Lab Results  Component Value Date   LABURIC 5.6 03/22/2008     Coagulation Lab Results  Component Value Date   PLT 408 03/11/2024     Cardiovascular Lab Results  Component Value Date   HGB 14.4 03/11/2024   HCT 44.4 03/11/2024     Screening No results found for: "SARSCOV2NAA", "COVIDSOURCE", "STAPHAUREUS", "MRSAPCR", "HCVAB", "HIV", "PREGTESTUR"   Cancer No results found for: "CEA", "CA125", "LABCA2"   Allergens No results found for: "ALMOND", "APPLE", "ASPARAGUS", "AVOCADO", "BANANA", "BARLEY", "BASIL", "BAYLEAF", "GREENBEAN", "LIMABEAN", "WHITEBEAN", "BEEFIGE",  "REDBEET", "BLUEBERRY", "BROCCOLI", "CABBAGE", "MELON", "CARROT", "CASEIN", "CASHEWNUT", "CAULIFLOWER", "CELERY"     Note: Lab results reviewed.  PFSH  Drug: Ms. Wheelwright  reports no history of drug use. Alcohol:  reports current alcohol use. Tobacco:  reports that she has never smoked. She has never used smokeless tobacco. Medical:  has a past medical history of Asthma, Diabetes mellitus without complication (HCC), Hypertension, and Peritonsillar abscess. Family: family history includes CVA in her brother; Diabetes in her father and mother; Hypertension in her father and mother.  Past Surgical History:  Procedure Laterality Date   TUBAL LIGATION     Active Ambulatory Problems    Diagnosis Date Noted   Type 2 diabetes mellitus without complication, without long-term current use of insulin  (HCC) 05/15/2018   Spondylosis without myelopathy or radiculopathy, cervical region 02/12/2019   Class 3 severe obesity due to excess calories with serious comorbidity and body mass index (BMI) of 40.0 to 44.9 in adult 09/20/2021   Arthropathy of cervical facet joint 04/22/2024   Ossification of posterior longitudinal ligament (HCC) 04/22/2024   Spinal stenosis in cervical region 04/22/2024   Chronic pain syndrome 04/22/2024   Pharmacologic therapy 04/22/2024   Disorder of skeletal system 04/22/2024   Problems influencing  health status 04/22/2024   Abnormal MRI, cervical spine (10/15/2023) 04/22/2024   Resolved Ambulatory Problems    Diagnosis Date Noted   No Resolved Ambulatory Problems   Past Medical History:  Diagnosis Date   Asthma    Diabetes mellitus without complication (HCC)    Hypertension    Peritonsillar abscess    Constitutional Exam  General appearance: Well nourished, well developed, and well hydrated. In no apparent acute distress There were no vitals filed for this visit. BMI Assessment: Estimated body mass index is 44.97 kg/m as calculated from the following:   Height as  of 03/11/24: 5\' 4"  (1.626 m).   Weight as of 03/11/24: 262 lb (118.8 kg).  BMI interpretation table: BMI level Category Range association with higher incidence of chronic pain  <18 kg/m2 Underweight   18.5-24.9 kg/m2 Ideal body weight   25-29.9 kg/m2 Overweight Increased incidence by 20%  30-34.9 kg/m2 Obese (Class I) Increased incidence by 68%  35-39.9 kg/m2 Severe obesity (Class II) Increased incidence by 136%  >40 kg/m2 Extreme obesity (Class III) Increased incidence by 254%   Patient's current BMI Ideal Body weight  There is no height or weight on file to calculate BMI. Patient weight not recorded   BMI Readings from Last 4 Encounters:  03/11/24 44.97 kg/m  09/06/23 44.29 kg/m  12/14/22 44.53 kg/m  03/13/22 46.52 kg/m   Wt Readings from Last 4 Encounters:  03/11/24 262 lb (118.8 kg)  09/06/23 258 lb (117 kg)  12/14/22 259 lb 6.4 oz (117.7 kg)  03/13/22 271 lb (122.9 kg)    Psych/Mental status: Alert, oriented x 3 (person, place, & time)       Eyes: PERLA Respiratory: No evidence of acute respiratory distress  Assessment  Primary Diagnosis & Pertinent Problem List: The primary encounter diagnosis was Chronic pain syndrome. Diagnoses of Pharmacologic therapy, Disorder of skeletal system, and Problems influencing health status were also pertinent to this visit.  Visit Diagnosis (New problems to examiner): 1. Chronic pain syndrome   2. Pharmacologic therapy   3. Disorder of skeletal system   4. Problems influencing health status    Plan of Care (Initial workup plan)  Note: Ms. Politte was reminded that as per protocol, today's visit has been an evaluation only. We have not taken over the patient's controlled substance management.  Problem-specific plan: Assessment and Plan            Lab Orders  No laboratory test(s) ordered today   Imaging Orders  No imaging studies ordered today   Referral Orders  No referral(s) requested today   Procedure Orders     No procedure(s) ordered today   Pharmacotherapy (current): Medications ordered:  No orders of the defined types were placed in this encounter.  Medications administered during this visit: Elaine Marquez had no medications administered during this visit.   Analgesic Pharmacotherapy:  Opioid Analgesics: For patients currently taking or requesting to take opioid analgesics, in accordance with [place]  Medical Board Guidelines, we will assess their risks and indications for the use of these substances. After completing our evaluation, we may offer recommendations, but we no longer take patients for medication management. The prescribing physician will ultimately decide, based on his/her training and level of comfort whether to adopt any of the recommendations, including whether or not to prescribe such medicines.  Membrane stabilizer: To be determined at a later time  Muscle relaxant: To be determined at a later time  NSAID: To be determined at a later  time  Other analgesic(s): To be determined at a later time   Interventional management options: Ms. Polizzi was informed that there is no guarantee that she would be a candidate for interventional therapies. The decision will be based on the results of diagnostic studies, as well as Ms. Weider's risk profile.  Procedure(s) under consideration:  Pending results of ordered studies     Interventional Therapies  Risk Factors  Considerations  Medical Comorbidities:     Planned  Pending:      Under consideration:   Pending   Completed: (Analgesic benefit)1  None at this time   Therapeutic  Palliative (PRN) options:   None established   Completed by other providers:   None reported  1(Analgesic benefit): Expressed in percentage (%). (Local anesthetic[LA] +/- sedation  L.A.Local Anesthetic  Steroid benefit  Ongoing benefit)   Provider-requested follow-up: No follow-ups on file.  Future Appointments  Date Time  Provider Department Center  05/26/2024  2:00 PM Renaldo Caroli, MD [facility] None  07/11/2024  2:30 PM Lawrance Presume, MD [facility] None   I discussed the assessment and treatment plan with the patient. The patient was provided an opportunity to ask questions and all were answered. The patient agreed with the plan and demonstrated an understanding of the instructions.  Patient advised to call back or seek an in-person evaluation if the symptoms or condition worsens.  Duration of encounter: *** minutes.  Total time on encounter, as per AMA guidelines included both the face-to-face and non-face-to-face time personally spent by the physician and/or other qualified health care professional(s) on the day of the encounter (includes time in activities that require the physician or other qualified health care professional and does not include time in activities normally performed by clinical staff). Physician's time may include the following activities when performed: Preparing to see the patient (e.g., pre-charting review of records, searching for previously ordered imaging, lab work, and nerve conduction tests) Review of prior analgesic pharmacotherapies. Reviewing PMP Interpreting ordered tests (e.g., lab work, imaging, nerve conduction tests) Performing post-procedure evaluations, including interpretation of diagnostic procedures Obtaining and/or reviewing separately obtained history Performing a medically appropriate examination and/or evaluation Counseling and educating the patient/family/caregiver Ordering medications, tests, or procedures Referring and communicating with other health care professionals (when not separately reported) Documenting clinical information in the electronic or other health record Independently interpreting results (not separately reported) and communicating results to the patient/ family/caregiver Care coordination (not separately reported)  Note by: Candi Chafe, MD (TTS and AI technology used. I apologize for any typographical errors that were not detected and corrected.) Date: 05/26/2024; Time: 12:25 PM

## 2024-05-25 NOTE — Patient Instructions (Signed)

## 2024-05-26 ENCOUNTER — Ambulatory Visit: Attending: Pain Medicine | Admitting: Pain Medicine

## 2024-05-26 ENCOUNTER — Encounter: Payer: Self-pay | Admitting: Pain Medicine

## 2024-05-26 VITALS — BP 123/77 | HR 109 | Temp 97.6°F | Resp 16 | Ht 64.75 in | Wt 260.0 lb

## 2024-05-26 DIAGNOSIS — F339 Major depressive disorder, recurrent, unspecified: Secondary | ICD-10-CM | POA: Diagnosis not present

## 2024-05-26 DIAGNOSIS — M545 Low back pain, unspecified: Secondary | ICD-10-CM | POA: Insufficient documentation

## 2024-05-26 DIAGNOSIS — M5459 Other low back pain: Secondary | ICD-10-CM

## 2024-05-26 DIAGNOSIS — G5602 Carpal tunnel syndrome, left upper limb: Secondary | ICD-10-CM | POA: Diagnosis not present

## 2024-05-26 DIAGNOSIS — M542 Cervicalgia: Secondary | ICD-10-CM

## 2024-05-26 DIAGNOSIS — Z789 Other specified health status: Secondary | ICD-10-CM

## 2024-05-26 DIAGNOSIS — G894 Chronic pain syndrome: Secondary | ICD-10-CM

## 2024-05-26 DIAGNOSIS — R937 Abnormal findings on diagnostic imaging of other parts of musculoskeletal system: Secondary | ICD-10-CM

## 2024-05-26 DIAGNOSIS — G8929 Other chronic pain: Secondary | ICD-10-CM | POA: Diagnosis not present

## 2024-05-26 DIAGNOSIS — M899 Disorder of bone, unspecified: Secondary | ICD-10-CM | POA: Diagnosis not present

## 2024-05-26 DIAGNOSIS — Z79899 Other long term (current) drug therapy: Secondary | ICD-10-CM

## 2024-05-26 NOTE — Progress Notes (Signed)
 Safety precautions to be maintained throughout the outpatient stay will include: orient to surroundings, keep bed in low position, maintain call bell within reach at all times, provide assistance with transfer out of bed and ambulation.

## 2024-05-27 DIAGNOSIS — F339 Major depressive disorder, recurrent, unspecified: Secondary | ICD-10-CM | POA: Diagnosis not present

## 2024-06-02 DIAGNOSIS — F339 Major depressive disorder, recurrent, unspecified: Secondary | ICD-10-CM | POA: Diagnosis not present

## 2024-06-02 LAB — MAGNESIUM: Magnesium: 1.7 mg/dL (ref 1.6–2.3)

## 2024-06-02 LAB — 25-HYDROXY VITAMIN D LCMS D2+D3
25-Hydroxy, Vitamin D-2: <1 ng/mL
25-Hydroxy, Vitamin D-3: 22 ng/mL
25-Hydroxy, Vitamin D: 22 ng/mL — ABNORMAL LOW

## 2024-06-02 LAB — VITAMIN B12: Vitamin B-12: 761 pg/mL (ref 232–1245)

## 2024-06-02 LAB — C-REACTIVE PROTEIN: CRP: 5 mg/L (ref 0–10)

## 2024-06-02 LAB — SEDIMENTATION RATE: Sed Rate: 46 mm/h — ABNORMAL HIGH (ref 0–40)

## 2024-06-03 DIAGNOSIS — F339 Major depressive disorder, recurrent, unspecified: Secondary | ICD-10-CM | POA: Diagnosis not present

## 2024-06-04 ENCOUNTER — Ambulatory Visit
Admission: RE | Admit: 2024-06-04 | Discharge: 2024-06-04 | Disposition: A | Discharge status: Home / Self Care | Attending: Pain Medicine | Admitting: Pain Medicine

## 2024-06-04 ENCOUNTER — Ambulatory Visit
Admission: RE | Admit: 2024-06-04 | Discharge: 2024-06-04 | Disposition: A | Discharge status: Home / Self Care | Source: Ambulatory Visit | Attending: Pain Medicine | Admitting: Pain Medicine

## 2024-06-04 DIAGNOSIS — R937 Abnormal findings on diagnostic imaging of other parts of musculoskeletal system: Secondary | ICD-10-CM

## 2024-06-04 DIAGNOSIS — M545 Low back pain, unspecified: Secondary | ICD-10-CM | POA: Diagnosis not present

## 2024-06-04 DIAGNOSIS — M419 Scoliosis, unspecified: Secondary | ICD-10-CM | POA: Diagnosis not present

## 2024-06-04 DIAGNOSIS — G8929 Other chronic pain: Secondary | ICD-10-CM | POA: Diagnosis not present

## 2024-06-04 DIAGNOSIS — M542 Cervicalgia: Secondary | ICD-10-CM | POA: Diagnosis not present

## 2024-06-04 DIAGNOSIS — M47816 Spondylosis without myelopathy or radiculopathy, lumbar region: Secondary | ICD-10-CM | POA: Diagnosis not present

## 2024-06-08 NOTE — Progress Notes (Unsigned)
 PROVIDER NOTE: Interpretation of information contained herein should be left to medically-trained personnel. Specific patient instructions are provided elsewhere under Patient Instructions section of medical record. This document was created in part using AI and STT-dictation technology, any transcriptional errors that may result from this process are unintentional.  Patient: Elaine Marquez  Service: E/M   PCP: Vicci Barnie NOVAK, MD  DOB: 08-24-1971  DOS: 06/09/2024  Provider: Eric DELENA Como, MD  MRN: [id number]  Delivery: Face-to-face  Specialty: Interventional Pain Management  Type: Established Patient  Setting: Ambulatory outpatient facility  Specialty designation: 09  Referring Prov.: Vicci Barnie NOVAK, MD  Location: Outpatient office facility       Primary Reason(s) for Visit: Encounter for evaluation before starting new chronic pain management plan of care (Level of risk: moderate) CC: No chief complaint on file.  HPI  Elaine Marquez is a 53 y.o. year old, female patient, who comes today for a follow-up evaluation to review the test results and decide on a treatment plan. She has Type 2 diabetes mellitus without complication, without long-term current use of insulin  (HCC); Spondylosis without myelopathy or radiculopathy, cervical region; Class 3 severe obesity due to excess calories with serious comorbidity and body mass index (BMI) of 40.0 to 44.9 in adult; Arthropathy of cervical facet joint; Ossification of posterior longitudinal ligament (HCC); Spinal stenosis in cervical region; Chronic pain syndrome; Pharmacologic therapy; Disorder of skeletal system; Problems influencing health status; Abnormal MRI, cervical spine (10/15/2023); Cervicalgia; Chronic neck pain (1ry area of Pain); Chronic low back pain (2ry area of Pain) (Bilateral) w/o sciatica; Carpal tunnel syndrome (Left); Lumbar facet joint pain; and Cervical facet joint pain on their problem list. Her primarily concern today is  the No chief complaint on file.  Pain Assessment: Location:     Radiating:   Onset:   Duration:   Quality:   Severity:  /10 (subjective, self-reported pain score)  Effect on ADL:   Timing:   Modifying factors:   BP:    HR:    Elaine Marquez comes in today for a follow-up visit after her initial evaluation on 05/26/2024. Today we went over the results of her tests. These were explained in Layman's terms. During today's appointment we went over my diagnostic impression, as well as the proposed treatment plan.  ***: Elaine Marquez is a 53 year old female who presents with worsening chronic neck pain. She was referred by her husband, who is a patient of the provider.   She has experienced extreme neck pain for several years, which has progressively worsened over the past 7-8 years. The pain is primarily located in the back of the neck, particularly on the right side, and does not improve with rest. She has received facet joint injections, which provided minimal relief. The pain is described as constant and severe, often requiring her to use a heating pad or hot shower for temporary relief. No headaches are associated with her neck pain, although she has experienced some headaches in the past few days.   She experiences occasional numbness in the index, middle, and ring fingers of her left hand. These symptoms do not extend beyond the fingers and occur sporadically, potentially related to her cooking activities.   Her current medication regimen includes tramadol  50 mg, taken up to four times daily as needed for pain, despite causing itching, which she manages with Benadryl. She has also used oxycodone  and Vicodin in the past without similar side effects.   She has a history  of diabetes, previously poorly controlled with an A1c of 14, now improved to around 8 with metformin  and insulin . She monitors her blood sugar levels, which are generally under 300 mg/dL, with recent improvements noted.    She reports lower back pain that is less severe than her neck pain and improves with rest. She has experienced weight loss, which she believes has alleviated previous shooting pain down her leg, which did not reach her foot. No current leg pain.  ***  Discussed the use of AI scribe software for clinical note transcription with the patient, who gave verbal consent to proceed.  History of Present Illness          Patient presented with interventional treatment options. Elaine Marquez was informed that I will not be providing medication management. Pharmacotherapy evaluation including recommendations may be offered, if specifically requested.   Controlled Substance Pharmacotherapy Assessment REMS (Risk Evaluation and Mitigation Strategy)  Opioid Analgesic: Tramadol  Hcl 50 Mg Tablet  MME/day: 40 mg/day   Pill Count: None expected due to no prior prescriptions written by our practice. No notes on file  Pharmacokinetics: Liberation and absorption (onset of action): WNL Distribution (time to peak effect): WNL Metabolism and excretion (duration of action): WNL         Pharmacodynamics: Desired effects: Analgesia: Elaine Marquez reports >50% benefit. Functional ability: Patient reports that medication allows her to accomplish basic ADLs Clinically meaningful improvement in function (CMIF): Sustained CMIF goals met Perceived effectiveness: Described as relatively effective, allowing for increase in activities of daily living (ADL) Undesirable effects: Side-effects or Adverse reactions: None reported Monitoring: [place] PMP: PDMP reviewed during this encounter. Online review of the past 72-month period previously conducted. Not applicable at this point since we have not taken over the patient's medication management yet. List of other Serum/Urine Drug Screening Test(s):  Lab Results  Component Value Date   CANNABQUANT Negative 03/11/2024   List of all UDS test(s) done:  Lab Results  Component  Value Date   SUMMARY FINAL 05/26/2024   Last UDS on record: Summary  Date Value Ref Range Status  05/26/2024 FINAL  Final    Comment:    ==================================================================== Compliance Drug Analysis, Ur ==================================================================== Test                             Result       Flag       Units  Drug Present and Declared for Prescription Verification   Tramadol                        >4348        EXPECTED   ng/mg creat   O-Desmethyltramadol            >4348        EXPECTED   ng/mg creat   N-Desmethyltramadol            2836         EXPECTED   ng/mg creat    Source of tramadol  is a prescription medication. O-desmethyltramadol    and N-desmethyltramadol are expected metabolites of tramadol .    Gabapentin                      PRESENT      EXPECTED   Methocarbamol                   PRESENT  EXPECTED  Drug Present not Declared for Prescription Verification   Noroxycodone                   162          UNEXPECTED ng/mg creat    Noroxycodone is an expected metabolite of oxycodone . Sources of    oxycodone  include scheduled prescription medications.    Pregabalin                     PRESENT      UNEXPECTED   Acetaminophen                   PRESENT      UNEXPECTED   Diphenhydramine                PRESENT      UNEXPECTED  Drug Absent but Declared for Prescription Verification   Diclofenac                      Not Detected UNEXPECTED    Topical diclofenac , as indicated in the declared medication list, is    not always detected even when used as directed.  ==================================================================== Test                      Result    Flag   Units      Ref Range   Creatinine              115              mg/dL      >=79 ==================================================================== Declared Medications:  The flagging and interpretation on this report are based on the  following declared  medications.  Unexpected results may arise from  inaccuracies in the declared medications.   **Note: The testing scope of this panel includes these medications:   Gabapentin  (Neurontin )  Methocarbamol  (Robaxin )  Tramadol  (Ultram )   **Note: The testing scope of this panel does not include small to  moderate amounts of these reported medications:   Topical Diclofenac  (Voltaren )   **Note: The testing scope of this panel does not include the  following reported medications:   Amlodipine  (Norvasc )  Formoterol  (Dulera )  Insulin  (Basaglar )  Losartan  (Cozaar )  Metformin  (Glucophage )  Mometasone  (Dulera )  Semaglutide  ==================================================================== For clinical consultation, please call [phone]. ====================================================================    UDS interpretation: No unexpected findings.          Medication Assessment Form: Not applicable. No opioids. Treatment compliance: Not applicable Risk Assessment Profile: Aberrant behavior: See initial evaluations. None observed or detected today Comorbid factors increasing risk of overdose: See initial evaluation. No additional risks detected today Opioid risk tool (ORT):     05/26/2024    2:20 PM  Opioid Risk   Alcohol 0  Illegal Drugs 0  Rx Drugs 0  Alcohol 0  Illegal Drugs 0  Rx Drugs 0  Age between 16-45 years  0  Psychological Disease 0  Depression 0  Opioid Risk Tool Scoring 0  Opioid Risk Interpretation Low Risk    ORT Scoring interpretation table:  Score <3 = Low Risk for SUD  Score between 4-7 = Moderate Risk for SUD  Score >8 = High Risk for Opioid Abuse   Risk of substance use disorder (SUD): Low  Risk Mitigation Strategies:  Patient opioid safety counseling: No controlled substances prescribed. Patient-Prescriber Agreement (PPA): No agreement signed.  Controlled substance notification to other providers: None required. No opioid  therapy.  Pharmacologic Plan: Non-opioid analgesic therapy offered. Interventional alternatives discussed.             Laboratory Chemistry Profile   Renal Lab Results  Component Value Date   BUN 11 03/11/2024   CREATININE 0.59 03/11/2024   LABCREA 71.5 03/11/2024   BCR 19 03/11/2024   GFRAA 118 12/24/2019   GFRNONAA 103 12/24/2019   SPECGRAV 1.010 01/28/2019   PHUR 5.5 01/28/2019   PROTEINUR NEGATIVE 05/15/2018     Electrolytes Lab Results  Component Value Date   NA 138 03/11/2024   K 4.4 03/11/2024   CL 102 03/11/2024   CALCIUM 10.3 (H) 03/11/2024   MG 1.7 05/26/2024     Hepatic Lab Results  Component Value Date   AST 14 03/11/2024   ALT 18 03/11/2024   ALBUMIN 4.6 03/11/2024   ALKPHOS 33 (L) 03/11/2024     ID No results found for: LYMEIGGIGMAB, HIV, SARSCOV2NAA, STAPHAUREUS, MRSAPCR, HCVAB, PREGTESTUR, RMSFIGG, QFVRPH1IGG, QFVRPH2IGG   Bone Lab Results  Component Value Date   25OHVITD1 22 (L) 05/26/2024   25OHVITD2 <1.0 05/26/2024   25OHVITD3 22 05/26/2024     Endocrine Lab Results  Component Value Date   GLUCOSE 209 (H) 03/11/2024   GLUCOSEU >=500 (A) 05/15/2018   HGBA1C 9.2 (A) 03/11/2024   TSH 1.620 09/12/2018     Neuropathy Lab Results  Component Value Date   VITAMINB12 761 05/26/2024   HGBA1C 9.2 (A) 03/11/2024     CNS No results found for: COLORCSF, APPEARCSF, RBCCOUNTCSF, WBCCSF, POLYSCSF, LYMPHSCSF, EOSCSF, PROTEINCSF, GLUCCSF, JCVIRUS, CSFOLI, IGGCSF, LABACHR, ACETBL   Inflammation (CRP: Acute  ESR: Chronic) Lab Results  Component Value Date   CRP 5 05/26/2024   ESRSEDRATE 46 (H) 05/26/2024     Rheumatology Lab Results  Component Value Date   LABURIC 5.6 03/22/2008     Coagulation Lab Results  Component Value Date   PLT 408 03/11/2024     Cardiovascular Lab Results  Component Value Date   HGB 14.4 03/11/2024   HCT 44.4 03/11/2024     Screening No results found for:  SARSCOV2NAA, COVIDSOURCE, STAPHAUREUS, MRSAPCR, HCVAB, HIV, PREGTESTUR   Cancer No results found for: CEA, CA125, LABCA2   Allergens No results found for: ALMOND, APPLE, ASPARAGUS, AVOCADO, BANANA, BARLEY, BASIL, BAYLEAF, GREENBEAN, LIMABEAN, WHITEBEAN, BEEFIGE, REDBEET, BLUEBERRY, BROCCOLI, CABBAGE, MELON, CARROT, CASEIN, CASHEWNUT, CAULIFLOWER, CELERY     Note: Lab results reviewed.  Recent Diagnostic Imaging Review  Cervical Imaging: Cervical MR wo contrast: Results for orders placed during the hospital encounter of 09/24/23 MR CERVICAL SPINE WO CONTRAST  Narrative CLINICAL DATA:  Cervical stenosis  EXAM: MRI CERVICAL SPINE WITHOUT CONTRAST  TECHNIQUE: Multiplanar, multisequence MR imaging of the cervical spine was performed. No intravenous contrast was administered.  COMPARISON:  09/18/2020  FINDINGS: Alignment: No static listhesis. Loss of the normal cervical lordosis with straightening which may be positional versus secondary to muscle spasm.  Vertebrae: No acute fracture, evidence of discitis, or aggressive bone lesion.  Cord: Normal signal and morphology.  Posterior Fossa, vertebral arteries, paraspinal tissues: Posterior fossa demonstrates no focal abnormality. Vertebral artery flow voids are maintained. Paraspinal soft tissues are unremarkable.  Disc levels:  Discs: Disc spaces are maintained.  C2-3: Mild disc bulge. Moderate left foraminal stenosis. No right foraminal stenosis. No spinal stenosis.  C3-4: Moderate broad disc osteophyte complex contacting the ventral cervical spinal cord. Severe left foraminal stenosis. Moderate right foraminal stenosis. Mild spinal stenosis.  C4-5: Mild disc osteophyte complex with a more focal  broad right paracentral component flattening the ventral right paracentral cervical spinal cord. Moderate bilateral foraminal stenosis. Mild central canal  stenosis.  C5-6: Mild disc osteophyte complex. Moderate left and mild right foraminal stenosis. Mild spinal stenosis.  C6-7: Mild disc osteophyte complex. No foraminal or central canal stenosis.  C7-T1: Central disc osteophyte complex. No foraminal or central canal stenosis.  IMPRESSION: 1. At C3-4 there is a moderate broad disc osteophyte complex contacting the ventral cervical spinal cord. Severe left foraminal stenosis. Moderate right foraminal stenosis. Mild spinal stenosis. 2. At C4-5 there is a mild disc osteophyte complex with a more focal broad right paracentral component flattening the ventral right paracentral cervical spinal cord. Moderate bilateral foraminal stenosis. Mild central canal stenosis. 3. At C5-6 there is a mild disc osteophyte complex. Moderate left and mild right foraminal stenosis. Mild spinal stenosis. 4. No acute osseous injury of the cervical spine.   Electronically Signed By: Julaine Blanch M.D. On: 10/15/2023 15:29  Cervical CT wo contrast: Results for orders placed during the hospital encounter of 09/18/20 CT Cervical Spine Wo Contrast  Narrative CLINICAL DATA:  Fall last Thursday, pain  EXAM: CT CERVICAL SPINE WITHOUT CONTRAST  TECHNIQUE: Multidetector CT imaging of the cervical spine was performed without intravenous contrast. Multiplanar CT image reconstructions were also generated.  COMPARISON:  None.  FINDINGS: Examination of the cervical spine is somewhat limited by motion artifact and body habitus with underpenetration  Alignment: Normal.  Skull base and vertebrae: No acute fracture. No primary bone lesion or focal pathologic process.  Soft tissues and spinal canal: No prevertebral fluid or swelling. No visible canal hematoma.  Disc levels: Mild multilevel disc space height loss and osteophytosis.  Upper chest: Negative.  Other: None.  IMPRESSION: 1. Examination of the cervical spine is somewhat limited by  motion artifact and body habitus with underpenetration. Within this limitation, no fracture or static subluxation of the cervical spine. 2. Mild multilevel disc space height loss and osteophytosis.   Electronically Signed By: Marolyn Jaksch M.D. On: 09/18/2020 14:25  Lumbosacral Imaging: Lumbar DG Bending views: Results for orders placed during the hospital encounter of 06/04/24 DG Lumbar Spine Complete W/Bend  Narrative CLINICAL DATA:  Low back pain for several years.  EXAM: LUMBAR SPINE - COMPLETE WITH BENDING VIEWS  COMPARISON:  June 22, 2022  FINDINGS: There is no evidence of lumbar spine fracture. Scoliosis. Mild narrow intervertebral spaces with facet joint sclerosis noted in the lower lumbar spine. Mild anterior spurring noted at L2, L4 and L5. Flexion and extension views demonstrate no evidence instability.  IMPRESSION: Mild degenerative joint changes of lower lumbar spine.   Electronically Signed By: Craig Farr M.D. On: 06/04/2024 14:08  Complexity Note: Imaging results reviewed.                         Meds   Current Outpatient Medications:    Accu-Chek Softclix Lancets lancets, USE TO CHECK BLOOD SUGAR THREE TIMES DAILY AS DIRECTED, Disp: 100 each, Rfl: 0   amLODipine  (NORVASC ) 10 MG tablet, Take 1 tablet (10 mg total) by mouth daily., Disp: 90 tablet, Rfl: 1   Blood Glucose Monitoring Suppl (ACCU-CHEK GUIDE) w/Device KIT, Check blood sugars 3 times a day before meals., Disp: 1 kit, Rfl: 0   Continuous Glucose Receiver (FREESTYLE LIBRE 3 READER) DEVI, Use to check glucose continuously., Disp: 1 each, Rfl: 0   Continuous Glucose Sensor (FREESTYLE LIBRE 3 SENSOR) MISC, Place 1 sensor on the  skin every 14 days. Use to check glucose continuously, Disp: 2 each, Rfl: 6   diclofenac  Sodium (VOLTAREN ) 1 % GEL, APPLY TWO grams topically FOUR TIMES DAILY, Disp: 100 g, Rfl: 0   DULERA  100-5 MCG/ACT AERO, Inhale 2 puffs into the lungs 2 (two) times daily., Disp: 1 each,  Rfl: 6   gabapentin  (NEURONTIN ) 300 MG capsule, Take 1 capsule (300 mg total) by mouth at bedtime., Disp: 30 capsule, Rfl: 5   glucose blood (ACCU-CHEK GUIDE) test strip, USE TO check blood sugar three times daily, Disp: 100 strip, Rfl: 11   Insulin  Glargine (BASAGLAR  KWIKPEN) 100 UNIT/ML, Inject 23 Units into the skin daily., Disp: 15 mL, Rfl: 6   Insulin  Pen Needle (LITETOUCH PEN NEEDLES) 31G X 6 MM MISC, USE TO administer insulin  EVERY DAY, Disp: 100 each, Rfl: 0   Lancets Misc. (ACCU-CHEK FASTCLIX LANCET) KIT, Check blood glucose at least 3 times daily., Disp: 1 kit, Rfl: 6   losartan  (COZAAR ) 25 MG tablet, Take 1 tablet (25 mg total) by mouth daily., Disp: 90 tablet, Rfl: 1   metFORMIN  (GLUCOPHAGE -XR) 500 MG 24 hr tablet, TAKE ONE TABLET BY MOUTH EVERY MORNING AND TAKE TWO TABLETS BY MOUTH EVERY IN THE EVENING, Disp: 270 tablet, Rfl: 1   methocarbamol  (ROBAXIN ) 500 MG tablet, Take 1 tablet (500 mg total) by mouth 2 (two) times daily as needed for muscle spasms., Disp: 180 tablet, Rfl: 1   Semaglutide , 2 MG/DOSE, 8 MG/3ML SOPN, Inject 2 mg as directed once a week., Disp: 3 mL, Rfl: 3   traMADol  (ULTRAM ) 50 MG tablet, Take 1 tablet (50 mg total) by mouth daily as needed., Disp: 30 tablet, Rfl: 0  ROS  Constitutional: Denies any fever or chills Gastrointestinal: No reported hemesis, hematochezia, vomiting, or acute GI distress Musculoskeletal: Denies any acute onset joint swelling, redness, loss of ROM, or weakness Neurological: No reported episodes of acute onset apraxia, aphasia, dysarthria, agnosia, amnesia, paralysis, loss of coordination, or loss of consciousness  Allergies  Elaine Marquez is allergic to ibuprofen .  PFSH  Drug: Elaine Marquez  reports no history of drug use. Alcohol:  reports current alcohol use. Tobacco:  reports that she has never smoked. She has never used smokeless tobacco. Medical:  has a past medical history of Asthma, Diabetes mellitus without complication (HCC),  Hypertension, and Peritonsillar abscess. Surgical: Elaine Marquez  has a past surgical history that includes Tubal ligation. Family: family history includes CVA in her brother; Diabetes in her father and mother; Hypertension in her father and mother.  Constitutional Exam  General appearance: Well nourished, well developed, and well hydrated. In no apparent acute distress There were no vitals filed for this visit. BMI Assessment: Estimated body mass index is 43.6 kg/m as calculated from the following:   Height as of 05/26/24: 5' 4.75 (1.645 m).   Weight as of 05/26/24: 260 lb (117.9 kg).  BMI interpretation table: BMI level Category Range association with higher incidence of chronic pain  <18 kg/m2 Underweight   18.5-24.9 kg/m2 Ideal body weight   25-29.9 kg/m2 Overweight Increased incidence by 20%  30-34.9 kg/m2 Obese (Class I) Increased incidence by 68%  35-39.9 kg/m2 Severe obesity (Class II) Increased incidence by 136%  >40 kg/m2 Extreme obesity (Class III) Increased incidence by 254%   Patient's current BMI Ideal Body weight  There is no height or weight on file to calculate BMI. Ideal body weight: 56.4 kg (124 lb 6.3 oz) Adjusted ideal body weight: 81 kg (178 lb 10.2  oz)   BMI Readings from Last 4 Encounters:  05/26/24 43.60 kg/m  03/11/24 44.97 kg/m  09/06/23 44.29 kg/m  12/14/22 44.53 kg/m   Wt Readings from Last 4 Encounters:  05/26/24 260 lb (117.9 kg)  03/11/24 262 lb (118.8 kg)  09/06/23 258 lb (117 kg)  12/14/22 259 lb 6.4 oz (117.7 kg)    Psych/Mental status: Alert, oriented x 3 (person, place, & time)       Eyes: PERLA Respiratory: No evidence of acute respiratory distress  Assessment & Plan  Primary Diagnosis & Pertinent Problem List: The primary encounter diagnosis was Chronic neck pain (1ry area of Pain). Diagnoses of Cervicalgia, Cervical facet joint pain, Arthropathy of cervical facet joint, Spondylosis without myelopathy or radiculopathy, cervical region,  Spinal stenosis in cervical region, Abnormal MRI, cervical spine (10/15/2023), and Chronic low back pain (2ry area of Pain) (Bilateral) w/o sciatica were also pertinent to this visit. Visit Diagnosis: 1. Chronic neck pain (1ry area of Pain)   2. Cervicalgia   3. Cervical facet joint pain   4. Arthropathy of cervical facet joint   5. Spondylosis without myelopathy or radiculopathy, cervical region   6. Spinal stenosis in cervical region   7. Abnormal MRI, cervical spine (10/15/2023)   8. Chronic low back pain (2ry area of Pain) (Bilateral) w/o sciatica    Problems updated and reviewed during this visit: Problem  Abnormal MRI, cervical spine (10/15/2023)   (10/15/2023) CERVICAL MRI FINDINGS: Alignment: Loss of the normal cervical lordosis with straightening which may be positional versus secondary to muscle spasm.  DISC LEVELS: C2-3: Mild disc bulge. Moderate left foraminal stenosis.  C3-4: Moderate broad disc osteophyte complex contacting the ventral cervical spinal cord. Severe left foraminal stenosis. Moderate right foraminal stenosis. Mild spinal stenosis.   C4-5: Mild disc osteophyte complex with a more focal broad right paracentral component flattening the ventral right paracentral cervical spinal cord. Moderate bilateral foraminal stenosis. Mild central canal stenosis.   C5-6: Mild disc osteophyte complex. Moderate left and mild right foraminal stenosis. Mild spinal stenosis.   C6-7: Mild disc osteophyte complex. No foraminal or central canal stenosis.   C7-T1: Central disc osteophyte complex. No foraminal or central canal stenosis.   IMPRESSION: 1. At C3-4 there is a moderate broad disc osteophyte complex contacting the ventral cervical spinal cord. Severe left foraminal stenosis. Moderate right foraminal stenosis. Mild spinal stenosis. 2. At C4-5 there is a mild disc osteophyte complex with a more focal broad right paracentral component flattening the ventral  right paracentral cervical spinal cord. Moderate bilateral foraminal stenosis. Mild central canal stenosis. 3. At C5-6 there is a mild disc osteophyte complex. Moderate left and mild right foraminal stenosis. Mild spinal stenosis. 4. No acute osseous injury of the cervical spine.     Plan of Care  Assessment and Plan             Pharmacotherapy (Medications Ordered): No orders of the defined types were placed in this encounter.  Procedure Orders    No procedure(s) ordered today   Lab Orders  No laboratory test(s) ordered today   Imaging Orders  No imaging studies ordered today   Referral Orders  No referral(s) requested today    Pharmacological management:  Opioid Analgesics: I will not be prescribing any opioids at this time Membrane stabilizer: I will not be prescribing any at this time Muscle relaxant: I will not be prescribing any at this time NSAID: I will not be prescribing any at this time Other analgesic(s):  I will not be prescribing any at this time      Interventional Therapies  Risk Factors  Considerations  Medical Comorbidities:     Planned  Pending:      Under consideration:   Pending   Completed: (Analgesic benefit)1  None at this time   Therapeutic  Palliative (PRN) options:   None established   Completed by other providers:   Procedure Date  Non Fac Inj facet jnt/nrv, cerv/thor sngl Mar-03-2024  Non Fac Inj facet jnt/nrv, cerv/thor Second Level Mar-03-2024  Non Fac Inj facet jnt/nrv, cerv/thor sngl Feb-24-2025  Non Fac Inj facet jnt/nrv, cerv/thor Second Level Feb-24-2025  Non Fac Destruction By Neuro Agt. Cerv Or Abigail Achille Menghini [phone]  Non Fac Destruction Neuro Agt, Bradly Or Abigail Add Level Feb-13-2025  Non Fac Inj facet jnt/nrv, cerv/thor sngl Jan-23-2025  Non Fac Inj facet jnt/nrv, cerv/thor Second Level Jan-23-2025    1(Analgesic benefit): Expressed in percentage (%). (Local anesthetic[LA] +/- sedation  L.A.Local Anesthetic   Steroid benefit  Ongoing benefit)      Provider-requested follow-up: No follow-ups on file. Recent Visits Date Type Provider Dept  05/26/24 Office Visit Tanya Glisson, MD [facility]  Showing recent visits within past 90 days and meeting all other requirements Future Appointments Date Type Provider Dept  06/09/24 Appointment Tanya Glisson, MD [facility]  Showing future appointments within next 90 days and meeting all other requirements   Primary Care Physician: Vicci Barnie NOVAK, MD  Duration of encounter: *** minutes.  Total time on encounter, as per AMA guidelines included both the face-to-face and non-face-to-face time personally spent by the physician and/or other qualified health care professional(s) on the day of the encounter (includes time in activities that require the physician or other qualified health care professional and does not include time in activities normally performed by clinical staff). Physician's time may include the following activities when performed: Preparing to see the patient (e.g., pre-charting review of records, searching for previously ordered imaging, lab work, and nerve conduction tests) Review of prior analgesic pharmacotherapies. Reviewing PMP Interpreting ordered tests (e.g., lab work, imaging, nerve conduction tests) Performing post-procedure evaluations, including interpretation of diagnostic procedures Obtaining and/or reviewing separately obtained history Performing a medically appropriate examination and/or evaluation Counseling and educating the patient/family/caregiver Ordering medications, tests, or procedures Referring and communicating with other health care professionals (when not separately reported) Documenting clinical information in the electronic or other health record Independently interpreting results (not separately reported) and communicating results to the patient/ family/caregiver Care  coordination (not separately reported)  Note by: Glisson DELENA Tanya, MD (TTS technology used. I apologize for any typographical errors that were not detected and corrected.) Date: 06/09/2024; Time: 3:02 PM

## 2024-06-09 ENCOUNTER — Encounter: Payer: Self-pay | Admitting: Pain Medicine

## 2024-06-09 ENCOUNTER — Ambulatory Visit: Attending: Pain Medicine | Admitting: Pain Medicine

## 2024-06-09 VITALS — BP 122/77 | HR 102 | Temp 96.6°F | Ht 64.0 in | Wt 260.0 lb

## 2024-06-09 DIAGNOSIS — M545 Low back pain, unspecified: Secondary | ICD-10-CM | POA: Insufficient documentation

## 2024-06-09 DIAGNOSIS — M47812 Spondylosis without myelopathy or radiculopathy, cervical region: Secondary | ICD-10-CM | POA: Insufficient documentation

## 2024-06-09 DIAGNOSIS — R937 Abnormal findings on diagnostic imaging of other parts of musculoskeletal system: Secondary | ICD-10-CM | POA: Insufficient documentation

## 2024-06-09 DIAGNOSIS — M4802 Spinal stenosis, cervical region: Secondary | ICD-10-CM | POA: Diagnosis not present

## 2024-06-09 DIAGNOSIS — M542 Cervicalgia: Secondary | ICD-10-CM | POA: Diagnosis not present

## 2024-06-09 DIAGNOSIS — G8929 Other chronic pain: Secondary | ICD-10-CM | POA: Diagnosis not present

## 2024-06-09 DIAGNOSIS — F339 Major depressive disorder, recurrent, unspecified: Secondary | ICD-10-CM | POA: Diagnosis not present

## 2024-06-09 MED ORDER — KETOROLAC TROMETHAMINE 60 MG/2ML IM SOLN
60.0000 mg | Freq: Once | INTRAMUSCULAR | Status: AC
  Administered 2024-06-09: 60 mg via INTRAMUSCULAR

## 2024-06-09 MED ORDER — KETOROLAC TROMETHAMINE 60 MG/2ML IM SOLN
INTRAMUSCULAR | Status: AC
  Filled 2024-06-09: qty 2

## 2024-06-09 MED ORDER — METHOCARBAMOL 1000 MG/10ML IJ SOLN
INTRAMUSCULAR | Status: AC
  Filled 2024-06-09: qty 10

## 2024-06-09 MED ORDER — METHOCARBAMOL 1000 MG/10ML IJ SOLN
200.0000 mg | Freq: Once | INTRAMUSCULAR | Status: AC
  Administered 2024-06-09: 200 mg via INTRAMUSCULAR

## 2024-06-09 NOTE — Progress Notes (Signed)
 Safety precautions to be maintained throughout the outpatient stay will include: orient to surroundings, keep bed in low position, maintain call bell within reach at all times, provide assistance with transfer out of bed and ambulation.

## 2024-06-09 NOTE — Patient Instructions (Addendum)
 ______________________________________________________________________    Procedure instructions  Stop blood-thinners  Do not eat or drink fluids (other than water) for 6 hours before your procedure  No water for 2 hours before your procedure  Take your blood pressure medicine with a sip of water  Arrive 30 minutes before your appointment  If sedation is planned, bring suitable driver. [place], [place], & public transportation are NOT APPROVED)  Carefully read the Preparing for your procedure detailed instructions  If you have questions call us  at [phone]  Procedure appointments are for procedures only.   NO medication refills or new problem evaluations will be done on procedure days.   Only the scheduled, pre-approved procedure and side will be done.   ______________________________________________________________________      ______________________________________________________________________    Preparing for your procedure  Appointments: If you think you may not be able to keep your appointment, call 24-48 hours in advance to cancel. We need time to make it available to others.  Procedure visits are for procedures only. During your procedure appointment there will be: NO Prescription Refills*. NO medication changes or discussions*. NO discussion of disability issues*. NO unrelated pain problem evaluations*. NO evaluations to order other pain procedures*. *These will be addressed at a separate and distinct evaluation encounter on the provider's evaluation schedule and not during procedure days.  Instructions: Food intake: Avoid eating anything solid for at least 8 hours prior to your procedure. Clear liquid intake: You may take clear liquids such as water up to 2 hours prior to your procedure. (No carbonated drinks. No soda.) Transportation: Unless otherwise stated by your physician, bring a driver. (Driver cannot be a Market researcher, Pharmacist, community, or any other form of public  transportation.) Morning Medicines: Except for blood thinners, take all of your other morning medications with a sip of water. Make sure to take your heart and blood pressure medicines. If your blood pressure's lower number is above 100, the case will be rescheduled. Blood thinners: Make sure to stop your blood thinners as instructed.  If you take a blood thinner, but were not instructed to stop it, call our office [phone] and ask to talk to a nurse. Not stopping a blood thinner prior to certain procedures could lead to serious complications. Diabetics on insulin : Notify the staff so that you can be scheduled 1st case in the morning. If your diabetes requires high dose insulin , take only  of your normal insulin  dose the morning of the procedure and notify the staff that you have done so. Preventing infections: Shower with an antibacterial soap the morning of your procedure.  Build-up your immune system: Take 1000 mg of Vitamin C with every meal (3 times a day) the day prior to your procedure. Antibiotics: Inform the nursing staff if you are taking any antibiotics or if you have any conditions that may require antibiotics prior to procedures. (Example: recent joint implants)   Pregnancy: If you are pregnant make sure to notify the nursing staff. Not doing so may result in injury to the fetus, including death.  Sickness: If you have a cold, fever, or any active infections, call and cancel or reschedule your procedure. Receiving steroids while having an infection may result in complications. Arrival: You must be in the facility at least 30 minutes prior to your scheduled procedure. Tardiness: Your scheduled time is also the cutoff time. If you do not arrive at least 15 minutes prior to your procedure, you will be rescheduled.  Children: Do not bring any children  with you. Make arrangements to keep them home. Dress appropriately: There is always a possibility that your clothing may get soiled. Avoid  long dresses. Valuables: Do not bring any jewelry or valuables.  Reasons to call and reschedule or cancel your procedure: (Following these recommendations will minimize the risk of a serious complication.) Surgeries: Avoid having procedures within 2 weeks of any surgery. (Avoid for 2 weeks before or after any surgery). Flu Shots: Avoid having procedures within 2 weeks of a flu shots or . (Avoid for 2 weeks before or after immunizations). Barium: Avoid having a procedure within 7-10 days after having had a radiological study involving the use of radiological contrast. (Myelograms, Barium swallow or enema study). Heart attacks: Avoid any elective procedures or surgeries for the initial 6 months after a Myocardial Infarction (Heart Attack). Blood thinners: It is imperative that you stop these medications before procedures. Let us  know if you if you take any blood thinner.  Infection: Avoid procedures during or within two weeks of an infection (including chest colds or gastrointestinal problems). Symptoms associated with infections include: Localized redness, fever, chills, night sweats or profuse sweating, burning sensation when voiding, cough, congestion, stuffiness, runny nose, sore throat, diarrhea, nausea, vomiting, cold or Flu symptoms, recent or current infections. It is specially important if the infection is over the area that we intend to treat. Heart and lung problems: Symptoms that may suggest an active cardiopulmonary problem include: cough, chest pain, breathing difficulties or shortness of breath, dizziness, ankle swelling, uncontrolled high or unusually low blood pressure, and/or palpitations. If you are experiencing any of these symptoms, cancel your procedure and contact your primary care physician for an evaluation.  Remember:  Regular Business hours are:  Monday to Thursday 8:00 AM to 4:00 PM  Provider's Schedule: Eric Como, MD:  Procedure days: Tuesday and Thursday 7:30  AM to 4:00 PM  Wallie Sherry, MD:  Procedure days: Monday and Wednesday 7:30 AM to 4:00 PM Last  Updated: 11/27/2023 ______________________________________________________________________      ______________________________________________________________________    General Risks and Possible Complications  Patient Responsibilities: It is important that you read this as it is part of your informed consent. It is our duty to inform you of the risks and possible complications associated with treatments offered to you. It is your responsibility as a patient to read this and to ask questions about anything that is not clear or that you believe was not covered in this document.  Patient's Rights: You have the right to refuse treatment. You also have the right to change your mind, even after initially having agreed to have the treatment done. However, under this last option, if you wait until the last second to change your mind, you may be charged for the materials used up to that point.  Introduction: Medicine is not an Visual merchandiser. Everything in Medicine, including the lack of treatment(s), carries the potential for danger, harm, or loss (which is by definition: Risk). In Medicine, a complication is a secondary problem, condition, or disease that can aggravate an already existing one. All treatments carry the risk of possible complications. The fact that a side effects or complications occurs, does not imply that the treatment was conducted incorrectly. It must be clearly understood that these can happen even when everything is done following the highest safety standards.  No treatment: You can choose not to proceed with the proposed treatment alternative. The "PRO(s)" would include: avoiding the risk of complications associated with the therapy. The "CON(s)"  would include: not getting any of the treatment benefits. These benefits fall under one of three categories: diagnostic; therapeutic; and/or  palliative. Diagnostic benefits include: getting information which can ultimately lead to improvement of the disease or symptom(s). Therapeutic benefits are those associated with the successful treatment of the disease. Finally, palliative benefits are those related to the decrease of the primary symptoms, without necessarily curing the condition (example: decreasing the pain from a flare-up of a chronic condition, such as incurable terminal cancer).  General Risks and Complications: These are associated to most interventional treatments. They can occur alone, or in combination. They fall under one of the following six (6) categories: no benefit or worsening of symptoms; bleeding; infection; nerve damage; allergic reactions; and/or death. No benefits or worsening of symptoms: In Medicine there are no guarantees, only probabilities. No healthcare provider can ever guarantee that a medical treatment will work, they can only state the probability that it may. Furthermore, there is always the possibility that the condition may worsen, either directly, or indirectly, as a consequence of the treatment. Bleeding: This is more common if the patient is taking a blood thinner, either prescription or over the counter (example: Goody Powders, Fish oil, Aspirin, Garlic, etc.), or if suffering a condition associated with impaired coagulation (example: Hemophilia, cirrhosis of the liver, low platelet counts, etc.). However, even if you do not have one on these, it can still happen. If you have any of these conditions, or take one of these drugs, make sure to notify your treating physician. Infection: This is more common in patients with a compromised immune system, either due to disease (example: diabetes, cancer, human immunodeficiency virus [HIV], etc.), or due to medications or treatments (example: therapies used to treat cancer and rheumatological diseases). However, even if you do not have one on these, it can still  happen. If you have any of these conditions, or take one of these drugs, make sure to notify your treating physician. Nerve Damage: This is more common when the treatment is an invasive one, but it can also happen with the use of medications, such as those used in the treatment of cancer. The damage can occur to small secondary nerves, or to large primary ones, such as those in the spinal cord and brain. This damage may be temporary or permanent and it may lead to impairments that can range from temporary numbness to permanent paralysis and/or brain death. Allergic Reactions: Any time a substance or material comes in contact with our body, there is the possibility of an allergic reaction. These can range from a mild skin rash (contact dermatitis) to a severe systemic reaction (anaphylactic reaction), which can result in death. Death: In general, any medical intervention can result in death, most of the time due to an unforeseen complication. ______________________________________________________________________    ______________________________________________________________________    OTC Supplements:   The following is a list of over-the-counter (OTC) supplements that have been found to have [employer]) studies suggesting that they may be of some benefits when used in moderation in some chronic pain-related conditions.  NOTE:  Always consult with your primary care provider and/or pharmacist before taking any OTC medications to make sure they will not interact with your current medications. Always use manufacturer's recommended dosage.  Supplement Possible benefit May be of benefit in treatment of   Turmeric/curcumin anti-inflammatory Joint and muscle aches and pain.  Glucosamine/chondroitin (triple strength) may slow loss of articular cartilage Joint pain.  Vitamin D -3* may suppress  release of chemicals associated with inflammation. Increases tolerance to pain. Joint and  muscle aches and pain.   Moringa(+) anti-inflammatory with mild analgesic effects Joint and muscle aches and pain.  Melatonin(+) Helps reset sleep cycle. Insomnia.  Vitamin B-12* may help keep nerves and blood cells healthy as well as maintaining function of nervous system Nerve pain (Burning pain)  Alpha-Lipoic-Acid (ALA)* antioxidant that may help with nerve health, pain, and blocking the activation of some inflammatory chemicals Diabetic neuropathy and metabolic syndrome  superoxide dismutase (SOD)** Currently being reviewed.   Tiger Balm Currently being reviewed.   hydrolyzed collagen peptides* Currently being reviewed.  Collagen supplementation may increases bone strength, density, and mass; may improve joint stiffness/mobility, and functionality; and may reduce joint pain. Possible chondroprotective effects. May help with protection of joint health.   Methylsulfonylmethane (MSM)* Currently being reviewed.   CBD(+) Currently being reviewed.   Delta-8 THC(+) Currently being reviewed.   *  Generally Recognized As Safe (GRAS) approved substance.-FDA ([employer]) ** Possibly Safe, but not considered Generally Recognized As Safe (Not GRAS) by the [place]  Food and Drug Administration (FDA) as a food additive. (+) Not considered Generally Recognized As Safe (Not GRAS) by the [place]  Food and Drug Administration (FDA) as a food additive.  ______________________________________________________________________

## 2024-06-11 DIAGNOSIS — F339 Major depressive disorder, recurrent, unspecified: Secondary | ICD-10-CM | POA: Diagnosis not present

## 2024-06-18 DIAGNOSIS — F339 Major depressive disorder, recurrent, unspecified: Secondary | ICD-10-CM | POA: Diagnosis not present

## 2024-06-21 DIAGNOSIS — F339 Major depressive disorder, recurrent, unspecified: Secondary | ICD-10-CM | POA: Diagnosis not present

## 2024-06-24 ENCOUNTER — Ambulatory Visit
Admission: RE | Admit: 2024-06-24 | Discharge: 2024-06-24 | Disposition: A | Discharge status: Home / Self Care | Source: Ambulatory Visit | Attending: Pain Medicine | Admitting: Pain Medicine

## 2024-06-24 ENCOUNTER — Ambulatory Visit (HOSPITAL_BASED_OUTPATIENT_CLINIC_OR_DEPARTMENT_OTHER): Admitting: Pain Medicine

## 2024-06-24 ENCOUNTER — Encounter: Payer: Self-pay | Admitting: Pain Medicine

## 2024-06-24 VITALS — BP 136/80 | HR 112 | Temp 98.1°F | Resp 18 | Wt 260.0 lb

## 2024-06-24 DIAGNOSIS — M542 Cervicalgia: Secondary | ICD-10-CM | POA: Insufficient documentation

## 2024-06-24 DIAGNOSIS — M4802 Spinal stenosis, cervical region: Secondary | ICD-10-CM

## 2024-06-24 DIAGNOSIS — M47812 Spondylosis without myelopathy or radiculopathy, cervical region: Secondary | ICD-10-CM | POA: Insufficient documentation

## 2024-06-24 DIAGNOSIS — R937 Abnormal findings on diagnostic imaging of other parts of musculoskeletal system: Secondary | ICD-10-CM | POA: Insufficient documentation

## 2024-06-24 DIAGNOSIS — M2428 Disorder of ligament, vertebrae: Secondary | ICD-10-CM | POA: Diagnosis present

## 2024-06-24 DIAGNOSIS — G8929 Other chronic pain: Secondary | ICD-10-CM | POA: Diagnosis not present

## 2024-06-24 MED ORDER — IOHEXOL 180 MG/ML  SOLN
10.0000 mL | Freq: Once | INTRAMUSCULAR | Status: AC
  Administered 2024-06-24: 10 mL via EPIDURAL

## 2024-06-24 MED ORDER — SODIUM CHLORIDE (PF) 0.9 % IJ SOLN
INTRAMUSCULAR | Status: AC
  Filled 2024-06-24: qty 10

## 2024-06-24 MED ORDER — DEXAMETHASONE SODIUM PHOSPHATE 10 MG/ML IJ SOLN
10.0000 mg | Freq: Once | INTRAMUSCULAR | Status: AC
  Administered 2024-06-24: 10 mg

## 2024-06-24 MED ORDER — SODIUM CHLORIDE (PF) 0.9 % IJ SOLN
INTRAMUSCULAR | Status: AC
Start: 2024-06-24 — End: 2024-06-24
  Filled 2024-06-24: qty 10

## 2024-06-24 MED ORDER — FENTANYL CITRATE (PF) 100 MCG/2ML IJ SOLN: 25.0000 ug | INTRAMUSCULAR | Status: DC | PRN

## 2024-06-24 MED ORDER — FENTANYL CITRATE (PF) 100 MCG/2ML IJ SOLN
INTRAMUSCULAR | Status: AC
  Filled 2024-06-24: qty 2

## 2024-06-24 MED ORDER — IOHEXOL 180 MG/ML  SOLN
INTRAMUSCULAR | Status: AC
Start: 2024-06-24 — End: 2024-06-24
  Filled 2024-06-24: qty 10

## 2024-06-24 MED ORDER — LIDOCAINE HCL 2 % IJ SOLN
INTRAMUSCULAR | Status: AC
  Filled 2024-06-24: qty 20

## 2024-06-24 MED ORDER — ROPIVACAINE HCL 2 MG/ML IJ SOLN
1.0000 mL | Freq: Once | INTRAMUSCULAR | Status: AC
  Administered 2024-06-24: 1 mL via EPIDURAL

## 2024-06-24 MED ORDER — ROPIVACAINE HCL 2 MG/ML IJ SOLN
INTRAMUSCULAR | Status: AC
  Filled 2024-06-24: qty 20

## 2024-06-24 MED ORDER — DEXAMETHASONE SODIUM PHOSPHATE 10 MG/ML IJ SOLN
INTRAMUSCULAR | Status: AC
Start: 2024-06-24 — End: 2024-06-24
  Filled 2024-06-24: qty 1

## 2024-06-24 MED ORDER — MIDAZOLAM HCL 5 MG/5ML IJ SOLN
0.5000 mg | Freq: Once | INTRAMUSCULAR | Status: AC
  Administered 2024-06-24: 2 mg via INTRAVENOUS
  Administered 2024-06-24: 1 mg via INTRAVENOUS

## 2024-06-24 MED ORDER — SODIUM CHLORIDE 0.9% FLUSH
1.0000 mL | Freq: Once | INTRAVENOUS | Status: AC
  Administered 2024-06-24: 1 mL

## 2024-06-24 MED ORDER — PENTAFLUOROPROP-TETRAFLUOROETH EX AERO
INHALATION_SPRAY | Freq: Once | CUTANEOUS | Status: AC
  Administered 2024-06-24: 30 via TOPICAL

## 2024-06-24 MED ORDER — LIDOCAINE HCL 2 % IJ SOLN
20.0000 mL | Freq: Once | INTRAMUSCULAR | Status: AC
  Administered 2024-06-24: 400 mg

## 2024-06-24 MED ORDER — MIDAZOLAM HCL 5 MG/5ML IJ SOLN
INTRAMUSCULAR | Status: AC
  Filled 2024-06-24: qty 5

## 2024-06-24 NOTE — Progress Notes (Signed)
 PROVIDER NOTE: Interpretation of information contained herein should be left to medically-trained personnel. Specific patient instructions are provided elsewhere under Patient Instructions section of medical record. This document was created in part using STT-dictation technology, any transcriptional errors that may result from this process are unintentional.  Patient: Elaine Marquez Type: Established DOB: 04/05/1971 MRN: [id number] PCP: Vicci Barnie NOVAK, MD  Service: Procedure DOS: 06/24/2024 Setting: Ambulatory Location: Ambulatory outpatient facility Delivery: Face-to-face Provider: Eric DELENA Como, MD Specialty: Interventional Pain Management Specialty designation: 09 Location: Outpatient facility Ref. Prov.: Como Eric, MD       Interventional Therapy   Type: Cervical Epidural Steroid injection (CESI) (Interlaminar) #1  Laterality: Left (-LT)  Level: C7-T1 DOS: 06/24/2024  Provider: Eric DELENA Como, MD Imaging: Fluoroscopy-guided Spinal ([id number]) Anesthesia: Local anesthesia (1-2% Lidocaine ) Anxiolysis: IV Versed  3.0 mg Sedation: Moderate Sedation None required. No Fentanyl  administered.          Medical Necessity Purpose: Diagnostic/Therapeutic Rationale (medical necessity): procedure needed and proper for the diagnosis and/or treatment of Ms. Carl's medical symptoms and needs. Indications: Cervicalgia, cervical radicular pain, degenerative disc disease, severe enough to impact quality of life or function. 1. Chronic neck pain (1ry area of Pain)   2. Spinal stenosis in cervical region (C3-4, C4-5, C5-6)   3. Cervical foraminal stenosis (Bilateral: C3-4, C4-5, C5-6) (Left: C3-4Severe)   4. Cervical facet joint pain   5. Cervicalgia   6. Arthropathy of cervical facet joint   7. Abnormal MRI, cervical spine (10/15/2023)    NAS-11 Pain score:   Pre-procedure: 8 /10   Post-procedure: 2 /10     Position  Prep  Materials:  Location setting:  Procedure suite Position: Prone, on modified reverse trendelenburg to facilitate breathing, with head in head-cradle. Pillows positioned under chest (below chin-level) with cervical spine flexed. Safety Precautions: Patient was assessed for positional comfort and pressure points before starting the procedure. Prepping solution: DuraPrep (Iodine Povacrylex [0.7% available iodine] and Isopropyl Alcohol, 74% w/w) Prep Area: Entire  cervicothoracic region Approach: percutaneous, paramedial Intended target: Posterior cervical epidural space Materials Procedure:  Tray: Epidural Needle(s): Epidural (Tuohy) Qty: 1 Length: (90mm) 3.5-inch Gauge: 17G  H&P (Pre-op Assessment):  Ms. Pontius is a 53 y.o. (year old), female patient, seen today for interventional treatment. She  has a past surgical history that includes Tubal ligation. Ms. Elsbernd has a current medication list which includes the following prescription(s): accu-chek softclix lancets, amlodipine , accu-chek guide, freestyle libre 3 reader, freestyle libre 3 sensor, diclofenac  sodium, dulera , gabapentin , accu-chek guide, basaglar  kwikpen, litetouch pen needles, accu-chek fastclix lancet, losartan , metformin , methocarbamol , semaglutide  (2 mg/dose), and tramadol , and the following Facility-Administered Medications: fentanyl . Her primarily concern today is the Neck Pain  Initial Vital Signs:  Pulse/HCG Rate: (!) 104ECG Heart Rate: (!) 110 Temp: (!) 97.4 F (36.3 C) Resp: 16 BP: (!) 123/95 SpO2: 100 %  BMI: Estimated body mass index is 44.63 kg/m as calculated from the following:   Height as of 06/09/24: 5' 4 (1.626 m).   Weight as of this encounter: 260 lb (117.9 kg).  Risk Assessment: Allergies: Reviewed. She is allergic to ibuprofen .  Allergy Precautions: None required Coagulopathies: Reviewed. None identified.  Blood-thinner therapy: None at this time Active Infection(s): Reviewed. None identified. Ms. Antony is afebrile  Site  Confirmation: Ms. Shreffler was asked to confirm the procedure and laterality before marking the site Procedure checklist: Completed Consent: Before the procedure and under the influence of no sedative(s), amnesic(s), or anxiolytics, the patient was informed of the  treatment options, risks and possible complications. To fulfill our ethical and legal obligations, as recommended by the American Medical Association's Code of Ethics, I have informed the patient of my clinical impression; the nature and purpose of the treatment or procedure; the risks, benefits, and possible complications of the intervention; the alternatives, including doing nothing; the risk(s) and benefit(s) of the alternative treatment(s) or procedure(s); and the risk(s) and benefit(s) of doing nothing. The patient was provided information about the general risks and possible complications associated with the procedure. These may include, but are not limited to: failure to achieve desired goals, infection, bleeding, organ or nerve damage, allergic reactions, paralysis, and death. In addition, the patient was informed of those risks and complications associated to Spine-related procedures, such as failure to decrease pain; infection (i.e.: Meningitis, epidural or intraspinal abscess); bleeding (i.e.: epidural hematoma, subarachnoid hemorrhage, or any other type of intraspinal or peri-dural bleeding); organ or nerve damage (i.e.: Any type of peripheral nerve, nerve root, or spinal cord injury) with subsequent damage to sensory, motor, and/or autonomic systems, resulting in permanent pain, numbness, and/or weakness of one or several areas of the body; allergic reactions; (i.e.: anaphylactic reaction); and/or death. Furthermore, the patient was informed of those risks and complications associated with the medications. These include, but are not limited to: allergic reactions (i.e.: anaphylactic or anaphylactoid reaction(s)); adrenal axis suppression;  blood sugar elevation that in diabetics may result in ketoacidosis or comma; water retention that in patients with history of congestive heart failure may result in shortness of breath, pulmonary edema, and decompensation with resultant heart failure; weight gain; swelling or edema; medication-induced neural toxicity; particulate matter embolism and blood vessel occlusion with resultant organ, and/or nervous system infarction; and/or aseptic necrosis of one or more joints. Finally, the patient was informed that Medicine is not an exact science; therefore, there is also the possibility of unforeseen or unpredictable risks and/or possible complications that may result in a catastrophic outcome. The patient indicated having understood very clearly. We have given the patient no guarantees and we have made no promises. Enough time was given to the patient to ask questions, all of which were answered to the patient's satisfaction. Ms. Bergh has indicated that she wanted to continue with the procedure. Attestation: I, the ordering provider, attest that I have discussed with the patient the benefits, risks, side-effects, alternatives, likelihood of achieving goals, and potential problems during recovery for the procedure that I have provided informed consent. Date  Time: 06/24/2024 11:44 AM  Pre-Procedure Preparation:  Monitoring: As per clinic protocol. Respiration, ETCO2, SpO2, BP, heart rate and rhythm monitor placed and checked for adequate function Safety Precautions: Patient was assessed for positional comfort and pressure points before starting the procedure. Time-out: I initiated and conducted the Time-out before starting the procedure, as per protocol. The patient was asked to participate by confirming the accuracy of the Time Out information. Verification of the correct person, site, and procedure were performed and confirmed by me, the nursing staff, and the patient. Time-out conducted as per Joint  Commission's Universal Protocol (UP.01.01.01). Time: 1208 Start Time: 1208 hrs.  Description  Narrative of Procedure:          Rationale (medical necessity): procedure needed and proper for the diagnosis and/or treatment of the patient's medical symptoms and needs. Start Time: 1208 hrs. Safety Precautions: Aspiration looking for blood return was conducted prior to all injections. At no point did we inject any substances, as a needle was being advanced. No attempts were  made at seeking any paresthesias. Safe injection practices and needle disposal techniques used. Medications properly checked for expiration dates. SDV (single dose vial) medications used. Description of procedure: Protocol guidelines were followed. The patient was assisted into a comfortable position. The target area was identified and the area prepped in the usual manner. Skin & deeper tissues infiltrated with local anesthetic. Appropriate amount of time allowed to pass for local anesthetics to take effect. Using fluoroscopic guidance, the epidural needle was introduced through the skin, ipsilateral to the reported pain, and advanced to the target area. Posterior laminar os was contacted and the needle walked caudad, until the lamina was cleared. The ligamentum flavum was engaged and the epidural space identified using "loss-of-resistance technique" with 2-3 ml of PF-NaCl (0.9% NSS), in a 5cc dedicated LOR syringe. (See Imaging guidance below for use of contrast details.) Once proper needle placement was secured, and negative aspiration confirmed, the solution was injected in intermittent fashion, asking for systemic symptoms every 0.5cc. The needles were then removed and the area cleansed, making sure to leave some of the prepping solution back to take advantage of its long term bactericidal properties.  Vitals:   06/24/24 1225 06/24/24 1234 06/24/24 1246 06/24/24 1253  BP: (!) 147/116 122/80 131/87 136/80  Pulse:      Resp: 20 18  19 18   Temp:  98.1 F (36.7 C)  98.1 F (36.7 C)  TempSrc:      SpO2: 100% 98% 98% 98%  Weight:         End Time: 1225 hrs.  Imaging Guidance (Spinal):          Type of Imaging Technique: Fluoroscopy Guidance (Spinal) Indication(s): Fluoroscopy guidance for needle placement to enhance accuracy in procedures requiring precise needle localization for targeted delivery of medication in or near specific anatomical locations not easily accessible without such real-time imaging assistance. Exposure Time: Please see nurses notes. Contrast: Before injecting any contrast, we confirmed that the patient did not have an allergy to iodine, shellfish, or radiological contrast. Once satisfactory needle placement was completed at the desired level, radiological contrast was injected. Contrast injected under live fluoroscopy. No contrast complications. See chart for type and volume of contrast used. Fluoroscopic Guidance: I was personally present during the use of fluoroscopy. Tunnel Vision Technique used to obtain the best possible view of the target area. Parallax error corrected before commencing the procedure. Direction-depth-direction technique used to introduce the needle under continuous pulsed fluoroscopy. Once target was reached, antero-posterior, oblique, and lateral fluoroscopic projection used confirm needle placement in all planes. Images permanently stored in EMR. Interpretation: I personally interpreted the imaging intraoperatively. Adequate needle placement confirmed in multiple planes. Appropriate spread of contrast into desired area was observed. No evidence of afferent or efferent intravascular uptake. No intrathecal or subarachnoid spread observed. Permanent images saved into the patient's record.  Post-operative Assessment:  Post-procedure Vital Signs:  Pulse/HCG Rate: (!) 112100 Temp: 98.1 F (36.7 C) Resp: 18 BP: 136/80 SpO2: 98 %  EBL: None  Complications: No immediate  post-treatment complications observed by team, or reported by patient.  Note: The patient tolerated the entire procedure well. A repeat set of vitals were taken after the procedure and the patient was kept under observation following institutional policy, for this type of procedure. Post-procedural neurological assessment was performed, showing return to baseline, prior to discharge. The patient was provided with post-procedure discharge instructions, including a section on how to identify potential problems. Should any problems arise concerning this procedure, the  patient was given instructions to immediately contact us , at any time, without hesitation. In any case, we plan to contact the patient by telephone for a follow-up status report regarding this interventional procedure.  Comments:  No additional relevant information.  Plan of Care (POC)  Orders:  Orders Placed This Encounter  Procedures   Cervical Epidural Injection    Indication(s): Radiculitis and cervicalgia associated with cervical degenerative disc disease. Position: Prone Imaging guidance: Fluoroscopy required. Contrast required unless contraindicated by allergy or severe CKD. Equipment & Materials: Epidural tray & needle.    Scheduling Instructions:     Procedure: Cervical Epidural Steroid Injection/Block     Planned Level(s): C7-T1     Laterality: Right-sided     Anxiolysis: Patient's choice.     Date: 06/24/2024    Where will this procedure be performed?:   [facility]             by Dr. T[facility] 1-60 MIN NO REPORT    Intraoperative interpretation by procedural physician at [facility].    Standing Status:   Standing    Number of Occurrences:   1    Reason for exam::   Assistance in needle guidance and placement for procedures requiring needle placement in or near specific anatomical locations not easily accessible without such assistance.   Informed Consent Details:  Physician/Practitioner Attestation; Transcribe to consent form and obtain patient signature    Nursing instructions: Transcribe to consent form and obtain patient signature. Always confirm laterality of pain with Ms. Edge, before procedure.    Physician/Practitioner attestation of informed consent for procedure/surgical case:   I, the physician/practitioner, attest that I have discussed with the patient the benefits, risks, side effects, alternatives, likelihood of achieving goals and potential problems during recovery for the procedure that I have provided informed consent.    Procedure:   Cervical Epidural Steroid Injection (CESI) under fluoroscopic guidance    Physician/Practitioner performing the procedure:   Sherese Heyward A. Tanya MD    Indication/Reason:   Indications: Cervicalgia (neck pain), cervical radicular pain, radiculitis (arm/shoulder pain, numbness, and/or weakness), degenerative disc disease, severe enough to greatly impact quality of life or function.   Provide equipment / supplies at bedside    Procedural tray: Epidural Tray (Disposable  single use) Skin infiltration needle: Regular 1.5-in, 25-G, (x1) Block needle size: Regular standard Catheter: No catheter required    Standing Status:   Standing    Number of Occurrences:   1    Specify:   Epidural Tray   Saline lock IV    Have LR [phone] mL available and administer at 125 mL/hr if patient becomes hypotensive.    Standing Status:   Standing    Number of Occurrences:   1     Opioid Analgesic: Tramadol  Hcl 50 Mg Tablet  MME/day: 40 mg/day    Medications ordered for procedure: Meds ordered this encounter  Medications   iohexol  (OMNIPAQUE ) 180 MG/ML injection 10 mL    Must be Myelogram-compatible. If not available, you may substitute with a water-soluble, non-ionic, hypoallergenic, myelogram-compatible radiological contrast medium.   lidocaine  (XYLOCAINE ) 2 % (with pres) injection 400 mg    pentafluoroprop-tetrafluoroeth (GEBAUERS) aerosol   midazolam  (VERSED ) 5 MG/5ML injection 0.5-2 mg    Make sure Flumazenil is available in the pyxis when using this medication. If oversedation occurs, administer 0.2 mg IV over 15 sec. If after 45 sec no response, administer 0.2 mg again over 1 min;  may repeat at 1 min intervals; not to exceed 4 doses (1 mg)   fentaNYL  (SUBLIMAZE ) injection 25-50 mcg    Make sure Narcan is available in the pyxis when using this medication. In the event of respiratory depression (RR< 8/min): Titrate NARCAN (naloxone) in increments of 0.1 to 0.2 mg IV at 2-3 minute intervals, until desired degree of reversal.   sodium chloride  flush (NS) 0.9 % injection 1 mL   ropivacaine  (PF) 2 mg/mL (0.2%) (NAROPIN ) injection 1 mL   dexamethasone  (DECADRON ) injection 10 mg   Medications administered: We administered iohexol , lidocaine , pentafluoroprop-tetrafluoroeth, midazolam , sodium chloride  flush, ropivacaine  (PF) 2 mg/mL (0.2%), and dexamethasone .  See the medical record for exact dosing, route, and time of administration.    Interventional Therapies  Risk Factors  Considerations  Medical Comorbidities:     Planned  Pending:   Diagnostic/therapeutic left cervical ESI x1 (06/24/2024)    Under consideration:   Diagnostic/therapeutic left cervical ESI #1  Diagnostic/therapeutic bilateral cervical facet MBB #1    Completed: (Analgesic benefit)1  None at this time   Therapeutic  Palliative (PRN) options:   None established   Completed by other providers:   Procedure (s) by [facility] Tedi MD)  Date  Non Fac Inj facet jnt/nrv, cerv/thor sngl Mar-03-2024  Non Fac Inj facet jnt/nrv, cerv/thor Second Level Mar-03-2024  Non Fac Inj facet jnt/nrv, cerv/thor sngl Feb-24-2025  Non Fac Inj facet jnt/nrv, cerv/thor Second Level Feb-24-2025  Non Fac Destruction By Neuro Agt. Cerv Or Abigail Achille Menghini [phone]  Non Fac Destruction Neuro Agt, Bradly Or Abigail Add  Level Feb-13-2025  Non Fac Inj facet jnt/nrv, cerv/thor sngl Jan-23-2025  Non Fac Inj facet jnt/nrv, cerv/thor Second Level Jan-23-2025    1(Analgesic benefit): Expressed in percentage (%). (Local anesthetic[LA] +/- sedation  L.A.Local Anesthetic  Steroid benefit  Ongoing benefit)      Follow-up plan:   Return in about 2 weeks (around 07/08/2024) for (Face2F), (PPE).     Recent Visits Date Type Provider Dept  06/09/24 Office Visit Tanya Glisson, MD [facility]  05/26/24 Office Visit Tanya Glisson, MD [facility]  Showing recent visits within past 90 days and meeting all other requirements Today's Visits Date Type Provider Dept  06/24/24 Procedure visit Tanya Glisson, MD [facility]  Showing today's visits and meeting all other requirements Future Appointments Date Type Provider Dept  07/09/24 Appointment Tanya Glisson, MD [facility]  Showing future appointments within next 90 days and meeting all other requirements   Disposition: Discharge home  Discharge (Date  Time): 06/24/2024; 1255 hrs.   Primary Care Physician: Vicci Barnie NOVAK, MD Location: [facility] Note by: Glisson DELENA Tanya, MD (TTS technology used. I apologize for any typographical errors that were not detected and corrected.) Date: 06/24/2024; Time: 1:24 PM  Disclaimer:  Medicine is not an Visual merchandiser. The only guarantee in medicine is that nothing is guaranteed. It is important to note that the decision to proceed with this intervention was based on the information collected from the patient. The Data and conclusions were drawn from the patient's questionnaire, the interview, and the physical examination. Because the information was provided in large part by the patient, it cannot be guaranteed that it has not been purposely or unconsciously manipulated. Every effort has been made to obtain as much relevant data as possible  for this evaluation. It is important to note that the conclusions that lead to this procedure are derived in large  part from the available data. Always take into account that the treatment will also be dependent on availability of resources and existing treatment guidelines, considered by other Pain Management Practitioners as being common knowledge and practice, at the time of the intervention. For Medico-Legal purposes, it is also important to point out that variation in procedural techniques and pharmacological choices are the acceptable norm. The indications, contraindications, technique, and results of the above procedure should only be interpreted and judged by a Board-Certified Interventional Pain Specialist with extensive familiarity and expertise in the same exact procedure and technique.

## 2024-06-24 NOTE — Patient Instructions (Signed)

## 2024-06-24 NOTE — Progress Notes (Signed)
 Safety precautions to be maintained throughout the outpatient stay will include: orient to surroundings, keep bed in low position, maintain call bell within reach at all times, provide assistance with transfer out of bed and ambulation.

## 2024-06-25 ENCOUNTER — Telehealth: Payer: Self-pay

## 2024-06-25 NOTE — Telephone Encounter (Signed)
 No issues post-procedure.

## 2024-06-26 DIAGNOSIS — F339 Major depressive disorder, recurrent, unspecified: Secondary | ICD-10-CM | POA: Diagnosis not present

## 2024-06-27 DIAGNOSIS — F339 Major depressive disorder, recurrent, unspecified: Secondary | ICD-10-CM | POA: Diagnosis not present

## 2024-07-04 DIAGNOSIS — F339 Major depressive disorder, recurrent, unspecified: Secondary | ICD-10-CM | POA: Diagnosis not present

## 2024-07-05 DIAGNOSIS — F339 Major depressive disorder, recurrent, unspecified: Secondary | ICD-10-CM | POA: Diagnosis not present

## 2024-07-07 ENCOUNTER — Other Ambulatory Visit: Payer: Self-pay | Admitting: Internal Medicine

## 2024-07-07 DIAGNOSIS — E1159 Type 2 diabetes mellitus with other circulatory complications: Secondary | ICD-10-CM

## 2024-07-09 ENCOUNTER — Ambulatory Visit (HOSPITAL_BASED_OUTPATIENT_CLINIC_OR_DEPARTMENT_OTHER): Admitting: Pain Medicine

## 2024-07-09 DIAGNOSIS — Z09 Encounter for follow-up examination after completed treatment for conditions other than malignant neoplasm: Secondary | ICD-10-CM

## 2024-07-09 DIAGNOSIS — Z91199 Patient's noncompliance with other medical treatment and regimen due to unspecified reason: Secondary | ICD-10-CM

## 2024-07-09 NOTE — Progress Notes (Signed)
 Department: [facility]) Date: 07/09/2024  Event: Canceled/Rescheduled by patient..  Encounter Type: (PPE-1) First/Initial post-procedure evaluation.. Diagnostic Advance notice: Less than 24 hr notice.  Reason: Sickness..          Significance: Unintended loss of valuable diagnostic information.

## 2024-07-10 ENCOUNTER — Telehealth: Payer: Self-pay | Admitting: Internal Medicine

## 2024-07-10 NOTE — Telephone Encounter (Signed)
 Called pt to confirm appt for 7/25

## 2024-07-11 ENCOUNTER — Ambulatory Visit: Admitting: Internal Medicine

## 2024-07-11 DIAGNOSIS — F339 Major depressive disorder, recurrent, unspecified: Secondary | ICD-10-CM | POA: Diagnosis not present

## 2024-07-12 DIAGNOSIS — F339 Major depressive disorder, recurrent, unspecified: Secondary | ICD-10-CM | POA: Diagnosis not present

## 2024-07-14 ENCOUNTER — Telehealth: Payer: Self-pay | Admitting: Internal Medicine

## 2024-07-14 NOTE — Telephone Encounter (Signed)
 Called pt to confirm appt for 7/29 LVM

## 2024-07-15 ENCOUNTER — Ambulatory Visit: Admitting: Internal Medicine

## 2024-07-16 ENCOUNTER — Ambulatory Visit: Attending: Pain Medicine | Admitting: Pain Medicine

## 2024-07-16 ENCOUNTER — Encounter: Payer: Self-pay | Admitting: Pain Medicine

## 2024-07-16 VITALS — BP 128/92 | HR 109 | Temp 96.6°F | Resp 18 | Ht 64.0 in | Wt 260.0 lb

## 2024-07-16 DIAGNOSIS — G8929 Other chronic pain: Secondary | ICD-10-CM | POA: Diagnosis not present

## 2024-07-16 DIAGNOSIS — M4802 Spinal stenosis, cervical region: Secondary | ICD-10-CM | POA: Insufficient documentation

## 2024-07-16 DIAGNOSIS — Z09 Encounter for follow-up examination after completed treatment for conditions other than malignant neoplasm: Secondary | ICD-10-CM | POA: Insufficient documentation

## 2024-07-16 DIAGNOSIS — M542 Cervicalgia: Secondary | ICD-10-CM | POA: Insufficient documentation

## 2024-07-16 DIAGNOSIS — F339 Major depressive disorder, recurrent, unspecified: Secondary | ICD-10-CM | POA: Diagnosis not present

## 2024-07-16 NOTE — Progress Notes (Signed)
 PROVIDER NOTE: Interpretation of information contained herein should be left to medically-trained personnel. Specific patient instructions are provided elsewhere under Patient Instructions section of medical record. This document was created in part using AI and STT-dictation technology, any transcriptional errors that may result from this process are unintentional.  Patient: Elaine Marquez  Service: E/M   PCP: Vicci Barnie NOVAK, MD  DOB: 1971/09/06  DOS: 07/16/2024  Provider: Eric DELENA Como, MD  MRN: [id number]  Delivery: Face-to-face  Specialty: Interventional Pain Management  Type: Established Patient  Setting: Ambulatory outpatient facility  Specialty designation: 09  Referring Prov.: Vicci Barnie NOVAK, MD  Location: Outpatient office facility       History of present illness (HPI) Ms. Elaine Marquez, a 53 y.o. year old female, is here today because of her Chronic neck pain [M54.2, G89.29]. Ms. Elaine Marquez primary complain today is Neck Pain  Pertinent problems: Ms. Elaine Marquez has Spondylosis without myelopathy or radiculopathy, cervical region; Arthropathy of cervical facet joint; Ossification of posterior longitudinal ligament (HCC); Spinal stenosis in cervical region (C3-4, C4-5, C5-6); Chronic pain syndrome; Abnormal MRI, cervical spine (10/15/2023); Cervicalgia; Chronic neck pain (1ry area of Pain); Chronic low back pain (2ry area of Pain) (Bilateral) w/o sciatica; Carpal tunnel syndrome (Left); Lumbar facet joint pain; Cervical facet joint pain; Cervical foraminal stenosis (Bilateral: C3-4, C4-5, C5-6) (Left: C3-4Severe); and Ossification of ligamentum flavum (Cervical) on their pertinent problem list.  Pain Assessment: Severity of Chronic pain is reported as a 7 /10. Location: Neck  / . Onset: More than a month ago. Quality: Dull, Aching, Sharp. Timing: Constant. Modifying factor(s): lying down, Tylenol . Vitals:  height is 5' 4 (1.626 m) and weight is 260 lb (117.9 kg). Her  temporal temperature is 96.6 F (35.9 C) (abnormal). Her blood pressure is 128/92 (abnormal) and her pulse is 109 (abnormal). Her respiration is 18 and oxygen saturation is 97%.  BMI: Estimated body mass index is 44.63 kg/m as calculated from the following:   Height as of this encounter: 5' 4 (1.626 m).   Weight as of this encounter: 260 lb (117.9 kg).  Last encounter: 07/09/2024. Last procedure: 06/24/2024.  Reason for encounter: post-procedure evaluation and assessment.   Discussed the use of AI scribe software for clinical note transcription with the patient, who gave verbal consent to proceed.  History of Present Illness   Elaine Marquez is a 53 year old female who presents for follow-up after a left-sided cervical epidural steroid injection.  She experienced complete relief of pain during the duration of the local anesthetic, followed by a 75% ongoing improvement in her symptoms. She felt significantly better for nine days post-procedure, noting that the pain is still present but not as debilitating as before.  Initially, the pain was worse on the left side, affecting her neck and shoulder, but has improved significantly on that side. She mentions that the right side now hurts, although she can now raise her arm, which she was unable to do before the injection.      Post-Procedure Evaluation   Type: Cervical Epidural Steroid injection (CESI) (Interlaminar) #1  Laterality: Left (-LT)  Level: C7-T1 DOS: 06/24/2024  Provider: Eric DELENA Como, MD Imaging: Fluoroscopy-guided Spinal ([id number]) Anesthesia: Local anesthesia (1-2% Lidocaine ) Anxiolysis: IV Versed  3.0 mg Sedation: Moderate Sedation None required. No Fentanyl  administered.          Medical Necessity Purpose: Diagnostic/Therapeutic Rationale (medical necessity): procedure needed and proper for the diagnosis and/or treatment of Ms. Elaine Marquez's medical symptoms and needs. Indications:  Cervicalgia, cervical radicular  pain, degenerative disc disease, severe enough to impact quality of life or function. 1. Chronic neck pain (1ry area of Pain)   2. Spinal stenosis in cervical region (C3-4, C4-5, C5-6)   3. Cervical foraminal stenosis (Bilateral: C3-4, C4-5, C5-6) (Left: C3-4Severe)   4. Cervical facet joint pain   5. Cervicalgia   6. Arthropathy of cervical facet joint   7. Abnormal MRI, cervical spine (10/15/2023)    NAS-11 Pain score:   Pre-procedure: 8 /10   Post-procedure: 2 /10     Effectiveness:  Initial hour after procedure: 100 %. Subsequent 4-6 hours post-procedure: 100 %. Analgesia past initial 6 hours: 75 %. Ongoing improvement:  Analgesic: The patient indicates having attained 100% relief of the pain for the duration of the local anesthetic followed by an ongoing 75% improvement of her symptoms. Function: Ms. Elaine Marquez reports improvement in function ROM: Ms. Elaine Marquez reports improvement in ROM  Pharmacotherapy Assessment   Opioid Analgesic: Tramadol  Hcl 50 Mg Tablet  MME/day: 40 mg/day   Monitoring: [place] PMP: PDMP reviewed during this encounter.       Pharmacotherapy: No side-effects or adverse reactions reported. Compliance: No problems identified. Effectiveness: Clinically acceptable.  No notes on file  UDS:  Summary  Date Value Ref Range Status  05/26/2024 FINAL  Final    Comment:    ==================================================================== Compliance Drug Analysis, Ur ==================================================================== Test                             Result       Flag       Units  Drug Present and Declared for Prescription Verification   Tramadol                        >4348        EXPECTED   ng/mg creat   O-Desmethyltramadol            >4348        EXPECTED   ng/mg creat   N-Desmethyltramadol            2836         EXPECTED   ng/mg creat    Source of tramadol  is a prescription medication. O-desmethyltramadol    and N-desmethyltramadol are  expected metabolites of tramadol .    Gabapentin                      PRESENT      EXPECTED   Methocarbamol                   PRESENT      EXPECTED  Drug Present not Declared for Prescription Verification   Noroxycodone                   162          UNEXPECTED ng/mg creat    Noroxycodone is an expected metabolite of oxycodone . Sources of    oxycodone  include scheduled prescription medications.    Pregabalin                     PRESENT      UNEXPECTED   Acetaminophen                   PRESENT      UNEXPECTED   Diphenhydramine  PRESENT      UNEXPECTED  Drug Absent but Declared for Prescription Verification   Diclofenac                      Not Detected UNEXPECTED    Topical diclofenac , as indicated in the declared medication list, is    not always detected even when used as directed.  ==================================================================== Test                      Result    Flag   Units      Ref Range   Creatinine              115              mg/dL      >=79 ==================================================================== Declared Medications:  The flagging and interpretation on this report are based on the  following declared medications.  Unexpected results may arise from  inaccuracies in the declared medications.   **Note: The testing scope of this panel includes these medications:   Gabapentin  (Neurontin )  Methocarbamol  (Robaxin )  Tramadol  (Ultram )   **Note: The testing scope of this panel does not include small to  moderate amounts of these reported medications:   Topical Diclofenac  (Voltaren )   **Note: The testing scope of this panel does not include the  following reported medications:   Amlodipine  (Norvasc )  Formoterol  (Dulera )  Insulin  (Basaglar )  Losartan  (Cozaar )  Metformin  (Glucophage )  Mometasone  (Dulera )  Semaglutide  ==================================================================== For clinical consultation, please call  [phone]. ====================================================================     No results found for: CBDTHCR No results found for: D8THCCBX No results found for: D9THCCBX  ROS  Constitutional: Denies any fever or chills Gastrointestinal: No reported hemesis, hematochezia, vomiting, or acute GI distress Musculoskeletal: Denies any acute onset joint swelling, redness, loss of ROM, or weakness Neurological: No reported episodes of acute onset apraxia, aphasia, dysarthria, agnosia, amnesia, paralysis, loss of coordination, or loss of consciousness  Medication Review  Accu-Chek FastClix Lancet, Accu-Chek Guide, Accu-Chek Softclix Lancets, Basaglar  KwikPen, FreeStyle Libre 3 Reader, FreeStyle Libre 3 Sensor, Insulin  Pen Needle, Semaglutide  (2 MG/DOSE), amLODipine , diclofenac  Sodium, gabapentin , glucose blood, losartan , metFORMIN , methocarbamol , mometasone -formoterol , and traMADol   History Review  Allergy: Ms. Elaine Marquez is allergic to ibuprofen . Drug: Ms. Elaine Marquez  reports no history of drug use. Alcohol:  reports current alcohol use. Tobacco:  reports that she has never smoked. She has never used smokeless tobacco. Social: Ms. Elaine Marquez  reports that she has never smoked. She has never used smokeless tobacco. She reports current alcohol use. She reports that she does not use drugs. Medical:  has a past medical history of Asthma, Diabetes mellitus without complication (HCC), Hypertension, and Peritonsillar abscess. Surgical: Ms. Elaine Marquez  has a past surgical history that includes Tubal ligation. Family: family history includes CVA in her brother; Diabetes in her father and mother; Hypertension in her father and mother.  Laboratory Chemistry Profile   Renal Lab Results  Component Value Date   BUN 11 03/11/2024   CREATININE 0.59 03/11/2024   LABCREA 71.5 03/11/2024   BCR 19 03/11/2024   GFRAA 118 12/24/2019   GFRNONAA 103 12/24/2019    Hepatic Lab Results  Component Value  Date   AST 14 03/11/2024   ALT 18 03/11/2024   ALBUMIN 4.6 03/11/2024   ALKPHOS 33 (L) 03/11/2024    Electrolytes Lab Results  Component Value Date   NA 138 03/11/2024   K 4.4 03/11/2024   CL 102 03/11/2024  CALCIUM 10.3 (H) 03/11/2024   MG 1.7 05/26/2024    Bone Lab Results  Component Value Date   25OHVITD1 22 (L) 05/26/2024   25OHVITD2 <1.0 05/26/2024   25OHVITD3 22 05/26/2024    Inflammation (CRP: Acute Phase) (ESR: Chronic Phase) Lab Results  Component Value Date   CRP 5 05/26/2024   ESRSEDRATE 46 (H) 05/26/2024         Note: Above Lab results reviewed.  Recent Imaging Review  DG PAIN CLINIC C-ARM 1-60 MIN NO REPORT Fluoro was used, but no Radiologist interpretation will be provided.  Please refer to NOTES tab for provider progress note. Note: Reviewed        Physical Exam  Vitals: BP (!) 128/92 (Cuff Size: Large)   Pulse (!) 109   Temp (!) 96.6 F (35.9 C) (Temporal)   Resp 18   Ht 5' 4 (1.626 m)   Wt 260 lb (117.9 kg)   SpO2 97%   BMI 44.63 kg/m  BMI: Estimated body mass index is 44.63 kg/m as calculated from the following:   Height as of this encounter: 5' 4 (1.626 m).   Weight as of this encounter: 260 lb (117.9 kg). Ideal: Ideal body weight: 54.7 kg (120 lb 9.5 oz) Adjusted ideal body weight: 80 kg (176 lb 5.7 oz) General appearance: Well nourished, well developed, and well hydrated. In no apparent acute distress Mental status: Alert, oriented x 3 (person, place, & time)       Respiratory: No evidence of acute respiratory distress Eyes: PERLA   Assessment   Diagnosis Status  1. Chronic neck pain (1ry area of Pain)   2. Cervicalgia   3. Cervical facet joint pain   4. Cervical foraminal stenosis (Bilateral: C3-4, C4-5, C5-6) (Left: C3-4Severe)   5. Spinal stenosis in cervical region (C3-4, C4-5, C5-6)   6. Postop check    Controlled Controlled Controlled   Updated Problems: No problems updated.  Plan of Care  Problem-specific:   Assessment and Plan    Cervical radiculopathy, left side   Significant improvement followed a left-sided cervical epidural steroid injection at the C7-T1 level. She experienced 100% relief during the local anesthetic's duration and sustained 75% symptom improvement thereafter. The previously debilitating left-sided neck and shoulder pain has decreased significantly, enabling her to raise her arm, which was previously impossible. Consider up to two additional cervical epidural steroid injections within six months, spaced at least two weeks apart, if symptoms persist or worsen.  Right-sided neck and shoulder pain (cervical radiculopathy, right side) Ongoing right-sided neck and shoulder pain and swelling on the right side suggest possible inflammation or irritation.       Ms. Elaine Marquez has a current medication list which includes the following long-term medication(s): amlodipine , dulera , gabapentin , basaglar  kwikpen, losartan , and metformin .  Pharmacotherapy (Medications Ordered): No orders of the defined types were placed in this encounter.  Orders:  Orders Placed This Encounter  Procedures   Cervical Epidural Injection    Sedation: Patient's choice. Purpose: Diagnostic/Therapeutic Indication(s): Radiculitis and cervicalgia associater with cervical degenerative disc disease.    Standing Status:   Future    Expiration Date:   10/16/2024    Scheduling Instructions:     Procedure: Cervical Epidural Steroid Injection/Block     Level(s): C7-T1     Laterality: Right-sided     Timeframe: As soon as schedule allows.    Where will this procedure be performed?:   [facility]  by Dr. Tanya   Nursing Instructions:    Please complete this patient's postprocedure evaluation.    Scheduling Instructions:     Please complete this patient's postprocedure evaluation.     Interventional Therapies  Risk Factors  Considerations  Medical Comorbidities:     Planned   Pending:   Diagnostic/therapeutic left cervical ESI x1 (06/24/2024)    Under consideration:   Diagnostic/therapeutic left cervical ESI #1  Diagnostic/therapeutic bilateral cervical facet MBB #1    Completed: (Analgesic benefit)1  None at this time   Therapeutic  Palliative (PRN) options:   None established   Completed by other providers:   Procedure (s) by [facility] Tedi MD)  Date  Non Fac Inj facet jnt/nrv, cerv/thor sngl Mar-03-2024  Non Fac Inj facet jnt/nrv, cerv/thor Second Level Mar-03-2024  Non Fac Inj facet jnt/nrv, cerv/thor sngl Feb-24-2025  Non Fac Inj facet jnt/nrv, cerv/thor Second Level Feb-24-2025  Non Fac Destruction By Neuro Agt. Cerv Or Abigail Achille Menghini [phone]  Non Fac Destruction Neuro Agt, Bradly Or Abigail Add Level Feb-13-2025  Non Fac Inj facet jnt/nrv, cerv/thor sngl Jan-23-2025  Non Fac Inj facet jnt/nrv, cerv/thor Second Level Jan-23-2025    1(Analgesic benefit): Expressed in percentage (%). (Local anesthetic[LA] +/- sedation  L.A.Local Anesthetic  Steroid benefit  Ongoing benefit)     Return for (ECT): (R) CESI #2.    Recent Visits Date Type Provider Dept  06/24/24 Procedure visit Tanya Glisson, MD [facility]  06/09/24 Office Visit Tanya Glisson, MD [facility]  05/26/24 Office Visit Tanya Glisson, MD [facility]  Showing recent visits within past 90 days and meeting all other requirements Today's Visits Date Type Provider Dept  07/16/24 Office Visit Tanya Glisson, MD [facility]  Showing today's visits and meeting all other requirements Future Appointments No visits were found meeting these conditions. Showing future appointments within next 90 days and meeting all other requirements  I discussed the assessment and treatment plan with the patient. The patient was provided an opportunity to ask questions and all were answered. The patient agreed with the plan and  demonstrated an understanding of the instructions.  Patient advised to call back or seek an in-person evaluation if the symptoms or condition worsens.  Duration of encounter: 30 minutes.  Total time on encounter, as per AMA guidelines included both the face-to-face and non-face-to-face time personally spent by the physician and/or other qualified health care professional(s) on the day of the encounter (includes time in activities that require the physician or other qualified health care professional and does not include time in activities normally performed by clinical staff). Physician's time may include the following activities when performed: Preparing to see the patient (e.g., pre-charting review of records, searching for previously ordered imaging, lab work, and nerve conduction tests) Review of prior analgesic pharmacotherapies. Reviewing PMP Interpreting ordered tests (e.g., lab work, imaging, nerve conduction tests) Performing post-procedure evaluations, including interpretation of diagnostic procedures Obtaining and/or reviewing separately obtained history Performing a medically appropriate examination and/or evaluation Counseling and educating the patient/family/caregiver Ordering medications, tests, or procedures Referring and communicating with other health care professionals (when not separately reported) Documenting clinical information in the electronic or other health record Independently interpreting results (not separately reported) and communicating results to the patient/ family/caregiver Care coordination (not separately reported)  Note by: Glisson DELENA Tanya, MD (TTS and AI technology used. I apologize for any typographical errors that were not detected and corrected.) Date: 07/16/2024; Time: 11:38 AM

## 2024-07-16 NOTE — Patient Instructions (Signed)

## 2024-07-17 DIAGNOSIS — F339 Major depressive disorder, recurrent, unspecified: Secondary | ICD-10-CM | POA: Diagnosis not present

## 2024-07-18 ENCOUNTER — Ambulatory Visit: Payer: Self-pay

## 2024-07-18 NOTE — Telephone Encounter (Signed)
 noted

## 2024-07-18 NOTE — Telephone Encounter (Signed)
 Pt states that she had a injection a couple weeks ago and her BG has been running higher than her normal. States that it has gone up to 396 at times, current reading this morning was 197.  Requesting appt, booked for earliest available and added to wait list.

## 2024-07-18 NOTE — Telephone Encounter (Signed)
 FYI Only or Action Required?: FYI only for provider.  Patient was last seen in primary care on 03/11/2024 by Vicci Barnie NOVAK, MD.  Called Nurse Triage reporting Blood Sugar Problem.  Symptoms began several weeks ago.  Interventions attempted: Prescription medications: insulin  and pills.  Symptoms are: unchanged.  Triage Disposition: Home Care  Patient/caregiver understands and will follow disposition?: Yes        Copied from CRM [id number]. Topic: Clinical - Red Word Triage >> Jul 18, 2024 10:19 AM Montie POUR wrote: Red Word that prompted transfer to Nurse Triage:  Blood sugar is reading between 250 - 350 for about 2 weeks Reason for Disposition  Blood glucose 70-240 mg/dL (3.9 -86.6 mmol/L)  Answer Assessment - Initial Assessment Questions 1. BLOOD GLUCOSE: What is your blood glucose level?      197 2. ONSET: When did you check the blood glucose?     This morning 3. USUAL RANGE: What is your glucose level usually? (e.g., usual fasting morning value, usual evening value)     170-180 4. KETONES: Do you check for ketones (urine or blood test strips)? If Yes, ask: What does the test show now?      no 5. TYPE 1 or 2:  Do you know what type of diabetes you have?  (e.g., Type 1, Type 2, Gestational; doesn't know)      Type 1 6. INSULIN : Do you take insulin ? What type of insulin (s) do you use? What is the mode of delivery? (syringe, pen; injection or pump)?      Insulin  pen-long acting 7. DIABETES PILLS: Do you take any pills for your diabetes? If Yes, ask: Have you missed taking any pills recently?     no 8. OTHER SYMPTOMS: Do you have any symptoms? (e.g., fever, frequent urination, difficulty breathing, dizziness, weakness, vomiting)     No , but if her BG get over 300 she feels numbness and tingling in her feet.  Protocols used: Diabetes - High Blood Sugar-A-AH

## 2024-07-21 DIAGNOSIS — F339 Major depressive disorder, recurrent, unspecified: Secondary | ICD-10-CM | POA: Diagnosis not present

## 2024-07-22 DIAGNOSIS — F339 Major depressive disorder, recurrent, unspecified: Secondary | ICD-10-CM | POA: Diagnosis not present

## 2024-07-23 DIAGNOSIS — F339 Major depressive disorder, recurrent, unspecified: Secondary | ICD-10-CM | POA: Diagnosis not present

## 2024-07-28 DIAGNOSIS — F339 Major depressive disorder, recurrent, unspecified: Secondary | ICD-10-CM | POA: Diagnosis not present

## 2024-07-29 ENCOUNTER — Other Ambulatory Visit: Payer: Self-pay | Admitting: Internal Medicine

## 2024-07-29 DIAGNOSIS — F339 Major depressive disorder, recurrent, unspecified: Secondary | ICD-10-CM | POA: Diagnosis not present

## 2024-07-29 DIAGNOSIS — G8929 Other chronic pain: Secondary | ICD-10-CM

## 2024-07-30 DIAGNOSIS — F339 Major depressive disorder, recurrent, unspecified: Secondary | ICD-10-CM | POA: Diagnosis not present

## 2024-08-04 ENCOUNTER — Telehealth: Payer: Self-pay | Admitting: Pain Medicine

## 2024-08-04 DIAGNOSIS — F339 Major depressive disorder, recurrent, unspecified: Secondary | ICD-10-CM | POA: Diagnosis not present

## 2024-08-04 NOTE — Telephone Encounter (Signed)
 PT called stated that she is on a lot of pain. Please give patient a call. TY

## 2024-08-04 NOTE — Telephone Encounter (Signed)
 Called patient. She states that she has been in a lot of pain and requesting medications. Patient does not have an appointment. Instructed patient that MD does not write medications and that we need to make her an appt. What is pain management then if he doesn't write pain med Told patient that he does procedures to help get her out of the pain. She stated that she will just suffer and told me Thank you for calling and Goodbye

## 2024-08-04 NOTE — Telephone Encounter (Signed)
 Patient had procedure on 06/24/24 and was no show for follow up on 07/10/23

## 2024-08-05 ENCOUNTER — Other Ambulatory Visit: Payer: Self-pay | Admitting: Internal Medicine

## 2024-08-05 DIAGNOSIS — E1159 Type 2 diabetes mellitus with other circulatory complications: Secondary | ICD-10-CM

## 2024-08-05 DIAGNOSIS — F339 Major depressive disorder, recurrent, unspecified: Secondary | ICD-10-CM | POA: Diagnosis not present

## 2024-08-12 ENCOUNTER — Telehealth: Payer: Self-pay | Admitting: Internal Medicine

## 2024-08-12 NOTE — Telephone Encounter (Signed)
 Called patient to confirm upcoming appointment 08/14/2024. Patient appointment has been successfully confirmed

## 2024-08-13 DIAGNOSIS — F339 Major depressive disorder, recurrent, unspecified: Secondary | ICD-10-CM | POA: Diagnosis not present

## 2024-08-14 ENCOUNTER — Ambulatory Visit: Attending: Internal Medicine | Admitting: Internal Medicine

## 2024-08-14 ENCOUNTER — Other Ambulatory Visit: Payer: Self-pay | Admitting: Internal Medicine

## 2024-08-14 ENCOUNTER — Encounter: Payer: Self-pay | Admitting: Internal Medicine

## 2024-08-14 DIAGNOSIS — E1169 Type 2 diabetes mellitus with other specified complication: Secondary | ICD-10-CM | POA: Diagnosis not present

## 2024-08-14 DIAGNOSIS — Z6841 Body Mass Index (BMI) 40.0 and over, adult: Secondary | ICD-10-CM

## 2024-08-14 DIAGNOSIS — Z7985 Long-term (current) use of injectable non-insulin antidiabetic drugs: Secondary | ICD-10-CM

## 2024-08-14 DIAGNOSIS — E119 Type 2 diabetes mellitus without complications: Secondary | ICD-10-CM

## 2024-08-14 DIAGNOSIS — Z794 Long term (current) use of insulin: Secondary | ICD-10-CM

## 2024-08-14 DIAGNOSIS — R590 Localized enlarged lymph nodes: Secondary | ICD-10-CM

## 2024-08-14 DIAGNOSIS — I152 Hypertension secondary to endocrine disorders: Secondary | ICD-10-CM | POA: Diagnosis not present

## 2024-08-14 DIAGNOSIS — E1159 Type 2 diabetes mellitus with other circulatory complications: Secondary | ICD-10-CM

## 2024-08-14 DIAGNOSIS — Z23 Encounter for immunization: Secondary | ICD-10-CM

## 2024-08-14 DIAGNOSIS — F339 Major depressive disorder, recurrent, unspecified: Secondary | ICD-10-CM | POA: Diagnosis not present

## 2024-08-14 DIAGNOSIS — Z7984 Long term (current) use of oral hypoglycemic drugs: Secondary | ICD-10-CM

## 2024-08-14 LAB — POCT GLYCOSYLATED HEMOGLOBIN (HGB A1C): HbA1c, POC (controlled diabetic range): 9.1 % — AB (ref 0.0–7.0)

## 2024-08-14 LAB — GLUCOSE, POCT (MANUAL RESULT ENTRY): POC Glucose: 165 mg/dL — AB (ref 70–99)

## 2024-08-14 MED ORDER — METFORMIN HCL ER 500 MG PO TB24: 1000.0000 mg | ORAL_TABLET | Freq: Two times a day (BID) | ORAL | 1 refills | Status: DC

## 2024-08-14 MED ORDER — BASAGLAR KWIKPEN 100 UNIT/ML ~~LOC~~ SOPN
27.0000 [IU] | PEN_INJECTOR | Freq: Every day | SUBCUTANEOUS | 6 refills | Status: DC
Start: 2024-08-14 — End: 2024-10-21

## 2024-08-14 NOTE — Patient Instructions (Signed)
 VISIT SUMMARY:  Today, you came in for a follow-up visit to manage your blood pressure and diabetes. We discussed your current medications, blood sugar levels, and recent weight changes. You also mentioned a small lymph node in front of your ear and your recent upper respiratory infection.  YOUR PLAN:  -TYPE 2 DIABETES MELLITUS WITH DIABETIC POLYNEUROPATHY: Your blood sugar levels are not well-controlled with your current medications. We will increase your glargine insulin  to 27 units daily and adjust your metformin  to 1 gram twice daily. It's important to incorporate exercise at least twice a week, aiming for three times a week if possible.  -HYPERTENSION: Your blood pressure is well-controlled with your current medications, losartan  25 mg and amlodipine  10 mg. No changes are needed at this time.  -OBESITY: Despite a decrease in appetite, there has been no significant weight loss, and your weight has increased by 6 pounds since March. We encourage you to exercise at least twice a week, aiming for three times a week if possible.  -ENLARGED RIGHT PREAURICULAR LYMPH NODE: You have a small lymph node in front of your ear that has been present for a couple of days. Since there are no signs of an ear infection, we will monitor it for resolution over the next week.  -GENERAL HEALTH MAINTENANCE: You are due for a shingles vaccine and a diabetic eye exam. You have a Cologuard kit at home for colon cancer screening and a Pap smear scheduled in six weeks. We will administer the first dose of the shingles vaccine today and the second dose in 2-6 months. Please complete the Cologuard kit and schedule your diabetic eye exam.  INSTRUCTIONS:  Please follow up in six weeks for your Pap smear. Monitor the lymph node in front of your ear for any changes and let us  know if it does not improve. Continue with your current medications and the adjusted doses as discussed. Make sure to exercise regularly and complete your  Cologuard kit for colon cancer screening. Schedule your diabetic eye exam as soon as possible.

## 2024-08-14 NOTE — Progress Notes (Signed)
 Patient ID: Elaine Marquez, female    DOB: 1971/01/16  MRN: [id number]  CC: Diabetes (DM. Elaine Marquez that BS readings are more in range - 46% in range per herlene sensor/Requesting ibuprofen  to be removed from allergy list /Yes to shingles & flu vax. )   Subjective: [name] is a 53 y.o. female who presents for chronic ds management. 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  History of Present Illness Elaine Marquez is a 53 year old female with hypertension and diabetes who presents for follow-up of her blood pressure and diabetes management.  DM: Results for orders placed or performed in visit on 08/14/24  POCT glucose (manual entry)   Collection Time: 08/14/24 11:24 AM  Result Value Ref Range   POC Glucose 165 (A) 70 - 99 mg/dl  POCT glycosylated hemoglobin (Hb A1C)   Collection Time: 08/14/24 11:37 AM  Result Value Ref Range   Hemoglobin A1C     HbA1c POC (<> result, manual entry)     HbA1c, POC (prediabetic range)     HbA1c, POC (controlled diabetic range) 9.1 (A) 0.0 - 7.0 %  She has been on Ozempic  the 2 mg dose for four weeks. She continues to take glargine insulin  at 23 units and metformin , 1000 mg in the morning and 500 mg in the evening. She experiences a decrease in appetite but no weight loss since changing from Trulicity  to Ozempic  in March, with her weight increasing from 262 lbs in March to 268 lbs currently. She does not consume sugary drinks or sweets and attributes her weight maintenance to a lack of exercise.  Her continuous glucose monitor shows 46% time in range, with blood sugars between 180-250 mg/dL 49% of the time and no low episodes. Blood sugar spikes after eating but decreases quickly.  Morning blood sugars range from 150-230 mg/dL, lunchtime from 859-749 mg/dL, and dinner from 130-230 mg/dL. No hypoglycemic episodes experienced.  HTN: She is currently on losartan  25 mg and amlodipine  10 mg for blood pressure management. She is not on any cholesterol medication, with her LDL cholesterol at 40 mg/dL, which is below the target of less than 70 mg/dL.  She reports a small lymph node in front of her ear for a couple of days. She had a recent illness with itchy and leaky ears, which improved with Nyquil. No current ear infection symptoms or tooth ache.  HM: She has Cologuard kit at home.  She promises to use it and turn it in.  Due for Pap smear and diabetic eye exam.  Last flu shot was in March of this year.  She wants to wait until later in the fall to get flu shot.    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   Spinal stenosis in cervical region (C3-4, C4-5, C5-6) 04/22/2024   Chronic pain syndrome 04/22/2024   Pharmacologic therapy 04/22/2024   Disorder of skeletal system 04/22/2024   Problems influencing health status 04/22/2024   Abnormal MRI, cervical spine (10/15/2023) 04/22/2024   Class 3 severe obesity due to excess calories with serious comorbidity and body mass index (BMI) of 40.0 to 44.9 in adult 09/20/2021   Spondylosis without myelopathy or radiculopathy, cervical region 02/12/2019   Type 2 diabetes mellitus without  complication, without long-term current use of insulin  (HCC) 05/15/2018     Current Outpatient Medications on File Prior to Visit  Medication Sig Dispense Refill   Accu-Chek Softclix Lancets lancets USE TO CHECK BLOOD SUGAR THREE TIMES DAILY AS DIRECTED 100 each 0   amLODipine  (NORVASC ) 10 MG tablet Take 1 tablet (10 mg total) by mouth daily. 90 tablet 1   Blood Glucose Monitoring Suppl (ACCU-CHEK GUIDE) w/Device KIT Check blood sugars 3 times a day before meals. 1 kit 0   Continuous Glucose Receiver (FREESTYLE LIBRE 3 READER) DEVI Use to check glucose continuously. 1 each 0   Continuous Glucose Sensor (FREESTYLE LIBRE 3 SENSOR) MISC Place 1 sensor on the skin every 14 days. Use to check glucose continuously 2 each 6   diclofenac  Sodium (VOLTAREN ) 1 % GEL APPLY TWO grams topically FOUR TIMES DAILY 100 g 0   DULERA  100-5 MCG/ACT AERO Inhale 2 puffs into the lungs 2 (two) times daily. 1 each 6   gabapentin  (NEURONTIN ) 300 MG capsule Take 1 capsule (300 mg total) by mouth at bedtime. 30 capsule 5   glucose blood (ACCU-CHEK GUIDE) test strip USE TO check blood sugar three times daily 100 strip 11   Insulin  Pen Needle (LITETOUCH PEN NEEDLES) 31G X 6 MM MISC USE TO administer insulin  EVERY DAY 100 each 0   Lancets Misc. (ACCU-CHEK FASTCLIX LANCET) KIT Check blood glucose at least 3 times daily. 1 kit 6   losartan  (COZAAR ) 25 MG tablet Take 1 tablet (25 mg total) by mouth daily. 90 tablet 1   methocarbamol  (ROBAXIN ) 500 MG tablet Take 1 tablet (500 mg total) by mouth 2 (two) times daily as needed for muscle spasms. 180 tablet 1   Semaglutide , 2 MG/DOSE, 8 MG/3ML SOPN Inject 2 mg as directed once a week. 3 mL 3   No current facility-administered medications on file prior to visit.    Allergies  Allergen Reactions   Ibuprofen  Nausea And Vomiting    Social History   Socioeconomic History   Marital status: Married    Spouse name: Not on file   Number of children: Not on file   Years of  education: Not on file   Highest education level: Not on file  Occupational History   Not on file  Tobacco Use   Smoking status: Never   Smokeless tobacco: Never  Substance and Sexual Activity   Alcohol use: Yes    Comment: occasionally    Drug use: No   Sexual activity: Not on file  Other Topics Concern   Not on file  Social History Narrative   Not on file   Social Drivers of Health   Financial Resource Strain: Low Risk  (03/11/2024)   Overall Financial Resource Strain (CARDIA)    Difficulty of Paying Living Expenses: Not very hard  Food Insecurity: No Food Insecurity (03/11/2024)   Hunger Vital Sign    Worried About Running Out of Food in the Last Year: Never true  Ran Out of Food in the Last Year: Never true  Transportation Needs: No Transportation Needs (03/11/2024)   PRAPARE - Administrator, Civil Service (Medical): No    Lack of Transportation (Non-Medical): No  Physical Activity: Insufficiently Active (03/11/2024)   Exercise Vital Sign    Days of Exercise per Week: 1 day    Minutes of Exercise per Session: 30 min  Stress: No Stress Concern Present (03/11/2024)   [employer] of Occupational Health - Occupational Stress Questionnaire    Feeling of Stress : Not at all  Social Connections: Moderately Isolated (03/11/2024)   Social Connection and Isolation Panel    Frequency of Communication with Friends and Family: More than three times a week    Frequency of Social Gatherings with Friends and Family: Once a week    Attends Religious Services: Never    Database administrator or Organizations: No    Attends Banker Meetings: Never    Marital Status: Married  Catering manager Violence: Not At Risk (03/11/2024)   Humiliation, Afraid, Rape, and Kick questionnaire    Fear of Current or Ex-Partner: No    Emotionally Abused: No    Physically Abused: No    Sexually Abused: No    Family History  Problem Relation Age of Onset   Hypertension  Mother    Diabetes Mother    Hypertension Father    Diabetes Father    CVA Brother     Past Surgical History:  Procedure Laterality Date   TUBAL LIGATION      ROS: Review of Systems Negative except as stated above  PHYSICAL EXAM: BP 118/74 (BP Location: Left Arm, Patient Position: Sitting, Cuff Size: Normal)   Pulse 100   Temp 98.3 F (36.8 C) (Oral)   Ht 5' 4 (1.626 m)   Wt 268 lb (121.6 kg)   SpO2 96%   BMI 46.00 kg/m   Wt Readings from Last 3 Encounters:  08/14/24 268 lb (121.6 kg)  07/16/24 260 lb (117.9 kg)  06/24/24 260 lb (117.9 kg)    Physical Exam  General appearance - alert, well appearing, middle-aged older African-American female and in no distress Mental status - normal mood, behavior, speech, dress, motor activity, and thought processes Ears - bilateral TM's and external ear canals normal Nose - normal and patent, no erythema, discharge or polyps Mouth - mucous membranes moist, pharynx normal without lesions Neck - supple, no significant adenopathy Lymphatics -small less than pea-sized lymph node felt below and in front of the right ear Chest - clear to auscultation, no wheezes, rales or rhonchi, symmetric air entry Heart - normal rate, regular rhythm, normal S1, S2, no murmurs, rubs, clicks or gallops Extremities - peripheral pulses normal, no pedal edema, no clubbing or cyanosis      Latest Ref Rng & Units 03/11/2024    3:54 PM 09/20/2021    4:17 PM 12/24/2019    3:15 PM  CMP  Glucose 70 - 99 mg/dL 790  775  687   BUN 6 - 24 mg/dL 11  7  13    Creatinine 0.57 - 1.00 mg/dL 9.40  9.33  9.30   Sodium 134 - 144 mmol/L 138  139  137   Potassium 3.5 - 5.2 mmol/L 4.4  4.7  4.8   Chloride 96 - 106 mmol/L 102  100  98   CO2 20 - 29 mmol/L 24  22  23    Calcium 8.7 - 10.2 mg/dL 10.3  10.0  10.6   Total Protein 6.0 - 8.5 g/dL 7.3  7.3  7.4   Total Bilirubin 0.0 - 1.2 mg/dL <9.7  0.3  0.3   Alkaline Phos 44 - 121 IU/L 33  30  33   AST 0 - 40 IU/L 14  22   10    ALT 0 - 32 IU/L 18  27  12     Lipid Panel     Component Value Date/Time   CHOL 134 03/11/2024 1554   TRIG 117 03/11/2024 1554   HDL 74 03/11/2024 1554   CHOLHDL 1.8 03/11/2024 1554   LDLCALC 40 03/11/2024 1554    CBC    Component Value Date/Time   WBC 11.3 (H) 03/11/2024 1554   WBC 9.0 05/15/2018 0843   RBC 4.75 03/11/2024 1554   RBC 4.62 05/15/2018 0843   HGB 14.4 03/11/2024 1554   HCT 44.4 03/11/2024 1554   PLT 408 03/11/2024 1554   MCV 94 03/11/2024 1554   MCH 30.3 03/11/2024 1554   MCH 29.9 05/15/2018 0843   MCHC 32.4 03/11/2024 1554   MCHC 33.5 05/15/2018 0843   RDW 13.3 03/11/2024 1554   LYMPHSABS 3.3 (H) 12/24/2019 1515   EOSABS 0.2 12/24/2019 1515   BASOSABS 0.1 12/24/2019 1515    ASSESSMENT AND PLAN: 1. Type 2 diabetes mellitus with morbid obesity (HCC) (Primary) A1c not at goal and has not changed much since last visit in March. Currently on maximum dose of Ozempic  without significant change in weight or A1c.  She is wanting to change to Mounjaro but looks like this is not covered under her insurance.  Encouraged to continue trying to eat healthy. Encourage to start exercising. She plans to renew gym membership and start going 2 x/wk -Increase glargine insulin  to 27 units daily. Increase metformin  to 1 g twice a day. Continue Ozempic  2 mg once a week. - POCT glycosylated hemoglobin (Hb A1C) - POCT glucose (manual entry) - Ambulatory referral to Ophthalmology - Insulin  Glargine (BASAGLAR  KWIKPEN) 100 UNIT/ML; Inject 27 Units into the skin daily.  Dispense: 15 mL; Refill: 6 - metFORMIN  (GLUCOPHAGE -XR) 500 MG 24 hr tablet; Take 2 tablets (1,000 mg total) by mouth 2 (two) times daily with a meal. TAKE ONE TABLET BY MOUTH EVERY MORNING AND TAKE TWO TABLETS BY MOUTH EVERY IN THE EVENING  Dispense: 360 tablet; Refill: 1  2. Diabetes mellitus treated with oral medication (HCC) 3. Long-term (current) use of injectable non-insulin  antidiabetic drugs 4. Insulin   long-term use (HCC)   5. Hypertension associated with diabetes (HCC) At goal.  Continue Norvasc  10 mg daily and Cozaar  25 mg daily  6. Preauricular lymphadenopathy Observe for now.  I suspect this will resolve in 1 to 2 weeks on its own.  7. Need for shingles vaccine First Shingrix vaccine given today.  Patient was given the opportunity to ask questions.  Patient verbalized understanding of the plan and was able to repeat key elements of the plan.   This documentation was completed using Paediatric nurse.  Any transcriptional errors are unintentional.  Orders Placed This Encounter  Procedures   Ambulatory referral to Ophthalmology   POCT glycosylated hemoglobin (Hb A1C)   POCT glucose (manual entry)     Requested Prescriptions   Signed Prescriptions Disp Refills   Insulin  Glargine (BASAGLAR  KWIKPEN) 100 UNIT/ML 15 mL 6    Sig: Inject 27 Units into the skin daily.   metFORMIN  (GLUCOPHAGE -XR) 500 MG 24 hr tablet 360 tablet 1  Sig: Take 2 tablets (1,000 mg total) by mouth 2 (two) times daily with a meal. TAKE ONE TABLET BY MOUTH EVERY MORNING AND TAKE TWO TABLETS BY MOUTH EVERY IN THE EVENING    Return in about 6 weeks (around 09/25/2024) for PAP.  Barnie Louder, MD, FACP

## 2024-08-20 DIAGNOSIS — F339 Major depressive disorder, recurrent, unspecified: Secondary | ICD-10-CM | POA: Diagnosis not present

## 2024-08-21 DIAGNOSIS — F339 Major depressive disorder, recurrent, unspecified: Secondary | ICD-10-CM | POA: Diagnosis not present

## 2024-08-21 DIAGNOSIS — M47812 Spondylosis without myelopathy or radiculopathy, cervical region: Secondary | ICD-10-CM | POA: Diagnosis not present

## 2024-08-21 DIAGNOSIS — M4802 Spinal stenosis, cervical region: Secondary | ICD-10-CM | POA: Diagnosis not present

## 2024-08-22 DIAGNOSIS — F339 Major depressive disorder, recurrent, unspecified: Secondary | ICD-10-CM | POA: Diagnosis not present

## 2024-08-24 DIAGNOSIS — F339 Major depressive disorder, recurrent, unspecified: Secondary | ICD-10-CM | POA: Diagnosis not present

## 2024-08-26 ENCOUNTER — Telehealth: Payer: Self-pay

## 2024-08-26 ENCOUNTER — Other Ambulatory Visit: Payer: Self-pay | Admitting: Internal Medicine

## 2024-08-26 NOTE — Telephone Encounter (Signed)
 Copied from CRM [id number]. Topic: Clinical - Medication Question >> Aug 26, 2024  3:33 PM Debby BROCKS wrote: Reason for CRM: Patient would like to speak to Dr. Vicci in terms of switching from Ozempic  to Mounjaro  as she confirmed that it is covered by [facility]

## 2024-08-27 ENCOUNTER — Telehealth: Payer: Self-pay

## 2024-08-27 DIAGNOSIS — F339 Major depressive disorder, recurrent, unspecified: Secondary | ICD-10-CM | POA: Diagnosis not present

## 2024-08-27 MED ORDER — TIRZEPATIDE 2.5 MG/0.5ML ~~LOC~~ SOAJ: 2.5000 mg | SUBCUTANEOUS | 0 refills | Status: DC

## 2024-08-27 NOTE — Telephone Encounter (Signed)
 Requested medication (s) are due for refill today:   Yes  Requested medication (s) are on the active medication list:   Yes  Future visit scheduled:   Yes 10/9       Last ordered: 09/06/2023 #100, 11 refills  No protocol assigned    Requested Prescriptions  Pending Prescriptions Disp Refills   ACCU-CHEK GUIDE TEST test strip [Pharmacy Med Name: Accu-Chek Guide test strips] 100 strip 11    Sig: USE TO check blood sugar three times daily     There is no refill protocol information for this order

## 2024-08-27 NOTE — Telephone Encounter (Signed)
 Patient called me asking to have the epidural. Her last one was in July. Per her insurance guidelines we can only request after 3 months which will be October. Patient understands why we must wait for the authorization process. I will submit the first of October.

## 2024-08-27 NOTE — Addendum Note (Signed)
 Addended by: VICCI SOBER B on: 08/27/2024 11:48 AM   Modules accepted: Orders

## 2024-08-27 NOTE — Telephone Encounter (Signed)
 Tell patient to stop Ozempic . Prescription sent to her pharmacy for Mounjaro  2.5 mg to inject once a week.  Have the pharmacist show her how to administer once it is dispensed. Has seen potential side effects including vomiting, nausea, diarrhea/constipation, pancreatitis, bowel blockage, palpitations/heart racing.  Let us  know if she experiences any of these while on the medicine.  Give her a follow-up appointment with Herlene in 1 month for titration.

## 2024-08-28 ENCOUNTER — Other Ambulatory Visit: Payer: Self-pay

## 2024-08-28 DIAGNOSIS — F339 Major depressive disorder, recurrent, unspecified: Secondary | ICD-10-CM | POA: Diagnosis not present

## 2024-08-28 NOTE — Telephone Encounter (Signed)
 Called and spoke to the patient. Verified name & DOB. Informed of message below. Patient expressed verbal understanding and is currently awaiting PA for mounjaro .  At this time Elaine Marquez does not have any availability in the next month. Patient is already scheduled for a pap with you on 09/25/2024. Please advise if it is ok to do a follow-up on Mounjaro  at that time.

## 2024-08-28 NOTE — Telephone Encounter (Signed)
 That would be fine

## 2024-08-29 ENCOUNTER — Telehealth: Payer: Self-pay

## 2024-08-29 ENCOUNTER — Other Ambulatory Visit: Payer: Self-pay

## 2024-08-29 NOTE — Telephone Encounter (Signed)
 Noted! Thank you

## 2024-08-29 NOTE — Telephone Encounter (Signed)
 I sent you a message yesterday in regards to this.  Was patient contacted and informed that I had sent the prescription for the Mounjaro ?

## 2024-08-29 NOTE — Telephone Encounter (Signed)
 Patient states that Mounjaro  is covered under her insurance     Copied from CRM [id number]. Topic: Clinical - Medication Question >> Aug 26, 2024  3:33 PM Debby BROCKS wrote: Reason for CRM: Patient would like to speak to Dr. Vicci in terms of switching from Ozempic  to Mounjaro  as she confirmed that it is covered by [facility] >> Aug 28, 2024 10:55 AM Emylou G wrote: Adv patient of updated info from the doctor >> Aug 27, 2024 10:01 AM Debby BROCKS wrote: Patient following up to see if Dr. Vicci or one of Dr. Ferdie nurses can reach out in regards to the medication

## 2024-08-29 NOTE — Telephone Encounter (Signed)
 Pharmacy Patient Advocate Encounter  Received notification from [employer] that Prior Authorization for MOUNJARO  has been APPROVED from 08/29/2024 to 08/29/2025   PA #/Case ID/Reference #: [id number]

## 2024-09-01 ENCOUNTER — Other Ambulatory Visit: Payer: Self-pay | Admitting: Internal Medicine

## 2024-09-01 DIAGNOSIS — E1169 Type 2 diabetes mellitus with other specified complication: Secondary | ICD-10-CM

## 2024-09-06 DIAGNOSIS — F339 Major depressive disorder, recurrent, unspecified: Secondary | ICD-10-CM | POA: Diagnosis not present

## 2024-09-11 DIAGNOSIS — F339 Major depressive disorder, recurrent, unspecified: Secondary | ICD-10-CM | POA: Diagnosis not present

## 2024-09-12 DIAGNOSIS — F339 Major depressive disorder, recurrent, unspecified: Secondary | ICD-10-CM | POA: Diagnosis not present

## 2024-09-15 DIAGNOSIS — F339 Major depressive disorder, recurrent, unspecified: Secondary | ICD-10-CM | POA: Diagnosis not present

## 2024-09-16 DIAGNOSIS — F339 Major depressive disorder, recurrent, unspecified: Secondary | ICD-10-CM | POA: Diagnosis not present

## 2024-09-23 ENCOUNTER — Other Ambulatory Visit: Payer: Self-pay | Admitting: Internal Medicine

## 2024-09-23 DIAGNOSIS — E1142 Type 2 diabetes mellitus with diabetic polyneuropathy: Secondary | ICD-10-CM

## 2024-09-25 ENCOUNTER — Ambulatory Visit: Admitting: Internal Medicine

## 2024-09-29 ENCOUNTER — Other Ambulatory Visit: Payer: Self-pay | Admitting: Internal Medicine

## 2024-09-29 DIAGNOSIS — I152 Hypertension secondary to endocrine disorders: Secondary | ICD-10-CM

## 2024-10-14 ENCOUNTER — Ambulatory Visit (HOSPITAL_BASED_OUTPATIENT_CLINIC_OR_DEPARTMENT_OTHER): Admitting: Pain Medicine

## 2024-10-14 ENCOUNTER — Ambulatory Visit
Admission: RE | Admit: 2024-10-14 | Discharge: 2024-10-14 | Disposition: A | Discharge status: Home / Self Care | Source: Ambulatory Visit | Attending: Pain Medicine | Admitting: Pain Medicine

## 2024-10-14 ENCOUNTER — Encounter: Payer: Self-pay | Admitting: Pain Medicine

## 2024-10-14 VITALS — BP 133/78 | HR 101 | Temp 98.1°F | Resp 16 | Ht 64.0 in | Wt 258.0 lb

## 2024-10-14 DIAGNOSIS — G8929 Other chronic pain: Secondary | ICD-10-CM

## 2024-10-14 DIAGNOSIS — M542 Cervicalgia: Secondary | ICD-10-CM | POA: Diagnosis not present

## 2024-10-14 DIAGNOSIS — M4802 Spinal stenosis, cervical region: Secondary | ICD-10-CM | POA: Insufficient documentation

## 2024-10-14 DIAGNOSIS — M488X9 Other specified spondylopathies, site unspecified: Secondary | ICD-10-CM

## 2024-10-14 DIAGNOSIS — R937 Abnormal findings on diagnostic imaging of other parts of musculoskeletal system: Secondary | ICD-10-CM | POA: Insufficient documentation

## 2024-10-14 DIAGNOSIS — M2428 Disorder of ligament, vertebrae: Secondary | ICD-10-CM

## 2024-10-14 MED ORDER — ROPIVACAINE HCL 2 MG/ML IJ SOLN
INTRAMUSCULAR | Status: AC
  Filled 2024-10-14: qty 20

## 2024-10-14 MED ORDER — FENTANYL CITRATE (PF) 100 MCG/2ML IJ SOLN
INTRAMUSCULAR | Status: AC
  Filled 2024-10-14: qty 2

## 2024-10-14 MED ORDER — SODIUM CHLORIDE (PF) 0.9 % IJ SOLN
INTRAMUSCULAR | Status: AC
  Filled 2024-10-14: qty 10

## 2024-10-14 MED ORDER — PENTAFLUOROPROP-TETRAFLUOROETH EX AERO
INHALATION_SPRAY | Freq: Once | CUTANEOUS | Status: AC
  Administered 2024-10-14: 30 via TOPICAL

## 2024-10-14 MED ORDER — IOHEXOL 180 MG/ML  SOLN
INTRAMUSCULAR | Status: AC
  Filled 2024-10-14: qty 10

## 2024-10-14 MED ORDER — FENTANYL CITRATE (PF) 100 MCG/2ML IJ SOLN
25.0000 ug | INTRAMUSCULAR | Status: DC | PRN
  Administered 2024-10-14: 50 ug via INTRAVENOUS

## 2024-10-14 MED ORDER — SODIUM CHLORIDE 0.9% FLUSH
1.0000 mL | Freq: Once | INTRAVENOUS | Status: AC
  Administered 2024-10-14: 1 mL

## 2024-10-14 MED ORDER — ROPIVACAINE HCL 2 MG/ML IJ SOLN
1.0000 mL | Freq: Once | INTRAMUSCULAR | Status: AC
  Administered 2024-10-14: 1 mL via EPIDURAL

## 2024-10-14 MED ORDER — LIDOCAINE HCL 2 % IJ SOLN
20.0000 mL | Freq: Once | INTRAMUSCULAR | Status: AC
  Administered 2024-10-14: 100 mg

## 2024-10-14 MED ORDER — MIDAZOLAM HCL 5 MG/5ML IJ SOLN
INTRAMUSCULAR | Status: AC
  Filled 2024-10-14: qty 5

## 2024-10-14 MED ORDER — DEXAMETHASONE SOD PHOSPHATE PF 10 MG/ML IJ SOLN
10.0000 mg | Freq: Once | INTRAMUSCULAR | Status: AC
  Administered 2024-10-14: 10 mg

## 2024-10-14 MED ORDER — LIDOCAINE HCL (PF) 2 % IJ SOLN
INTRAMUSCULAR | Status: AC
  Filled 2024-10-14: qty 10

## 2024-10-14 MED ORDER — IOHEXOL 180 MG/ML  SOLN
10.0000 mL | Freq: Once | INTRAMUSCULAR | Status: AC
  Administered 2024-10-14: 10 mL via EPIDURAL

## 2024-10-14 MED ORDER — MIDAZOLAM HCL 5 MG/5ML IJ SOLN
0.5000 mg | Freq: Once | INTRAMUSCULAR | Status: AC
  Administered 2024-10-14: 2 mg via INTRAVENOUS

## 2024-10-14 NOTE — Patient Instructions (Signed)
 ______________________________________________________________________    Post-Procedure Discharge Instructions  INSTRUCTIONS Apply ice:  Purpose: This will minimize any swelling and discomfort after procedure.  When: Day of procedure, as soon as you get home. How: Fill a plastic sandwich bag with crushed ice. Cover it with a small towel and apply to injection site. How long: (15 min on, 15 min off) Apply for 15 minutes then remove x 15 minutes.  Repeat sequence on day of procedure, until you go to bed. Apply heat:  Purpose: To treat any soreness and discomfort from the procedure. When: Starting the next day after the procedure. How: Apply heat to procedure site starting the day following the procedure. How long: May continue to repeat daily, until discomfort goes away. Food intake: Start with clear liquids (like water ) and advance to regular food, as tolerated.  Physical activities: Keep activities to a minimum for the first 8 hours after the procedure. After that, then as tolerated. Driving: If you have received any sedation, be responsible and do not drive. You are not allowed to drive for 24 hours after having sedation. Blood thinner: (Applies only to those taking blood thinners) You may restart your blood thinner 6 hours after your procedure. Insulin : (Applies only to Diabetic patients taking insulin ) As soon as you can eat, you may resume your normal dosing schedule. Infection prevention: Keep procedure site clean and dry. Shower daily and clean area with soap and water .  PAIN DIARY Post-procedure Pain Diary: Extremely important that this be done correctly and accurately. Recorded information will be used to determine the next step in treatment. For the purpose of accuracy, follow these rules: Evaluate only the area treated. Do not report or include pain from an untreated area. For the purpose of this evaluation, ignore all other areas of pain, except for the treated area. After your  procedure, avoid taking a long nap and attempting to complete the pain diary after you wake up. Instead, set your alarm clock to go off every hour, on the hour, for the initial 8 hours after the procedure. Document the duration of the numbing medicine, and the relief you are getting from it. Do not go to sleep and attempt to complete it later. It will not be accurate. If you received sedation, it is likely that you were given a medication that may cause amnesia. Because of this, completing the diary at a later time may cause the information to be inaccurate. This information is needed to plan your care. Follow-up appointment: Keep your post-procedure follow-up evaluation appointment after the procedure (usually 2 weeks for most procedures, 6 weeks for radiofrequencies). DO NOT FORGET to bring you pain diary with you.   EXPECT... (What should I expect to see with my procedure?) From numbing medicine (AKA: Local Anesthetics): Numbness or decrease in pain. You may also experience some weakness, which if present, could last for the duration of the local anesthetic. Onset: Full effect within 15 minutes of injected. Duration: It will depend on the type of local anesthetic used. On the average, 1 to 8 hours.  From steroids (Applies only if steroids were used): Decrease in swelling or inflammation. Once inflammation is improved, relief of the pain will follow. Onset of benefits: Depends on the amount of swelling present. The more swelling, the longer it will take for the benefits to be seen. In some cases, up to 10 days. Duration: Steroids will stay in the system x 2 weeks. Duration of benefits will depend on multiple posibilities including persistent irritating  factors. Side-effects: If present, they may typically last 2 weeks (the duration of the steroids). Frequent: Cramps (if they occur, drink Gatorade and take over-the-counter Magnesium  450-500 mg once to twice a day); water  retention with temporary weight  gain; increases in blood sugar; decreased immune system response; increased appetite. Occasional: Facial flushing (red, warm cheeks); mood swings; menstrual changes. Uncommon: Long-term decrease or suppression of natural hormones; bone thinning. (These are more common with higher doses or more frequent use. This is why we prefer that our patients avoid having any injection therapies in other practices.)  Very Rare: Severe mood changes; psychosis; aseptic necrosis. From procedure: Some discomfort is to be expected once the numbing medicine wears off. This should be minimal if ice and heat are applied as instructed.  CALL IF... (When should I call?) You experience numbness and weakness that gets worse with time, as opposed to wearing off. New onset bowel or bladder incontinence. (Applies only to procedures done in the spine)  Emergency Numbers: Durning business hours (Monday - Thursday, 8:00 AM - 4:00 PM) (Friday, 9:00 AM - 12:00 Noon): [phone] After hours: [phone] NOTE: If you are having a problem and are unable connect with, or to talk to a provider, then go to your nearest urgent care or emergency department. If the problem is serious and urgent, please call 911. ______________________________________________________________________     ______________________________________________________________________    Steroid injections  Common steroids for injections Triamcinolone : Used by many sports medicine physicians for large joint and bursal injections, often combined with a local anesthetic like lidocaine . A study focusing on coccydynia (tailbone pain) found triamcinolone  was more effective than betamethasone, suggesting it may also be preferable for other localized inflammation conditions. Methylprednisolone : A common alternative to triamcinolone  that is also a strong anti-inflammatory. It is available in different formulations, with the acetate suspension being the long-acting  option for intra-articular injections. Dexamethasone : This is a non-particulate steroid, meaning it has a lower risk of tissue damage compared to particulate steroids like triamcinolone  and methylprednisolone . While less common for this specific use, it is an option for targeted injections.   Considerations for physicians Particulate vs. non-particulate steroids: Triamcinolone  and methylprednisolone  are particulate, meaning they can clump together. Dexamethasone  is non-particulate. Particulate steroids are often preferred for their longer-lasting effects but carry a theoretical higher risk for certain injections (though this is less of a concern in the costochondral joints). Combined injectate: Corticosteroids are typically mixed with a local anesthetic like lidocaine  to provide both immediate pain relief (from the anesthetic) and longer-term inflammation reduction (from the steroid). Imaging guidance: To ensure accurate placement of the needle and medication, physicians may use ultrasound or fluoroscopic guidance for the injection, especially in complex or refractory cases.   Patient guidance Before undergoing a steroid injection, discuss the options with your physician. They will determine the best steroid, dosage, and procedure for your specific case based on factors like: Severity of your condition History of response to other treatments Your overall health status Experience and preference of the physician  Last  Updated: 08/12/2024 ______________________________________________________________________    Moderate Conscious Sedation, Adult, Care After After the procedure, it is common to have: Sleepiness for a few hours. Impaired judgment for a few hours. Trouble with balance. Nausea or vomiting if you eat too soon. Follow these instructions at home: For the time period you were told by your health care provider:  Rest. Do not participate in activities where you could fall or become  injured. Do not drive or  use machinery. Do not drink alcohol. Do not take sleeping pills or medicines that cause drowsiness. Do not make important decisions or sign legal documents. Do not take care of children on your own. Eating and drinking Follow instructions from your health care provider about what you may eat and drink. Drink enough fluid to keep your urine pale yellow. If you vomit: Drink clear fluids slowly and in small amounts as you are able. Clear fluids include water , ice chips, low-calorie sports drinks, and fruit juice that has water  added to it (diluted fruit juice). Eat light and bland foods in small amounts as you are able. These foods include bananas, applesauce, rice, lean meats, toast, and crackers. General instructions Take over-the-counter and prescription medicines only as told by your health care provider. Have a responsible adult stay with you for the time you are told. Do not use any products that contain nicotine or tobacco. These products include cigarettes, chewing tobacco, and vaping devices, such as e-cigarettes. If you need help quitting, ask your health care provider. Return to your normal activities as told by your health care provider. Ask your health care provider what activities are safe for you. Your health care provider may give you more instructions. Make sure you know what you can and cannot do. Contact a health care provider if: You are still sleepy or having trouble with balance after 24 hours. You feel light-headed. You vomit every time you eat or drink. You get a rash. You have a fever. You have redness or swelling around the IV site. Get help right away if: You have trouble breathing. You start to feel confused at home. These symptoms may be an emergency. Get help right away. Call 911. Do not wait to see if the symptoms will go away. Do not drive yourself to the hospital. This information is not intended to replace advice given to you by  your health care provider. Make sure you discuss any questions you have with your health care provider. Document Revised: 06/19/2022 Document Reviewed: 06/19/2022 [employer] Patient Education  2024 [employer].

## 2024-10-14 NOTE — Progress Notes (Addendum)
 PROVIDER NOTE: Interpretation of information contained herein should be left to medically-trained personnel. Specific patient instructions are provided elsewhere under Patient Instructions section of medical record. This document was created in part using STT-dictation technology, any transcriptional errors that may result from this process are unintentional.  Patient: Elaine Marquez Type: Established DOB: [birth year] MRN: [id number] PCP: Vicci Barnie NOVAK, MD  Service: Procedure DOS: 10/14/2024 Setting: Ambulatory Location: Ambulatory outpatient facility Delivery: Face-to-face Provider: Eric DELENA Como, MD Specialty: Interventional Pain Management Specialty designation: 09 Location: Outpatient facility Ref. Prov.: Como Eric, MD       Interventional Therapy   Type: Cervical Epidural Steroid injection (CESI) (Interlaminar) #2  Laterality: Left (-LT)  Level: C7-T1 DOS: 10/14/2024  Provider: Eric DELENA Como, MD Imaging: Fluoroscopy-guided Spinal ([id number]) Anesthesia: Local anesthesia (1-2% Lidocaine ) Anxiolysis: IV Versed  2.0 mg Sedation: Minimal Sedation Fentanyl  1 mL (50 mcg)  Medical Necessity Purpose: Diagnostic/Therapeutic Rationale (medical necessity): procedure needed and proper for the diagnosis and/or treatment of Elaine Marquez's medical symptoms and needs. Indications: Cervicalgia, cervical radicular pain, degenerative disc disease, severe enough to impact quality of life or function. 1. Cervicalgia   2. Chronic neck pain (1ry area of Pain)   3. 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 Spinal stenosis in cervical region (C3-4, C4-5, C5-6)   6. Cervical foraminal stenosis (Bilateral: C3-4, C4-5, C5-6) (Left: C3-4Severe)   7. Cervical facet joint pain   8. Abnormal MRI, cervical spine (10/15/2023)    NAS-11 Pain score:   Pre-procedure: 9 /10   Post-procedure: 0-No pain/10      Position  Prep  Materials:  Location setting: Procedure suite Position: Prone, on modified reverse trendelenburg to facilitate breathing, with head in head-cradle. Pillows positioned under chest (below chin-level) with cervical spine flexed. Safety Precautions: Patient was assessed for positional comfort and pressure points before starting the procedure. Prepping solution: DuraPrep (Iodine Povacrylex [0.7% available iodine] and Isopropyl Alcohol, 74% w/w) Prep Area: Entire  cervicothoracic region Approach: percutaneous, paramedial Intended target: Posterior cervical epidural space Materials Procedure:  Tray: Epidural Needle(s): Epidural (Tuohy) Qty: 1 Length: (90mm) 3.5-inch Gauge: 17G  H&P (Pre-op Assessment):  Elaine Marquez is a 53 y.o. (year old), female patient, seen today for interventional treatment. She  has a past surgical history that includes Tubal ligation. Elaine Marquez has a current medication list which includes the following prescription(s): accu-chek guide test, accu-chek softclix lancets, amlodipine , accu-chek guide, freestyle libre 3 reader, freestyle libre 3 sensor, diclofenac  sodium, dulera , gabapentin , basaglar  kwikpen, litetouch pen needles, accu-chek fastclix lancet, losartan , metformin , tirzepatide , and methocarbamol , and the following Facility-Administered Medications: fentanyl . Her primarily concern today is the Neck Pain  Initial Vital Signs:  Pulse/HCG Rate: (!) 101ECG Heart Rate: 99 (nsr) Temp: (!) 97.4 F (36.3 C) Resp: 20 BP: 133/77 SpO2: 93 %  BMI: Estimated body mass index is 44.29 kg/m as calculated from the following:   Height as of this encounter: 5' 4 (1.626 m).   Weight as of this encounter: 258 lb (117 kg).  Risk Assessment: Allergies: Reviewed. She is allergic to ibuprofen .  Allergy Precautions: None required Coagulopathies: Reviewed. None identified.  Blood-thinner therapy: None at this time Active Infection(s): Reviewed. None identified.  Elaine Marquez is afebrile  Site Confirmation: Elaine Marquez was asked to confirm the procedure and laterality before marking the site Procedure checklist: Completed Consent: Before the procedure and under the influence of no sedative(s), amnesic(s), or anxiolytics, the patient was informed of the treatment  options, risks and possible complications. To fulfill our ethical and legal obligations, as recommended by the American Medical Association's Code of Ethics, I have informed the patient of my clinical impression; the nature and purpose of the treatment or procedure; the risks, benefits, and possible complications of the intervention; the alternatives, including doing nothing; the risk(s) and benefit(s) of the alternative treatment(s) or procedure(s); and the risk(s) and benefit(s) of doing nothing. The patient was provided information about the general risks and possible complications associated with the procedure. These may include, but are not limited to: failure to achieve desired goals, infection, bleeding, organ or nerve damage, allergic reactions, paralysis, and death. In addition, the patient was informed of those risks and complications associated to Spine-related procedures, such as failure to decrease pain; infection (i.e.: Meningitis, epidural or intraspinal abscess); bleeding (i.e.: epidural hematoma, subarachnoid hemorrhage, or any other type of intraspinal or peri-dural bleeding); organ or nerve damage (i.e.: Any type of peripheral nerve, nerve root, or spinal cord injury) with subsequent damage to sensory, motor, and/or autonomic systems, resulting in permanent pain, numbness, and/or weakness of one or several areas of the body; allergic reactions; (i.e.: anaphylactic reaction); and/or death. Furthermore, the patient was informed of those risks and complications associated with the medications. These include, but are not limited to: allergic reactions (i.e.: anaphylactic or anaphylactoid  reaction(s)); adrenal axis suppression; blood sugar elevation that in diabetics may result in ketoacidosis or comma; water retention that in patients with history of congestive heart failure may result in shortness of breath, pulmonary edema, and decompensation with resultant heart failure; weight gain; swelling or edema; medication-induced neural toxicity; particulate matter embolism and blood vessel occlusion with resultant organ, and/or nervous system infarction; and/or aseptic necrosis of one or more joints. Finally, the patient was informed that Medicine is not an exact science; therefore, there is also the possibility of unforeseen or unpredictable risks and/or possible complications that may result in a catastrophic outcome. The patient indicated having understood very clearly. We have given the patient no guarantees and we have made no promises. Enough time was given to the patient to ask questions, all of which were answered to the patient's satisfaction. Ms. Badolato has indicated that she wanted to continue with the procedure. Attestation: I, the ordering provider, attest that I have discussed with the patient the benefits, risks, side-effects, alternatives, likelihood of achieving goals, and potential problems during recovery for the procedure that I have provided informed consent. Date  Time: 10/14/2024 10:05 AM  Pre-Procedure Preparation:  Monitoring: As per clinic protocol. Respiration, ETCO2, SpO2, BP, heart rate and rhythm monitor placed and checked for adequate function Safety Precautions: Patient was assessed for positional comfort and pressure points before starting the procedure. Time-out: I initiated and conducted the Time-out before starting the procedure, as per protocol. The patient was asked to participate by confirming the accuracy of the Time Out information. Verification of the correct person, site, and procedure were performed and confirmed by me, the nursing staff, and the  patient. Time-out conducted as per Joint Commission's Universal Protocol (UP.01.01.01). Time: 1110 Start Time: 1110 hrs.  Description  Narrative of Procedure:          Rationale (medical necessity): procedure needed and proper for the diagnosis and/or treatment of the patient's medical symptoms and needs. Start Time: 1110 hrs. Safety Precautions: Aspiration looking for blood return was conducted prior to all injections. At no point did we inject any substances, as a needle was being advanced. No attempts were made  at seeking any paresthesias. Safe injection practices and needle disposal techniques used. Medications properly checked for expiration dates. SDV (single dose vial) medications used. Description of procedure: Protocol guidelines were followed. The patient was assisted into a comfortable position. The target area was identified and the area prepped in the usual manner. Skin & deeper tissues infiltrated with local anesthetic. Appropriate amount of time allowed to pass for local anesthetics to take effect. Using fluoroscopic guidance, the epidural needle was introduced through the skin, ipsilateral to the reported pain, and advanced to the target area. Posterior laminar os was contacted and the needle walked caudad, until the lamina was cleared. The ligamentum flavum was engaged and the epidural space identified using "loss-of-resistance technique" with 2-3 ml of PF-NaCl (0.9% NSS), in a 5cc dedicated LOR syringe. (See Imaging guidance below for use of contrast details.) Once proper needle placement was secured, and negative aspiration confirmed, the solution was injected in intermittent fashion, asking for systemic symptoms every 0.5cc. The needles were then removed and the area cleansed, making sure to leave some of the prepping solution back to take advantage of its long term bactericidal properties.  Vitals:   10/14/24 1129 10/14/24 1139 10/14/24 1149 10/14/24 1159  BP: (!) 157/108 (!)  146/88 121/79 133/78  Pulse:      Resp: 18 20 17 16   Temp:    98.1 F (36.7 C)  SpO2: 100% 96% 96% 95%  Weight:      Height:         End Time:   hrs.  Imaging Guidance (Spinal):          Type of Imaging Technique: Fluoroscopy Guidance (Spinal) Indication(s): Fluoroscopy guidance for needle placement to enhance accuracy in procedures requiring precise needle localization for targeted delivery of medication in or near specific anatomical locations not easily accessible without such real-time imaging assistance. Exposure Time: Please see nurses notes. Contrast: Before injecting any contrast, we confirmed that the patient did not have an allergy to iodine, shellfish, or radiological contrast. Once satisfactory needle placement was completed at the desired level, radiological contrast was injected. Contrast injected under live fluoroscopy. No contrast complications. See chart for type and volume of contrast used. Fluoroscopic Guidance: I was personally present during the use of fluoroscopy. Tunnel Vision Technique used to obtain the best possible view of the target area. Parallax error corrected before commencing the procedure. Direction-depth-direction technique used to introduce the needle under continuous pulsed fluoroscopy. Once target was reached, antero-posterior, oblique, and lateral fluoroscopic projection used confirm needle placement in all planes. Images permanently stored in EMR. Interpretation: I personally interpreted the imaging intraoperatively. Adequate needle placement confirmed in multiple planes. Appropriate spread of contrast into desired area was observed. No evidence of afferent or efferent intravascular uptake. No intrathecal or subarachnoid spread observed. Permanent images saved into the patient's record.  Post-operative Assessment:  Post-procedure Vital Signs:  Pulse/HCG Rate: (!) 10195 Temp: 98.1 F (36.7 C) Resp: 16 BP: 133/78 SpO2: 95 %  EBL:  None  Complications: No immediate post-treatment complications observed by team, or reported by patient.  Note: The patient tolerated the entire procedure well. A repeat set of vitals were taken after the procedure and the patient was kept under observation following institutional policy, for this type of procedure. Post-procedural neurological assessment was performed, showing return to baseline, prior to discharge. The patient was provided with post-procedure discharge instructions, including a section on how to identify potential problems. Should any problems arise concerning this procedure, the patient was  given instructions to immediately contact us , at any time, without hesitation. In any case, we plan to contact the patient by telephone for a follow-up status report regarding this interventional procedure.  Comments:  No additional relevant information.  Plan of Care (POC)  Orders:  Orders Placed This Encounter  Procedures   Cervical Epidural Injection    Indication(s): Radiculitis and cervicalgia associated with cervical degenerative disc disease. Position: Prone Imaging guidance: Fluoroscopy required. Contrast required unless contraindicated by allergy or severe CKD. Equipment & Materials: Epidural tray & needle.    Scheduling Instructions:     Procedure: Cervical Epidural Steroid Injection/Block     Planned Level(s): C7-T1     Laterality: Left-sided     Anxiolysis: Patient's choice.     Date: 10/14/2024    Where will this procedure be performed?:   [facility]             by Dr. [facility] 1-60 MIN NO REPORT    Intraoperative interpretation by procedural physician at [facility].    Standing Status:   Standing    Number of Occurrences:   1    Reason for exam::   Assistance in needle guidance and placement for procedures requiring needle placement in or near specific anatomical locations not easily accessible without such assistance.    Informed Consent Details: Physician/Practitioner Attestation; Transcribe to consent form and obtain patient signature    Nursing instructions: Transcribe to consent form and obtain patient signature. Always confirm laterality of pain with Ms. Hartzog, before procedure.    Physician/Practitioner attestation of informed consent for procedure/surgical case:   I, the physician/practitioner, attest that I have discussed with the patient the benefits, risks, side effects, alternatives, likelihood of achieving goals and potential problems during recovery for the procedure that I have provided informed consent.    Procedure:   Cervical Epidural Steroid Injection (CESI) under fluoroscopic guidance    Physician/Practitioner performing the procedure:   Neyla Gauntt A. Tanya MD    Indication/Reason:   Indications: Cervicalgia (neck pain), cervical radicular pain, radiculitis (arm/shoulder pain, numbness, and/or weakness), degenerative disc disease, severe enough to greatly impact quality of life or function.   Provide equipment / supplies at bedside    Procedural tray: Epidural Tray (Disposable  single use) Skin infiltration needle: Regular 1.5-in, 25-G, (x1) Block needle size: Regular standard Catheter: No catheter required    Standing Status:   Standing    Number of Occurrences:   1    Specify:   Epidural Tray   Saline lock IV    Have LR [phone] mL available and administer at 125 mL/hr if patient becomes hypotensive.    Standing Status:   Standing    Number of Occurrences:   1     Opioid Analgesic: Tramadol  Hcl 50 Mg Tablet  MME/day: 40 mg/day    Medications ordered for procedure: Meds ordered this encounter  Medications   iohexol  (OMNIPAQUE ) 180 MG/ML injection 10 mL    Must be Myelogram-compatible. If not available, you may substitute with a water-soluble, non-ionic, hypoallergenic, myelogram-compatible radiological contrast medium.   lidocaine  (XYLOCAINE ) 2 % (with pres) injection 400 mg    pentafluoroprop-tetrafluoroeth (GEBAUERS) aerosol   midazolam  (VERSED ) 5 MG/5ML injection 0.5-2 mg    Make sure Flumazenil is available in the pyxis when using this medication. If oversedation occurs, administer 0.2 mg IV over 15 sec. If after 45 sec no response, administer 0.2 mg again over 1 min; may repeat  at 1 min intervals; not to exceed 4 doses (1 mg)   fentaNYL  (SUBLIMAZE ) injection 25-50 mcg    Make sure Narcan is available in the pyxis when using this medication. In the event of respiratory depression (RR< 8/min): Titrate NARCAN (naloxone) in increments of 0.1 to 0.2 mg IV at 2-3 minute intervals, until desired degree of reversal.   sodium chloride  flush (NS) 0.9 % injection 1 mL   ropivacaine  (PF) 2 mg/mL (0.2%) (NAROPIN ) injection 1 mL   dexamethasone  (DECADRON ) injection 10 mg   Medications administered: We administered iohexol , lidocaine , pentafluoroprop-tetrafluoroeth, midazolam , fentaNYL , sodium chloride  flush, ropivacaine  (PF) 2 mg/mL (0.2%), and dexamethasone .  See the medical record for exact dosing, route, and time of administration.    Interventional Therapies  Risk Factors  Considerations  Medical Comorbidities:     Planned  Pending:   Diagnostic/therapeutic left cervical ESI #2 (10/14/2024)    Under consideration:   Diagnostic/therapeutic left cervical ESI #2  Diagnostic/therapeutic bilateral cervical facet MBB #1    Completed: (Analgesic benefit)1  Diagnostic/therapeutic left cervical ESI x1 (06/24/2024) (100/100/75/75)    Therapeutic  Palliative (PRN) options:   None established   Completed by other providers:   Procedure (s) by [facility] Tedi MD)  Date  Non Fac Inj facet jnt/nrv, cerv/thor sngl Mar-03-2024  Non Fac Inj facet jnt/nrv, cerv/thor Second Level Mar-03-2024  Non Fac Inj facet jnt/nrv, cerv/thor sngl Feb-24-2025  Non Fac Inj facet jnt/nrv, cerv/thor Second Level Feb-24-2025  Non Fac Destruction By Neuro Agt. Cerv Or Abigail Achille Menghini [phone]  Non Fac Destruction Neuro Agt, Bradly Or Abigail Add Level Feb-13-2025  Non Fac Inj facet jnt/nrv, cerv/thor sngl Jan-23-2025  Non Fac Inj facet jnt/nrv, cerv/thor Second Level Jan-23-2025    1(Analgesic benefit): Expressed in percentage (%). (Local anesthetic[LA] +/- sedation  L.A.Local Anesthetic  Steroid benefit  Ongoing benefit)      Follow-up plan:   Return in 2 weeks (on 10/28/2024) for (ECT): (L) CESI #3.     Recent Visits Date Type Provider Dept  07/16/24 Office Visit Tanya Glisson, MD [facility]  Showing recent visits within past 90 days and meeting all other requirements Today's Visits Date Type Provider Dept  10/14/24 Procedure visit Tanya Glisson, MD [facility]  Showing today's visits and meeting all other requirements Future Appointments Date Type Provider Dept  10/28/24 Appointment Tanya Glisson, MD [facility]  Showing future appointments within next 90 days and meeting all other requirements   Disposition: Discharge home  Discharge (Date  Time): 10/14/2024; 1200 hrs.   Primary Care Physician: Vicci Barnie NOVAK, MD Location: [facility] Note by: Glisson DELENA Tanya, MD (TTS technology used. I apologize for any typographical errors that were not detected and corrected.) Date: 10/14/2024; Time: 12:35 PM  Disclaimer:  Medicine is not an visual merchandiser. The only guarantee in medicine is that nothing is guaranteed. It is important to note that the decision to proceed with this intervention was based on the information collected from the patient. The Data and conclusions were drawn from the patient's questionnaire, the interview, and the physical examination. Because the information was provided in large part by the patient, it cannot be guaranteed that it has not been purposely or unconsciously manipulated. Every effort has been made to obtain as much relevant data as possible for  this evaluation. It is important to note that the conclusions that lead to this procedure are derived in large part from the available data. Always  take into account that the treatment will also be dependent on availability of resources and existing treatment guidelines, considered by other Pain Management Practitioners as being common knowledge and practice, at the time of the intervention. For Medico-Legal purposes, it is also important to point out that variation in procedural techniques and pharmacological choices are the acceptable norm. The indications, contraindications, technique, and results of the above procedure should only be interpreted and judged by a Board-Certified Interventional Pain Specialist with extensive familiarity and expertise in the same exact procedure and technique.

## 2024-10-14 NOTE — Progress Notes (Signed)
 Safety precautions to be maintained throughout the outpatient stay will include: orient to surroundings, keep bed in low position, maintain call bell within reach at all times, provide assistance with transfer out of bed and ambulation.

## 2024-10-15 ENCOUNTER — Telehealth: Payer: Self-pay | Admitting: *Deleted

## 2024-10-15 DIAGNOSIS — F339 Major depressive disorder, recurrent, unspecified: Secondary | ICD-10-CM | POA: Diagnosis not present

## 2024-10-15 NOTE — Telephone Encounter (Signed)
 Post procedure call:   no  questions or concerns.

## 2024-10-16 ENCOUNTER — Telehealth: Payer: Self-pay | Admitting: Internal Medicine

## 2024-10-16 DIAGNOSIS — F339 Major depressive disorder, recurrent, unspecified: Secondary | ICD-10-CM | POA: Diagnosis not present

## 2024-10-16 NOTE — Telephone Encounter (Unsigned)
 Copied from CRM [id number]. Topic: Clinical - Medication Refill >> Oct 16, 2024 11:02 AM Myrick T wrote: Medication: tirzepatide  (MOUNJARO ) dosage increase 7mg   Has the patient contacted their pharmacy? No  This is the patient's preferred pharmacy:  [facility] - [place], [place] - [address] [place] [address] Phone: [phone] Fax: [phone]  Is this the correct pharmacy for this prescription? Yes  Has the prescription been filled recently? Yes  Is the patient out of the medication? Yes  Has the patient been seen for an appointment in the last year OR does the patient have an upcoming appointment? Yes  Can we respond through MyChart? Yes  Agent: Please be advised that Rx refills may take up to 3 business days. We ask that you follow-up with your pharmacy.

## 2024-10-21 ENCOUNTER — Ambulatory Visit: Attending: Internal Medicine | Admitting: Internal Medicine

## 2024-10-21 ENCOUNTER — Encounter: Payer: Self-pay | Admitting: Internal Medicine

## 2024-10-21 DIAGNOSIS — Z794 Long term (current) use of insulin: Secondary | ICD-10-CM

## 2024-10-21 DIAGNOSIS — M542 Cervicalgia: Secondary | ICD-10-CM

## 2024-10-21 DIAGNOSIS — I152 Hypertension secondary to endocrine disorders: Secondary | ICD-10-CM

## 2024-10-21 DIAGNOSIS — G8929 Other chronic pain: Secondary | ICD-10-CM | POA: Diagnosis not present

## 2024-10-21 DIAGNOSIS — E119 Type 2 diabetes mellitus without complications: Secondary | ICD-10-CM

## 2024-10-21 DIAGNOSIS — E1159 Type 2 diabetes mellitus with other circulatory complications: Secondary | ICD-10-CM | POA: Diagnosis not present

## 2024-10-21 DIAGNOSIS — Z23 Encounter for immunization: Secondary | ICD-10-CM

## 2024-10-21 DIAGNOSIS — Z7985 Long-term (current) use of injectable non-insulin antidiabetic drugs: Secondary | ICD-10-CM | POA: Diagnosis not present

## 2024-10-21 DIAGNOSIS — E1169 Type 2 diabetes mellitus with other specified complication: Secondary | ICD-10-CM | POA: Diagnosis not present

## 2024-10-21 DIAGNOSIS — Z6841 Body Mass Index (BMI) 40.0 and over, adult: Secondary | ICD-10-CM

## 2024-10-21 DIAGNOSIS — Z7984 Long term (current) use of oral hypoglycemic drugs: Secondary | ICD-10-CM | POA: Diagnosis not present

## 2024-10-21 DIAGNOSIS — F339 Major depressive disorder, recurrent, unspecified: Secondary | ICD-10-CM | POA: Diagnosis not present

## 2024-10-21 MED ORDER — TIRZEPATIDE 5 MG/0.5ML ~~LOC~~ SOAJ: 5.0000 mg | SUBCUTANEOUS | 0 refills | Status: DC

## 2024-10-21 MED ORDER — BASAGLAR KWIKPEN 100 UNIT/ML ~~LOC~~ SOPN: 31.0000 [IU] | PEN_INJECTOR | Freq: Every day | SUBCUTANEOUS | 6 refills | Status: AC

## 2024-10-21 NOTE — Progress Notes (Signed)
 Patient ID: Elaine Marquez, female    DOB: 04-08-1971  MRN: [id number]  CC: Follow-up (Mounjaro  f/u - requesting a higher dose/Requesting refill for Dulera /Declines Pap/Flu vax administered on 10/21/2024 - C.A.)   Subjective: Elaine Marquez is a 53 y.o. female who presents for chronic ds management. 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  History of Present Illness Elaine Marquez is a 53 year old female with diabetes who presents for follow-up on her diabetes management.  DM: She has been managing her diabetes with a recent change in medication from Ozempic  to Mounjaro , which she started approximately one month ago. She is currently on a dose of 2.5 mg, which she feels is insufficient. No gastrointestinal side effects with Mounjaro , unlike with Ozempic , which caused stomach upset. Her appetite has not decreased on the current dose. She is also on metformin , taking 1000 mg in the morning and 1000 mg in the evening, and glargine insulin  at 27 units at night. She uses a continuous glucose monitor (CGM) to track her blood sugars, which recently fell off yesterday, causing some frustration as it had eight days left. Her last recorded A1c was 9.1% at the end of August.  She received a steroid injection in her neck a week ago, which caused her blood sugar levels to spike significantly. Over the past seven days, her time in range was 48%, with 28% of the time in the high range (81-250 mg/dL) and 75% of the time above 250 mg/dL. Over the past month, her time in target range was 40%, with 44% in the high range and 16% very high.  Average blood sugar readings in the mornings over the past month have been 184-186.  Regarding her diet, she  avoids sugary foods but notes that fruits can raise her blood sugar, although it does not stay elevated for long. She identifies starches as a significant factor in raising her blood sugar. She does not consume much cake or sweets, preferring fruits instead.  Her blood pressure was elevated today on initial check, which she attributes to neck pain. She takes amlodipine  10 mg and Cozaar  25 mg for blood pressure management. She does not add salt to her food and has encouraged her husband to reduce his salt intake as well.  HM: Diabetic eye exam is scheduled for February.  She still has the Cologuard kit at home.  She promises to use it and send it in.  Yes to flu vaccine today.  Due for her second shingles vaccine but wants to defer on getting that for now.     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/2024   Spinal stenosis in cervical region (C3-4, C4-5, C5-6) 04/22/2024   Chronic pain syndrome 04/22/2024  Pharmacologic therapy 04/22/2024   Disorder of skeletal system 04/22/2024   Problems influencing health status 04/22/2024   Abnormal MRI, cervical spine (10/15/2023) 04/22/2024   Class 3 severe obesity due to excess calories with serious comorbidity and body mass index (BMI) of 40.0 to 44.9 in adult [facility]) 09/20/2021   Spondylosis without myelopathy or radiculopathy, cervical region 02/12/2019   Type 2 diabetes mellitus without complication, without  long-term current use of insulin  (HCC) 05/15/2018     Current Outpatient Medications on File Prior to Visit  Medication Sig Dispense Refill   ACCU-CHEK GUIDE TEST test strip USE TO check blood sugar three times daily 100 strip 11   Accu-Chek Softclix Lancets lancets USE TO CHECK BLOOD SUGAR THREE TIMES DAILY AS DIRECTED 100 each 0   amLODipine  (NORVASC ) 10 MG tablet Take 1 tablet (10 mg total) by mouth daily. 90 tablet 1   Blood Glucose Monitoring Suppl (ACCU-CHEK GUIDE) w/Device KIT Check blood sugars 3 times a day before meals. 1 kit 0   Continuous Glucose Receiver (FREESTYLE LIBRE 3 READER) DEVI Use to check glucose continuously. 1 each 0   Continuous Glucose Sensor (FREESTYLE LIBRE 3 SENSOR) MISC Place 1 sensor on the skin every 14 days. Use to check glucose continuously 2 each 6   diclofenac  Sodium (VOLTAREN ) 1 % GEL APPLY TWO grams topically FOUR TIMES DAILY 100 g 0   DULERA  100-5 MCG/ACT AERO Inhale 2 puffs into the lungs 2 (two) times daily. 1 each 6   gabapentin  (NEURONTIN ) 300 MG capsule TAKE ONE CAPSULE (300mg  total) BY MOUTH AT BEDTIME 30 capsule 5   Insulin  Pen Needle (LITETOUCH PEN NEEDLES) 31G X 6 MM MISC USE TO administer insulin  EVERY DAY 100 each 0   Lancets Misc. (ACCU-CHEK FASTCLIX LANCET) KIT Check blood glucose at least 3 times daily. 1 kit 6   losartan  (COZAAR ) 25 MG tablet Take 1 tablet (25 mg total) by mouth daily. 90 tablet 1   metFORMIN  (GLUCOPHAGE -XR) 500 MG 24 hr tablet Take 2 tablets (1,000 mg total) by mouth 2 (two) times daily with a meal. TAKE ONE TABLET BY MOUTH EVERY MORNING AND TAKE TWO TABLETS BY MOUTH EVERY IN THE EVENING 360 tablet 1   methocarbamol  (ROBAXIN ) 500 MG tablet Take 1 tablet (500 mg total) by mouth 2 (two) times daily as needed for muscle spasms. (Patient not taking: Reported on 10/21/2024) 180 tablet 1   No current facility-administered medications on file prior to visit.    Allergies  Allergen Reactions   Ibuprofen  Nausea And Vomiting     Social History   Socioeconomic History   Marital status: Married    Spouse name: Not on file   Number of children: Not on file   Years of education: Not on file   Highest education level: Not on file  Occupational History   Not on file  Tobacco Use   Smoking status: Never   Smokeless tobacco: Never  Substance and Sexual Activity   Alcohol use: Yes    Comment: occasionally    Drug use: No   Sexual activity: Not on file  Other Topics Concern   Not on file  Social History Narrative   Not on file   Social Drivers of Health   Financial Resource Strain: Low Risk  (03/11/2024)   Overall Financial Resource Strain (CARDIA)    Difficulty of Paying Living Expenses: Not very hard  Food Insecurity: No Food Insecurity (03/11/2024)   Hunger Vital Sign    Worried About Running  Out of Food in the Last Year: Never true    Ran Out of Food in the Last Year: Never true  Transportation Needs: No Transportation Needs (03/11/2024)   PRAPARE - Administrator, Civil Service (Medical): No    Lack of Transportation (Non-Medical): No  Physical Activity: Insufficiently Active (03/11/2024)   Exercise Vital Sign    Days of Exercise per Week: 1 day    Minutes of Exercise per Session: 30 min  Stress: No Stress Concern Present (03/11/2024)   [employer] of Occupational Health - Occupational Stress Questionnaire    Feeling of Stress : Not at all  Social Connections: Moderately Isolated (03/11/2024)   Social Connection and Isolation Panel    Frequency of Communication with Friends and Family: More than three times a week    Frequency of Social Gatherings with Friends and Family: Once a week    Attends Religious Services: Never    Database Administrator or Organizations: No    Attends Banker Meetings: Never    Marital Status: Married  Catering Manager Violence: Not At Risk (03/11/2024)   Humiliation, Afraid, Rape, and Kick questionnaire    Fear of Current or Ex-Partner:  No    Emotionally Abused: No    Physically Abused: No    Sexually Abused: No    Family History  Problem Relation Age of Onset   Hypertension Mother    Diabetes Mother    Hypertension Father    Diabetes Father    CVA Brother     Past Surgical History:  Procedure Laterality Date   TUBAL LIGATION      ROS: Review of Systems Negative except as stated above  PHYSICAL EXAM: BP 112/76   Pulse 98   Ht 5' 4 (1.626 m)   Wt 271 lb (122.9 kg)   SpO2 94%   BMI 46.52 kg/m   Wt Readings from Last 3 Encounters:  10/21/24 271 lb (122.9 kg)  10/14/24 258 lb (117 kg)  08/14/24 268 lb (121.6 kg)    Physical Exam  General appearance - alert, well appearing, and in no distress Mental status - normal mood, behavior, speech, dress, motor activity, and thought processes Chest - clear to auscultation, no wheezes, rales or rhonchi, symmetric air entry Heart - normal rate, regular rhythm, normal S1, S2, no murmurs, rubs, clicks or gallops Extremities - peripheral pulses normal, no pedal edema, no clubbing or cyanosis      Latest Ref Rng & Units 03/11/2024    3:54 PM 09/20/2021    4:17 PM 12/24/2019    3:15 PM  CMP  Glucose 70 - 99 mg/dL 790  775  687   BUN 6 - 24 mg/dL 11  7  13    Creatinine 0.57 - 1.00 mg/dL 9.40  9.33  9.30   Sodium 134 - 144 mmol/L 138  139  137   Potassium 3.5 - 5.2 mmol/L 4.4  4.7  4.8   Chloride 96 - 106 mmol/L 102  100  98   CO2 20 - 29 mmol/L 24  22  23    Calcium 8.7 - 10.2 mg/dL 89.6  89.9  89.3   Total Protein 6.0 - 8.5 g/dL 7.3  7.3  7.4   Total Bilirubin 0.0 - 1.2 mg/dL <9.7  0.3  0.3   Alkaline Phos 44 - 121 IU/L 33  30  33   AST 0 - 40 IU/L 14  22  10    ALT 0 -  32 IU/L 18  27  12     Lipid Panel     Component Value Date/Time   CHOL 134 03/11/2024 1554   TRIG 117 03/11/2024 1554   HDL 74 03/11/2024 1554   CHOLHDL 1.8 03/11/2024 1554   LDLCALC 40 03/11/2024 1554    CBC    Component Value Date/Time   WBC 11.3 (H) 03/11/2024 1554   WBC 9.0  05/15/2018 0843   RBC 4.75 03/11/2024 1554   RBC 4.62 05/15/2018 0843   HGB 14.4 03/11/2024 1554   HCT 44.4 03/11/2024 1554   PLT 408 03/11/2024 1554   MCV 94 03/11/2024 1554   MCH 30.3 03/11/2024 1554   MCH 29.9 05/15/2018 0843   MCHC 32.4 03/11/2024 1554   MCHC 33.5 05/15/2018 0843   RDW 13.3 03/11/2024 1554   LYMPHSABS 3.3 (H) 12/24/2019 1515   EOSABS 0.2 12/24/2019 1515   BASOSABS 0.1 12/24/2019 1515    ASSESSMENT AND PLAN: 1. Type 2 diabetes mellitus with morbid obesity (HCC) (Primary) Blood sugar readings are not at goal.  Recent increases reported due to steroid inj to neck last wk but BS were still not at goal prior to that based on CGM readings. -Encouraged her to continue trying to eat healthy. Went over fruits that have lower glycemic index foods apples, strawberries and berries. -Increase glargine insulin  to 31 units daily. Continue Metformin  1 gam BID  Increase Mounjaro  to 5 mg once a week.  This dose for 1 month, if she is tolerating it without any issues, she will let me know so that we can increase to the next level. - tirzepatide  (MOUNJARO ) 5 MG/0.5ML Pen; Inject 5 mg into the skin once a week.  Dispense: 6 mL; Refill: 0 - Insulin  Glargine (BASAGLAR  KWIKPEN) 100 UNIT/ML; Inject 31 Units into the skin daily.  Dispense: 15 mL; Refill: 6  2. Diabetes mellitus treated with oral medication (HCC) 3. Long-term (current) use of injectable non-insulin  antidiabetic drugs 4. Insulin  long-term use (HCC) See #1 above  5. Hypertension associated with diabetes (HCC) At goal.  Continue Cozaar  25 mg daily and Norvasc  10 mg daily.  6. Chronic neck pain See pain management  7. Need for influenza vaccination - Flu vaccine trivalent PF, 6mos and older(Flulaval,Afluria,Fluarix,Fluzone)  HM: Encouraged her to please use the Cologuard kit and send it in.  We will plan to give the second Shingrix vaccine at next visit    Patient was given the opportunity to ask questions.   Patient verbalized understanding of the plan and was able to repeat key elements of the plan.   This documentation was completed using Paediatric nurse.  Any transcriptional errors are unintentional.  Orders Placed This Encounter  Procedures   Flu vaccine trivalent PF, 6mos and older(Flulaval,Afluria,Fluarix,Fluzone)     Requested Prescriptions   Signed Prescriptions Disp Refills   tirzepatide  (MOUNJARO ) 5 MG/0.5ML Pen 6 mL 0    Sig: Inject 5 mg into the skin once a week.   Insulin  Glargine (BASAGLAR  KWIKPEN) 100 UNIT/ML 15 mL 6    Sig: Inject 31 Units into the skin daily.    Return in about 3 months (around 01/21/2025).  Barnie Louder, MD, FACP

## 2024-10-21 NOTE — Patient Instructions (Signed)
  VISIT SUMMARY: You came in today for a follow-up on your diabetes management. We discussed your current medications, recent changes, and how your blood sugar levels have been affected by a recent steroid injection. We also reviewed your blood pressure, neck pain, and general health maintenance needs.  YOUR PLAN: -TYPE 2 DIABETES MELLITUS WITH SUBOPTIMAL CONTROL: Your diabetes is not as well controlled as we would like, with your A1c at 9.1%. This is partly due to a recent steroid injection. We have increased your Mounjaro  dose to 5 mg and your glycine insulin  to 31 units at night. Continue taking metformin  1000 mg twice daily and monitor your blood glucose levels with your CGM. We also discussed dietary changes, especially regarding fruit intake and its impact on your blood sugar.  -HYPERTENSION: Your blood pressure is good. Continue taking your current medications, amlodipine  and Cozaar , and try to reduce your salt intake.   -GENERAL HEALTH MAINTENANCE: You are due for a diabetic eye exam in February. You have received one dose of the shingles vaccine and need to complete the series. You also received your flu vaccine today.  INSTRUCTIONS: Please schedule your diabetic eye exam for February. Make sure to get the second dose of the shingles vaccine. Continue monitoring your blood glucose levels and follow the updated medication plan. If you have any concerns or experience any side effects, please contact our office.                      Contains text generated by Abridge.                                 Contains text generated by Abridge.

## 2024-10-22 DIAGNOSIS — F339 Major depressive disorder, recurrent, unspecified: Secondary | ICD-10-CM | POA: Diagnosis not present

## 2024-10-28 ENCOUNTER — Ambulatory Visit: Admitting: Pain Medicine

## 2024-10-30 DIAGNOSIS — F339 Major depressive disorder, recurrent, unspecified: Secondary | ICD-10-CM | POA: Diagnosis not present

## 2024-10-31 DIAGNOSIS — F339 Major depressive disorder, recurrent, unspecified: Secondary | ICD-10-CM | POA: Diagnosis not present

## 2024-11-05 ENCOUNTER — Ambulatory Visit: Admitting: Pain Medicine

## 2024-11-05 DIAGNOSIS — F339 Major depressive disorder, recurrent, unspecified: Secondary | ICD-10-CM | POA: Diagnosis not present

## 2024-11-07 DIAGNOSIS — F339 Major depressive disorder, recurrent, unspecified: Secondary | ICD-10-CM | POA: Diagnosis not present

## 2024-11-10 ENCOUNTER — Telehealth: Payer: Self-pay

## 2024-11-10 ENCOUNTER — Other Ambulatory Visit: Payer: Self-pay | Admitting: Internal Medicine

## 2024-11-10 DIAGNOSIS — J4530 Mild persistent asthma, uncomplicated: Secondary | ICD-10-CM

## 2024-11-10 MED ORDER — TIRZEPATIDE 7.5 MG/0.5ML ~~LOC~~ SOAJ: 7.5000 mg | SUBCUTANEOUS | 0 refills | Status: DC

## 2024-11-10 MED ORDER — DULERA 100-5 MCG/ACT IN AERO: 2.0000 | INHALATION_SPRAY | Freq: Two times a day (BID) | RESPIRATORY_TRACT | 6 refills | Status: DC

## 2024-11-10 MED ORDER — DULERA 100-5 MCG/ACT IN AERO: 2.0000 | INHALATION_SPRAY | Freq: Two times a day (BID) | RESPIRATORY_TRACT | 6 refills | Status: AC

## 2024-11-10 MED ORDER — ALBUTEROL SULFATE HFA 108 (90 BASE) MCG/ACT IN AERS: 2.0000 | INHALATION_SPRAY | Freq: Four times a day (QID) | RESPIRATORY_TRACT | 2 refills | Status: DC | PRN

## 2024-11-10 NOTE — Telephone Encounter (Signed)
 Copied from CRM #[id number]. Topic: Clinical - Prescription Issue >> Nov 10, 2024 12:45 PM Wess RAMAN wrote: Reason for CRM: Patient stated the pharmacy said she needs a new prescription for DULERA  100-5 MCG/ACT AERO and Albuterol . She also needs the tirzepatide  (MOUNJARO ) 5 MG/0.5ML Pen dosage increased  Callback #: [phone]  Pharmacy:  [facility] - [place], [place] - [address] [place] [address] Phone: [phone] Fax: [phone] Hours: Not open 24 hours

## 2024-11-11 ENCOUNTER — Other Ambulatory Visit: Payer: Self-pay | Admitting: Internal Medicine

## 2024-11-11 DIAGNOSIS — E1169 Type 2 diabetes mellitus with other specified complication: Secondary | ICD-10-CM

## 2024-11-11 NOTE — Telephone Encounter (Unsigned)
 Copied from CRM #[id number]. Topic: Clinical - Medication Refill >> Nov 11, 2024  9:07 AM Lonell PEDLAR wrote: Medication: Insulin  Glargine (BASAGLAR  KWIKPEN) 100 UNIT/ML  Has the patient contacted their pharmacy? Yes, states pt has 18 days left for next refill. Pt is almost out. Has maybe 2-3 days left.  This is the patient's preferred pharmacy:  [facility] - [place], [place] - [address] [place] [address] Phone: [phone] Fax: [phone]  Is this the correct pharmacy for this prescription? Yes If no, delete pharmacy and type the correct one.   Has the prescription been filled recently? Yes  Is the patient out of the medication? No  Has the patient been seen for an appointment in the last year OR does the patient have an upcoming appointment? Yes  Can we respond through MyChart? No  Agent: Please be advised that Rx refills may take up to 3 business days. We ask that you follow-up with your pharmacy.

## 2024-11-12 DIAGNOSIS — F339 Major depressive disorder, recurrent, unspecified: Secondary | ICD-10-CM | POA: Diagnosis not present

## 2024-11-12 NOTE — Telephone Encounter (Signed)
 Refilled 10/31/24 15 ml with 6 refills. Requested Prescriptions  Refused Prescriptions Disp Refills   Insulin  Glargine (BASAGLAR  KWIKPEN) 100 UNIT/ML 15 mL 6    Sig: Inject 31 Units into the skin daily.     Endocrinology:  Diabetes - Insulins Failed - 11/12/2024 12:20 PM      Failed - HBA1C is between 0 and 7.9 and within 180 days    HbA1c, POC (controlled diabetic range)  Date Value Ref Range Status  08/14/2024 9.1 (A) 0.0 - 7.0 % Final         Passed - Valid encounter within last 6 months    Recent Outpatient Visits           3 weeks ago Type 2 diabetes mellitus with morbid obesity (HCC)   [facility] [place]. [facility] Vicci Sober B, MD   3 months ago Type 2 diabetes mellitus with morbid obesity [facility])   [facility] [place]. [facility] Vicci Sober B, MD   8 months ago Type 2 diabetes mellitus with diabetic polyneuropathy, with long-term current use of insulin  [facility])   [facility] [place]. [facility] Vicci Sober B, MD   1 year ago Type 2 diabetes mellitus with morbid obesity [facility])   [place] [facility] [place]. [facility] Vicci Sober B, MD   1 year ago Type 2 diabetes mellitus with morbid obesity [facility])   [facility] [place]. [facility] Vicci Sober NOVAK, MD

## 2024-11-14 DIAGNOSIS — F339 Major depressive disorder, recurrent, unspecified: Secondary | ICD-10-CM | POA: Diagnosis not present

## 2024-11-17 ENCOUNTER — Other Ambulatory Visit: Payer: Self-pay | Admitting: Internal Medicine

## 2024-11-17 DIAGNOSIS — E1169 Type 2 diabetes mellitus with other specified complication: Secondary | ICD-10-CM

## 2024-11-17 DIAGNOSIS — F339 Major depressive disorder, recurrent, unspecified: Secondary | ICD-10-CM | POA: Diagnosis not present

## 2024-11-19 DIAGNOSIS — F339 Major depressive disorder, recurrent, unspecified: Secondary | ICD-10-CM | POA: Diagnosis not present

## 2024-11-24 DIAGNOSIS — F339 Major depressive disorder, recurrent, unspecified: Secondary | ICD-10-CM | POA: Diagnosis not present

## 2024-11-25 DIAGNOSIS — F339 Major depressive disorder, recurrent, unspecified: Secondary | ICD-10-CM | POA: Diagnosis not present

## 2024-11-27 DIAGNOSIS — M4802 Spinal stenosis, cervical region: Secondary | ICD-10-CM | POA: Diagnosis not present

## 2024-12-01 ENCOUNTER — Other Ambulatory Visit: Payer: Self-pay | Admitting: Neurosurgery

## 2024-12-01 ENCOUNTER — Encounter: Payer: Self-pay | Admitting: Neurosurgery

## 2024-12-01 DIAGNOSIS — M4802 Spinal stenosis, cervical region: Secondary | ICD-10-CM

## 2024-12-01 DIAGNOSIS — F339 Major depressive disorder, recurrent, unspecified: Secondary | ICD-10-CM | POA: Diagnosis not present

## 2024-12-02 ENCOUNTER — Other Ambulatory Visit: Payer: Self-pay | Admitting: Internal Medicine

## 2024-12-02 DIAGNOSIS — F339 Major depressive disorder, recurrent, unspecified: Secondary | ICD-10-CM | POA: Diagnosis not present

## 2024-12-02 DIAGNOSIS — I152 Hypertension secondary to endocrine disorders: Secondary | ICD-10-CM

## 2024-12-04 ENCOUNTER — Inpatient Hospital Stay: Admission: RE | Admit: 2024-12-04

## 2024-12-04 DIAGNOSIS — M4802 Spinal stenosis, cervical region: Secondary | ICD-10-CM | POA: Diagnosis not present

## 2024-12-04 DIAGNOSIS — M5023 Other cervical disc displacement, cervicothoracic region: Secondary | ICD-10-CM | POA: Diagnosis not present

## 2024-12-04 DIAGNOSIS — M5021 Other cervical disc displacement,  high cervical region: Secondary | ICD-10-CM | POA: Diagnosis not present

## 2024-12-04 DIAGNOSIS — M47816 Spondylosis without myelopathy or radiculopathy, lumbar region: Secondary | ICD-10-CM | POA: Diagnosis not present

## 2024-12-04 DIAGNOSIS — M50223 Other cervical disc displacement at C6-C7 level: Secondary | ICD-10-CM | POA: Diagnosis not present

## 2024-12-08 DIAGNOSIS — F339 Major depressive disorder, recurrent, unspecified: Secondary | ICD-10-CM | POA: Diagnosis not present

## 2024-12-09 DIAGNOSIS — F339 Major depressive disorder, recurrent, unspecified: Secondary | ICD-10-CM | POA: Diagnosis not present

## 2024-12-14 DIAGNOSIS — F339 Major depressive disorder, recurrent, unspecified: Secondary | ICD-10-CM | POA: Diagnosis not present

## 2024-12-15 DIAGNOSIS — F339 Major depressive disorder, recurrent, unspecified: Secondary | ICD-10-CM | POA: Diagnosis not present

## 2024-12-19 ENCOUNTER — Telehealth: Payer: Self-pay | Admitting: Internal Medicine

## 2024-12-19 MED ORDER — TIRZEPATIDE 7.5 MG/0.5ML ~~LOC~~ SOAJ: 7.5000 mg | SUBCUTANEOUS | 0 refills | Status: DC

## 2024-12-19 NOTE — Telephone Encounter (Signed)
 Copied from CRM #[id number]. Topic: Clinical - Medication Refill >> Dec 19, 2024  8:15 AM Tiffini S wrote: Medication: tirzepatide  (MOUNJARO ) 7.5 MG/0.5ML Pen- please increase to next dosage   Has the patient contacted their pharmacy? No (Agent: If no, request that the patient contact the pharmacy for the refill. If patient does not wish to contact the pharmacy document the reason why and proceed with request.) (Agent: If yes, when and what did the pharmacy advise?)  This is the patient's preferred pharmacy:  [facility] - [place], [place] - [address] [place] [address] Phone: [phone] Fax: [phone]  Is this the correct pharmacy for this prescription? Yes If no, delete pharmacy and type the correct one.   Has the prescription been filled recently? Yes  Is the patient out of the medication? Yes- was due to take injection yesterday   Has the patient been seen for an appointment in the last year OR does the patient have an upcoming appointment? Yes  Can we respond through MyChart? No, please  Agent: Please be advised that Rx refills may take up to 3 business days. We ask that you follow-up with your pharmacy.

## 2024-12-19 NOTE — Telephone Encounter (Signed)
 Requested medication (s) are due for refill today: yes  Requested medication (s) are on the active medication list: yes  Last refill:  11/10/24  Future visit scheduled: yes  Notes to clinic:   Medication not assigned to a protocol, review manually.      Requested Prescriptions  Pending Prescriptions Disp Refills   tirzepatide  (MOUNJARO ) 7.5 MG/0.5ML Pen 2 mL 0    Sig: Inject 7.5 mg into the skin once a week.     Off-Protocol Failed - 12/19/2024  2:50 PM      Failed - Medication not assigned to a protocol, review manually.      Passed - Valid encounter within last 12 months    Recent Outpatient Visits           1 month ago Type 2 diabetes mellitus with morbid obesity (HCC)   [facility] [place]. [facility] Vicci Sober B, MD   4 months ago Type 2 diabetes mellitus with morbid obesity [facility])   [facility] [place]. [facility] Vicci Sober B, MD   9 months ago Type 2 diabetes mellitus with diabetic polyneuropathy, with long-term current use of insulin  [facility])   [facility] [place]. [facility] Vicci Sober B, MD   1 year ago Type 2 diabetes mellitus with morbid obesity [facility])   [facility] [facility] [place]. [facility] Vicci Sober B, MD   2 years ago Type 2 diabetes mellitus with morbid obesity [facility])    [facility] Of . [facility] Vicci Sober NOVAK, MD

## 2024-12-19 NOTE — Telephone Encounter (Signed)
 Pt requesting- please increase to next dosage

## 2024-12-22 MED ORDER — TIRZEPATIDE 10 MG/0.5ML ~~LOC~~ SOAJ: 10.0000 mg | SUBCUTANEOUS | 0 refills | Status: AC

## 2024-12-22 NOTE — Telephone Encounter (Signed)
 Called & spoke to the patient. Verified name & DOB. Informed that the 10 mg of Mounjaro  was sent to the pharmacy today. Patient stated that she has not picked up the 7.5 mg Mounjaro  as of yet. Instructed to make sure she gets the 10 mg of Mounjaro  when she gets the pharmacy. Patient expressed verbal understanding.

## 2024-12-22 NOTE — Addendum Note (Signed)
 Addended by: VICCI SOBER B on: 12/22/2024 12:52 PM   Modules accepted: Orders

## 2024-12-22 NOTE — Telephone Encounter (Signed)
 Let pt know that the 10 mg of Mounjaro  sent today. If the 7.5 mg dose was already dispensed to her, unfortunately, she should use it first as the pharmacy will not take back medication that was already dispensed. I do apologize for this.

## 2024-12-22 NOTE — Telephone Encounter (Signed)
 Copied from CRM [id number]. Topic: Clinical - Medication Question >> Dec 22, 2024 11:07 AM Elaine Marquez wrote:  Reason for CRM: Patient stated her tirzepatide  (MOUNJARO ) 7.5 MG/0.5ML Pen got sent in, but she was expecting this refill to be the next dosage up. Please call patient if increased dose could be sent in.

## 2024-12-25 ENCOUNTER — Other Ambulatory Visit: Payer: Self-pay | Admitting: Neurosurgery

## 2024-12-30 ENCOUNTER — Other Ambulatory Visit: Payer: Self-pay | Admitting: Internal Medicine

## 2024-12-30 DIAGNOSIS — I152 Hypertension secondary to endocrine disorders: Secondary | ICD-10-CM

## 2025-01-02 NOTE — Pre-Procedure Instructions (Signed)
 Surgical Instructions   Your procedure is scheduled on Friday, January 23rd. Report to [facility] Main Entrance A at 10:10 A.M., then check in with the Admitting office. Any questions or running late day of surgery: call [phone]  Questions prior to your surgery date: call [phone], Monday-Friday, 8am-4pm. If you experience any cold or flu symptoms such as cough, fever, chills, shortness of breath, etc. between now and your scheduled surgery, please notify us  at the above number.     Remember:  Do not eat after midnight the night before your surgery  You may drink clear liquids until 09:10 AM the morning of your surgery.   Clear liquids allowed are: Water, Non-Citrus Juices (without pulp), Carbonated Beverages, Clear Tea (no milk, honey, etc.), Black Coffee Only (NO MILK, CREAM OR POWDERED CREAMER of any kind), and Gatorade.    Take these medicines the morning of surgery with A SIP OF WATER  amLODipine  (NORVASC )  DULERA   tiZANidine (ZANAFLEX)    May take these medicines IF NEEDED: albuterol  (VENTOLIN  HFA)- bring inhaler with you traMADol  (ULTRAM )   One week prior to surgery, STOP taking any Aspirin (unless otherwise instructed by your surgeon) Aleve, Naproxen, Ibuprofen , Motrin , Advil , Goody's, BC's, all herbal medications, fish oil, and non-prescription vitamins. This includes diclofenac  Sodium (VOLTAREN ) gel.  WHAT DO I DO ABOUT MY DIABETES MEDICATION?   Do not take metFORMIN  (GLUCOPHAGE -XR) the morning of surgery.  THE NIGHT BEFORE SURGERY, take 15 units (50%) of Insulin  Glargine (BASAGLAR  KWIKPEN)       STOP taking tirzepatide  (MOUNJARO ) 7 days prior to surgery. Last dose 1/12.   HOW TO MANAGE YOUR DIABETES BEFORE AND AFTER SURGERY  Why is it important to control my blood sugar before and after surgery? Improving blood sugar levels before and after surgery helps healing and can limit problems. A way of improving blood sugar control is eating a healthy diet  by:  Eating less sugar and carbohydrates  Increasing activity/exercise  Talking with your doctor about reaching your blood sugar goals High blood sugars (greater than 180 mg/dL) can raise your risk of infections and slow your recovery, so you will need to focus on controlling your diabetes during the weeks before surgery. Make sure that the doctor who takes care of your diabetes knows about your planned surgery including the date and location.  How do I manage my blood sugar before surgery? Check your blood sugar at least 4 times a day, starting 2 days before surgery, to make sure that the level is not too high or low.  Check your blood sugar the morning of your surgery when you wake up and every 2 hours until you get to the Short Stay unit.  If your blood sugar is less than 70 mg/dL, you will need to treat for low blood sugar: Do not take insulin . Treat a low blood sugar (less than 70 mg/dL) with  cup of clear juice (cranberry or apple), 4 glucose tablets, OR glucose gel. Recheck blood sugar in 15 minutes after treatment (to make sure it is greater than 70 mg/dL). If your blood sugar is not greater than 70 mg/dL on recheck, call [phone] for further instructions. Report your blood sugar to the short stay nurse when you get to Short Stay.  If you are admitted to the hospital after surgery: Your blood sugar will be checked by the staff and you will probably be given insulin  after surgery (instead of oral diabetes medicines) to make sure you have good blood sugar  levels. The goal for blood sugar control after surgery is 80-180 mg/dL.                     Do NOT Smoke (Tobacco/Vaping) for 24 hours prior to your procedure.  If you use a CPAP at night, you may bring your mask/headgear for your overnight stay.   You will be asked to remove any contacts, glasses, piercing's, hearing aid's, dentures/partials prior to surgery. Please bring cases for these items if needed.    Your surgeon will  determine if you are to be admitted or discharged the same day.  Patients discharged the day of surgery will not be allowed to drive home, and someone needs to stay with them for 24 hours.  SURGICAL WAITING ROOM VISITATION Patients may have no more than 2 support people in the waiting area - these visitors may rotate.   Pre-op nurse will coordinate an appropriate time for 2 ADULT support persons, who may not rotate, to accompany patient in pre-op.  Children under the age of 39 must have an adult with them who is not the patient and must remain in the main waiting area with an adult.  If the patient needs to stay at the hospital during part of their recovery, the visitor guidelines for inpatient rooms apply.  Please refer to the [facility] website for the visitor guidelines for any additional information.   If you received a COVID test during your pre-op visit  it is requested that you wear a mask when out in public, stay away from anyone that may not be feeling well and notify your surgeon if you develop symptoms. If you have been in contact with anyone that has tested positive in the last 10 days please notify you surgeon.      Pre-operative 4 CHG Bathing Instructions   You can play a key role in reducing the risk of infection after surgery. Your skin needs to be as free of germs as possible. You can reduce the number of germs on your skin by washing with CHG (chlorhexidine gluconate) soap before surgery. CHG is an antiseptic soap that kills germs and continues to kill germs even after washing.   DO NOT use if you have an allergy to chlorhexidine/CHG or antibacterial soaps. If your skin becomes reddened or irritated, stop using the CHG and notify one of our RNs at [phone].   Please shower with the CHG soap starting 4 days before surgery using the following schedule:     Please keep in mind the following:  DO NOT shave, including legs and underarms, starting the day of your first  shower.   You may shave your face at any point before/day of surgery.  Place clean sheets on your bed the day you start using CHG soap. Use a clean washcloth (not used since being washed) for each shower. DO NOT sleep with pets once you start using the CHG.   CHG Shower Instructions:  Wash your face and private area with normal soap. If you choose to wash your hair, wash first with your normal shampoo.  After you use shampoo/soap, rinse your hair and body thoroughly to remove shampoo/soap residue.  Turn the water OFF and apply  bottle of CHG soap to a CLEAN washcloth.  Apply CHG soap ONLY FROM YOUR NECK DOWN TO YOUR TOES (washing for 3-5 minutes)  DO NOT use CHG soap on face, private areas, open wounds, or sores.  Pay special attention to the  area where your surgery is being performed.  If you are having back surgery, having someone wash your back for you may be helpful. Wait 2 minutes after CHG soap is applied, then you may rinse off the CHG soap.  Pat dry with a clean towel  Put on clean clothes/pajamas   If you choose to wear lotion, please use ONLY the CHG-compatible lotions that are listed below.  Additional instructions for the day of surgery:  If you choose, you may shower the morning of surgery with an antibacterial soap.  DO NOT APPLY any lotions, deodorants, cologne, or perfumes.   Do not bring valuables to the hospital. [facility] is not responsible for any belongings/valuables. Do not wear nail polish, gel polish, artificial nails, or any other type of covering on natural nails (fingers and toes) Do not wear jewelry or makeup Put on clean/comfortable clothes.  Please brush your teeth.  Ask your nurse before applying any prescription medications to the skin.     CHG Compatible Lotions   Aveeno Moisturizing lotion  Cetaphil Moisturizing Cream  Cetaphil Moisturizing Lotion  Clairol Herbal Essence Moisturizing Lotion, Dry Skin  Clairol Herbal Essence Moisturizing  Lotion, Extra Dry Skin  Clairol Herbal Essence Moisturizing Lotion, Normal Skin  Curel Age Defying Therapeutic Moisturizing Lotion with Alpha Hydroxy  Curel Extreme Care Body Lotion  Curel Soothing Hands Moisturizing Hand Lotion  Curel Therapeutic Moisturizing Cream, Fragrance-Free  Curel Therapeutic Moisturizing Lotion, Fragrance-Free  Curel Therapeutic Moisturizing Lotion, Original Formula  Eucerin Daily Replenishing Lotion  Eucerin Dry Skin Therapy Plus Alpha Hydroxy Crme  Eucerin Dry Skin Therapy Plus Alpha Hydroxy Lotion  Eucerin Original Crme  Eucerin Original Lotion  Eucerin Plus Crme Eucerin Plus Lotion  Eucerin TriLipid Replenishing Lotion  Keri Anti-Bacterial Hand Lotion  Keri Deep Conditioning Original Lotion Dry Skin Formula Softly Scented  Keri Deep Conditioning Original Lotion, Fragrance Free Sensitive Skin Formula  Keri Lotion Fast Absorbing Fragrance Free Sensitive Skin Formula  Keri Lotion Fast Absorbing Softly Scented Dry Skin Formula  Keri Original Lotion  Keri Skin Renewal Lotion Keri Silky Smooth Lotion  Keri Silky Smooth Sensitive Skin Lotion  Nivea Body Creamy Conditioning Oil  Nivea Body Extra Enriched Lotion  Nivea Body Original Lotion  Nivea Body Sheer Moisturizing Lotion Nivea Crme  Nivea Skin Firming Lotion  NutraDerm 30 Skin Lotion  NutraDerm Skin Lotion  NutraDerm Therapeutic Skin Cream  NutraDerm Therapeutic Skin Lotion  ProShield Protective Hand Cream  Provon moisturizing lotion  Please read over the following fact sheets that you were given.

## 2025-01-05 ENCOUNTER — Encounter (HOSPITAL_COMMUNITY)
Admission: RE | Admit: 2025-01-05 | Discharge: 2025-01-05 | Disposition: A | Source: Ambulatory Visit | Attending: Neurosurgery

## 2025-01-05 ENCOUNTER — Other Ambulatory Visit: Payer: Self-pay

## 2025-01-05 ENCOUNTER — Encounter (HOSPITAL_COMMUNITY): Payer: Self-pay

## 2025-01-05 VITALS — BP 114/69 | HR 102 | Temp 98.2°F | Resp 18 | Ht 65.0 in | Wt 270.8 lb

## 2025-01-05 DIAGNOSIS — I10 Essential (primary) hypertension: Secondary | ICD-10-CM | POA: Diagnosis not present

## 2025-01-05 DIAGNOSIS — Z794 Long term (current) use of insulin: Secondary | ICD-10-CM | POA: Diagnosis not present

## 2025-01-05 DIAGNOSIS — Z7984 Long term (current) use of oral hypoglycemic drugs: Secondary | ICD-10-CM | POA: Diagnosis not present

## 2025-01-05 DIAGNOSIS — Z01812 Encounter for preprocedural laboratory examination: Secondary | ICD-10-CM | POA: Diagnosis present

## 2025-01-05 DIAGNOSIS — E119 Type 2 diabetes mellitus without complications: Secondary | ICD-10-CM | POA: Insufficient documentation

## 2025-01-05 DIAGNOSIS — Z7985 Long-term (current) use of injectable non-insulin antidiabetic drugs: Secondary | ICD-10-CM | POA: Insufficient documentation

## 2025-01-05 DIAGNOSIS — Z01818 Encounter for other preprocedural examination: Secondary | ICD-10-CM

## 2025-01-05 DIAGNOSIS — Z6841 Body Mass Index (BMI) 40.0 and over, adult: Secondary | ICD-10-CM | POA: Insufficient documentation

## 2025-01-05 DIAGNOSIS — J45909 Unspecified asthma, uncomplicated: Secondary | ICD-10-CM | POA: Diagnosis not present

## 2025-01-05 DIAGNOSIS — M4802 Spinal stenosis, cervical region: Secondary | ICD-10-CM | POA: Insufficient documentation

## 2025-01-05 DIAGNOSIS — Z01810 Encounter for preprocedural cardiovascular examination: Secondary | ICD-10-CM | POA: Diagnosis present

## 2025-01-05 HISTORY — DX: Unspecified osteoarthritis, unspecified site: M19.90

## 2025-01-05 LAB — BASIC METABOLIC PANEL WITH GFR
Anion gap: 11 (ref 5–15)
BUN: 9 mg/dL (ref 6–20)
CO2: 25 mmol/L (ref 22–32)
Calcium: 10 mg/dL (ref 8.9–10.3)
Chloride: 98 mmol/L (ref 98–111)
Creatinine, Ser: 0.6 mg/dL (ref 0.44–1.00)
GFR, Estimated: >60 mL/min
Glucose, Bld: 117 mg/dL — ABNORMAL HIGH (ref 70–99)
Potassium: 4.7 mmol/L (ref 3.5–5.1)
Sodium: 134 mmol/L — ABNORMAL LOW (ref 135–145)

## 2025-01-05 LAB — HEMOGLOBIN A1C
Hgb A1c MFr Bld: 7.6 % — ABNORMAL HIGH (ref 4.8–5.6)
Mean Plasma Glucose: 171.42 mg/dL

## 2025-01-05 LAB — CBC
HCT: 42.3 % (ref 36.0–46.0)
Hemoglobin: 14.5 g/dL (ref 12.0–15.0)
MCH: 29.7 pg (ref 26.0–34.0)
MCHC: 34.3 g/dL (ref 30.0–36.0)
MCV: 86.5 fL (ref 80.0–100.0)
Platelets: 420 K/uL — ABNORMAL HIGH (ref 150–400)
RBC: 4.89 MIL/uL (ref 3.87–5.11)
RDW: 14.5 % (ref 11.5–15.5)
WBC: 10.4 K/uL (ref 4.0–10.5)
nRBC: 0 % (ref 0.0–0.2)

## 2025-01-05 LAB — SURGICAL PCR SCREEN
MRSA, PCR: NEGATIVE
Staphylococcus aureus: POSITIVE — AB

## 2025-01-05 LAB — GLUCOSE, CAPILLARY: Glucose-Capillary: 129 mg/dL — ABNORMAL HIGH (ref 70–99)

## 2025-01-05 LAB — TYPE AND SCREEN
ABO/RH(D): A POS
Antibody Screen: NEGATIVE

## 2025-01-05 NOTE — Progress Notes (Signed)
 PCP - Vicci Barnie NOVAK, MD  Cardiologist -   PPM/ICD - denies Device Orders - n/a Rep Notified - n/a  Chest x-ray - denies EKG - 01-05-25 Stress Test - denies ECHO - denies Cardiac Cath - denies  Sleep Study - denies CPAP - n/a  Fasting Blood Sugar - Per patient blood sugar ranges 88-140. Blood sugar at PAT appointment 129 Checks Blood Sugar - patient has a freestyle Libre left arm  Last dose of GLP1 agonist-  tirzepatide  (MOUNJARO ) Last dose 01-04-2025 GLP1 instructions: patient aware to hold medication on Sunday  Blood Thinner Instructions:denies Aspirin Instructions:denies  ERAS Protcol -clear liquids until 9:10   COVID TEST- n/a   Anesthesia review: yes  Patient denies shortness of breath, fever, cough and chest pain at PAT appointment   All instructions explained to the patient, with a verbal understanding of the material. Patient agrees to go over the instructions while at home for a better understanding. Patient also instructed to self quarantine after being tested for COVID-19. The opportunity to ask questions was provided.

## 2025-01-06 NOTE — Progress Notes (Signed)
 Anesthesia Chart Review:  Case: [id number] Date/Time: 01/13/25 1156   Procedure: ANTERIOR CERVICAL DECOMPRESSION/DISCECTOMY FUSION 3 LEVELS - ACDF - C3-C4 - C4-C5 - C5-C6   Anesthesia type: General   Diagnosis: Cervical spinal stenosis [M48.02]   Pre-op diagnosis: Cervical spinal stenosis   [facility]: [facility] OR ROOM 19 / [facility] OR   Surgeons: Louis Shove, MD       DISCUSSION: Patient is a 54 year old female scheduled for the above procedure.  History includes never smoker, HTN, DM2, asthma, arthritis, tubal ligation (03/23/2008).  BMI is consistent with morbid obesity.  A1c 7.6%.  She is on insulin  glargine (100 unit/mL) 31 units nightly, metformin  1000 mg twice daily, Mounjaro  weekly.  Last Mounjaro  01/04/2025 and advised at PAT to hold until after surgery.  She has a Freestyle Libre CGM to her LUE.  Anesthesia team to evaluate on the day of surgery.  VS: BP 114/69   Pulse (!) 102   Temp 36.8 C   Resp 18   Ht 5' 5 (1.651 m)   Wt 122.8 kg   SpO2 97%   BMI 45.06 kg/m   PROVIDERS: Vicci Barnie NOVAK, MD is PCP    LABS: Labs reviewed: Acceptable for surgery. (all labs ordered are listed, but only abnormal results are displayed)  Labs Reviewed  SURGICAL PCR SCREEN - Abnormal; Notable for the following components:      Result Value   Staphylococcus aureus POSITIVE (*)    All other components within normal limits  GLUCOSE, CAPILLARY - Abnormal; Notable for the following components:   Glucose-Capillary 129 (*)    All other components within normal limits  HEMOGLOBIN A1C - Abnormal; Notable for the following components:   Hgb A1c MFr Bld 7.6 (*)    All other components within normal limits  BASIC METABOLIC PANEL WITH GFR - Abnormal; Notable for the following components:   Sodium 134 (*)    Glucose, Bld 117 (*)    All other components within normal limits  CBC - Abnormal; Notable for the following components:   Platelets 420 (*)    All other components within normal limits  TYPE AND  SCREEN     IMAGES: MRI C-spine 12/04/2024: IMPRESSION: 1. Multilevel cervical spondylosis with resultant mild to moderate diffuse spinal stenosis at C3-4 through C7-T1. 2. Multifactorial degenerative changes with resultant multilevel foraminal narrowing as above. Notable findings include moderate left C4 foraminal stenosis, with mild left C3 and C7 foraminal narrowing.    EKG: 01/05/2025: Normal sinus rhythm   CV: N/A  Past Medical History:  Diagnosis Date   Arthritis    Asthma    Diabetes mellitus without complication (HCC)    Hypertension    Peritonsillar abscess     Past Surgical History:  Procedure Laterality Date   TUBAL LIGATION      MEDICATIONS:  ACCU-CHEK GUIDE TEST test strip   Accu-Chek Softclix Lancets lancets   albuterol  (VENTOLIN  HFA) 108 (90 Base) MCG/ACT inhaler   amLODipine  (NORVASC ) 10 MG tablet   Blood Glucose Monitoring Suppl (ACCU-CHEK GUIDE) w/Device KIT   Continuous Glucose Receiver (FREESTYLE LIBRE 3 READER) DEVI   Continuous Glucose Sensor (FREESTYLE LIBRE 3 SENSOR) MISC   diclofenac  Sodium (VOLTAREN ) 1 % GEL   DULERA  100-5 MCG/ACT AERO   gabapentin  (NEURONTIN ) 300 MG capsule   Insulin  Glargine (BASAGLAR  KWIKPEN) 100 UNIT/ML   Insulin  Pen Needle (LITETOUCH PEN NEEDLES) 31G X 6 MM MISC   Lancets Misc. (ACCU-CHEK FASTCLIX LANCET) KIT   losartan  (COZAAR ) 25 MG tablet  metFORMIN  (GLUCOPHAGE -XR) 500 MG 24 hr tablet   tirzepatide  (MOUNJARO ) 10 MG/0.5ML Pen   tiZANidine (ZANAFLEX) 4 MG tablet   traMADol  (ULTRAM ) 50 MG tablet   No current facility-administered medications for this encounter.    Isaiah Ruder, PA-C Surgical Short Stay/Anesthesiology [facility] Phone [phone] [facility] Phone [phone] 01/06/2025 4:43 PM

## 2025-01-06 NOTE — Anesthesia Preprocedure Evaluation (Addendum)
"                                    Anesthesia Evaluation  Patient identified by MRN, date of birth, ID band Patient awake    Reviewed: Allergy & Precautions, NPO status , Patient's Chart, lab work & pertinent test results  History of Anesthesia Complications Negative for: history of anesthetic complications  Airway Mallampati: III  TM Distance: >3 FB Neck ROM: Full    Dental  (+) Partial Lower, Partial Upper   Pulmonary asthma , neg sleep apnea, neg COPD, Patient abstained from smoking.Not current smoker Well controlled asthma on daily inhalers.   Pulmonary exam normal breath sounds clear to auscultation       Cardiovascular Exercise Tolerance: Good METShypertension, Pt. on medications (-) CAD and (-) Past MI (-) dysrhythmias  Rhythm:Regular Rate:Normal - Systolic murmurs    Neuro/Psych  Neuromuscular disease  negative psych ROS   GI/Hepatic ,neg GERD  ,,(+)     (-) substance abuse    Endo/Other  diabetes, Well Controlled, Type 2, Insulin  Dependent  Class 3 obesityGlp1ra last taken > 7 days ago, denies GI symptoms today  Renal/GU negative Renal ROS     Musculoskeletal  (+) Arthritis ,    Abdominal  (+) + obese  Peds  Hematology   Anesthesia Other Findings Past Medical History: No date: Arthritis No date: Asthma No date: Diabetes mellitus without complication (HCC) No date: Hypertension No date: Peritonsillar abscess  Reproductive/Obstetrics                              Anesthesia Physical Anesthesia Plan  ASA: 3  Anesthesia Plan: General   Post-op Pain Management: Tylenol  PO (pre-op)* and Dilaudid  IV   Induction: Intravenous  PONV Risk Score and Plan: 4 or greater and Ondansetron , Dexamethasone , Midazolam  and Treatment may vary due to age or medical condition  Airway Management Planned: Oral ETT and Video Laryngoscope Planned  Additional Equipment: None  Intra-op Plan:   Post-operative Plan: Extubation  in OR  Informed Consent: I have reviewed the patients History and Physical, chart, labs and discussed the procedure including the risks, benefits and alternatives for the proposed anesthesia with the patient or authorized representative who has indicated his/her understanding and acceptance.     Dental advisory given  Plan Discussed with: CRNA and Surgeon  Anesthesia Plan Comments: (Discussed risks of anesthesia with patient, including PONV, sore throat, lip/dental/eye damage. Rare risks discussed as well, such as cardiorespiratory and neurological sequelae, and allergic reactions. Discussed the role of CRNA in patient's perioperative care. Patient understands. Patient informed about increased incidence of above perioperative risk due to high BMI. Patient understands.  )         Anesthesia Quick Evaluation  "

## 2025-01-08 ENCOUNTER — Other Ambulatory Visit: Payer: Self-pay | Admitting: Internal Medicine

## 2025-01-08 NOTE — Progress Notes (Signed)
 Spoke with patient reviewed instructions for upcoming surgery. Patient voiced understanding. Last dose of tirzepatide  (MOUNJARO ) 01-04-25.

## 2025-01-13 ENCOUNTER — Inpatient Hospital Stay (HOSPITAL_COMMUNITY): Payer: Self-pay | Admitting: Vascular Surgery

## 2025-01-13 ENCOUNTER — Inpatient Hospital Stay (HOSPITAL_COMMUNITY)

## 2025-01-13 ENCOUNTER — Inpatient Hospital Stay (HOSPITAL_COMMUNITY): Payer: Self-pay | Admitting: Anesthesiology

## 2025-01-13 ENCOUNTER — Inpatient Hospital Stay (HOSPITAL_COMMUNITY)
Admission: RE | Admit: 2025-01-13 | Discharge: 2025-01-14 | DRG: 472 | Disposition: A | Discharge status: Home / Self Care | Attending: Neurosurgery | Admitting: Neurosurgery

## 2025-01-13 ENCOUNTER — Encounter (HOSPITAL_COMMUNITY): Admission: RE | Payer: Self-pay | Source: Home / Self Care

## 2025-01-13 DIAGNOSIS — Z794 Long term (current) use of insulin: Secondary | ICD-10-CM | POA: Diagnosis not present

## 2025-01-13 DIAGNOSIS — J45909 Unspecified asthma, uncomplicated: Secondary | ICD-10-CM

## 2025-01-13 DIAGNOSIS — M4712 Other spondylosis with myelopathy, cervical region: Secondary | ICD-10-CM | POA: Diagnosis present

## 2025-01-13 DIAGNOSIS — M4802 Spinal stenosis, cervical region: Secondary | ICD-10-CM

## 2025-01-13 DIAGNOSIS — E119 Type 2 diabetes mellitus without complications: Secondary | ICD-10-CM | POA: Diagnosis present

## 2025-01-13 DIAGNOSIS — I10 Essential (primary) hypertension: Secondary | ICD-10-CM | POA: Diagnosis present

## 2025-01-13 DIAGNOSIS — Z8249 Family history of ischemic heart disease and other diseases of the circulatory system: Secondary | ICD-10-CM | POA: Diagnosis not present

## 2025-01-13 DIAGNOSIS — Z886 Allergy status to analgesic agent status: Secondary | ICD-10-CM

## 2025-01-13 DIAGNOSIS — Z7951 Long term (current) use of inhaled steroids: Secondary | ICD-10-CM

## 2025-01-13 DIAGNOSIS — M4722 Other spondylosis with radiculopathy, cervical region: Secondary | ICD-10-CM | POA: Diagnosis present

## 2025-01-13 DIAGNOSIS — Z7984 Long term (current) use of oral hypoglycemic drugs: Secondary | ICD-10-CM | POA: Diagnosis not present

## 2025-01-13 DIAGNOSIS — Z823 Family history of stroke: Secondary | ICD-10-CM | POA: Diagnosis not present

## 2025-01-13 DIAGNOSIS — Z79899 Other long term (current) drug therapy: Secondary | ICD-10-CM | POA: Diagnosis not present

## 2025-01-13 DIAGNOSIS — Z7985 Long-term (current) use of injectable non-insulin antidiabetic drugs: Secondary | ICD-10-CM | POA: Diagnosis not present

## 2025-01-13 DIAGNOSIS — Z833 Family history of diabetes mellitus: Secondary | ICD-10-CM

## 2025-01-13 LAB — GLUCOSE, CAPILLARY
Glucose-Capillary: 123 mg/dL — ABNORMAL HIGH (ref 70–99)
Glucose-Capillary: 119 mg/dL — ABNORMAL HIGH (ref 70–99)
Glucose-Capillary: 154 mg/dL — ABNORMAL HIGH (ref 70–99)
Glucose-Capillary: 187 mg/dL — ABNORMAL HIGH (ref 70–99)
Glucose-Capillary: 250 mg/dL — ABNORMAL HIGH (ref 70–99)

## 2025-01-13 LAB — ABO/RH: ABO/RH(D): A POS

## 2025-01-13 MED ORDER — INSULIN ASPART 100 UNIT/ML IJ SOLN
0.0000 [IU] | INTRAMUSCULAR | Status: DC | PRN
  Administered 2025-01-13: 2 [IU] via SUBCUTANEOUS

## 2025-01-13 MED ORDER — LACTATED RINGERS IV SOLN: INTRAVENOUS | Status: DC | PRN

## 2025-01-13 MED ORDER — FENTANYL CITRATE (PF) 100 MCG/2ML IJ SOLN
INTRAMUSCULAR | Status: AC
  Filled 2025-01-13: qty 2

## 2025-01-13 MED ORDER — ALBUTEROL SULFATE HFA 108 (90 BASE) MCG/ACT IN AERS
INHALATION_SPRAY | RESPIRATORY_TRACT | Status: DC | PRN
  Administered 2025-01-13 (×3): 2 via RESPIRATORY_TRACT

## 2025-01-13 MED ORDER — PROPOFOL 10 MG/ML IV BOLUS
INTRAVENOUS | Status: AC
  Filled 2025-01-13: qty 20

## 2025-01-13 MED ORDER — PHENYLEPHRINE 80 MCG/ML (10ML) SYRINGE FOR IV PUSH (FOR BLOOD PRESSURE SUPPORT)
PREFILLED_SYRINGE | INTRAVENOUS | Status: DC | PRN
  Administered 2025-01-13: 160 ug via INTRAVENOUS

## 2025-01-13 MED ORDER — ROCURONIUM BROMIDE 10 MG/ML (PF) SYRINGE
PREFILLED_SYRINGE | INTRAVENOUS | Status: AC
  Filled 2025-01-13: qty 20

## 2025-01-13 MED ORDER — HYDROMORPHONE HCL 1 MG/ML IJ SOLN
1.0000 mg | INTRAMUSCULAR | Status: DC | PRN
  Administered 2025-01-13: 1 mg via INTRAVENOUS
  Filled 2025-01-13: qty 1

## 2025-01-13 MED ORDER — METFORMIN HCL ER 500 MG PO TB24
1000.0000 mg | ORAL_TABLET | Freq: Two times a day (BID) | ORAL | Status: DC
  Filled 2025-01-13: qty 2

## 2025-01-13 MED ORDER — LACTATED RINGERS IV SOLN: INTRAVENOUS | Status: DC

## 2025-01-13 MED ORDER — INSULIN ASPART 100 UNIT/ML IJ SOLN: 0.0000 [IU] | Freq: Three times a day (TID) | INTRAMUSCULAR | Status: DC

## 2025-01-13 MED ORDER — CHLORHEXIDINE GLUCONATE 4 % EX SOLN: 1.0000 | CUTANEOUS | 1 refills | Status: AC

## 2025-01-13 MED ORDER — ACETAMINOPHEN 500 MG PO TABS
1000.0000 mg | ORAL_TABLET | Freq: Once | ORAL | Status: AC
  Administered 2025-01-13: 1000 mg via ORAL
  Filled 2025-01-13: qty 2

## 2025-01-13 MED ORDER — HYDROMORPHONE HCL 1 MG/ML IJ SOLN
INTRAMUSCULAR | Status: AC
  Filled 2025-01-13: qty 0.5

## 2025-01-13 MED ORDER — HYDROMORPHONE HCL 1 MG/ML IJ SOLN
INTRAMUSCULAR | Status: DC | PRN
  Administered 2025-01-13 (×2): .5 mg via INTRAVENOUS

## 2025-01-13 MED ORDER — EPHEDRINE 5 MG/ML INJ
INTRAVENOUS | Status: AC
  Filled 2025-01-13: qty 10

## 2025-01-13 MED ORDER — CHLORHEXIDINE GLUCONATE CLOTH 2 % EX PADS: 6.0000 | MEDICATED_PAD | Freq: Once | CUTANEOUS | Status: DC

## 2025-01-13 MED ORDER — METHOCARBAMOL 500 MG PO TABS
500.0000 mg | ORAL_TABLET | Freq: Four times a day (QID) | ORAL | Status: DC | PRN
  Administered 2025-01-13 – 2025-01-14 (×3): 500 mg via ORAL
  Filled 2025-01-13 (×4): qty 1

## 2025-01-13 MED ORDER — KETAMINE HCL 50 MG/5ML IJ SOSY
PREFILLED_SYRINGE | INTRAMUSCULAR | Status: DC | PRN
  Administered 2025-01-13: 50 mg via INTRAVENOUS

## 2025-01-13 MED ORDER — MENTHOL 3 MG MT LOZG: 1.0000 | LOZENGE | OROMUCOSAL | Status: DC | PRN

## 2025-01-13 MED ORDER — ACETAMINOPHEN 650 MG RE SUPP: 650.0000 mg | RECTAL | Status: DC | PRN

## 2025-01-13 MED ORDER — ROCURONIUM BROMIDE 10 MG/ML (PF) SYRINGE
PREFILLED_SYRINGE | INTRAVENOUS | Status: DC | PRN
  Administered 2025-01-13 (×2): 20 mg via INTRAVENOUS
  Administered 2025-01-13: 60 mg via INTRAVENOUS
  Administered 2025-01-13: 20 mg via INTRAVENOUS
  Administered 2025-01-13: 30 mg via INTRAVENOUS
  Administered 2025-01-13 (×2): 20 mg via INTRAVENOUS

## 2025-01-13 MED ORDER — DEXMEDETOMIDINE HCL IN NACL 80 MCG/20ML IV SOLN
INTRAVENOUS | Status: DC | PRN
  Administered 2025-01-13 (×2): 4 ug via INTRAVENOUS
  Administered 2025-01-13 (×2): 8 ug via INTRAVENOUS

## 2025-01-13 MED ORDER — METHOCARBAMOL 1000 MG/10ML IJ SOLN: 500.0000 mg | Freq: Four times a day (QID) | INTRAMUSCULAR | Status: DC | PRN

## 2025-01-13 MED ORDER — CHLORHEXIDINE GLUCONATE 0.12 % MT SOLN
15.0000 mL | Freq: Once | OROMUCOSAL | Status: AC
  Administered 2025-01-13: 15 mL via OROMUCOSAL
  Filled 2025-01-13: qty 15

## 2025-01-13 MED ORDER — ONDANSETRON HCL 4 MG PO TABS: 4.0000 mg | ORAL_TABLET | Freq: Four times a day (QID) | ORAL | Status: DC | PRN

## 2025-01-13 MED ORDER — CHLORHEXIDINE GLUCONATE CLOTH 2 % EX PADS
6.0000 | MEDICATED_PAD | Freq: Once | CUTANEOUS | Status: AC
  Administered 2025-01-13: 6 via TOPICAL

## 2025-01-13 MED ORDER — HYDROCODONE-ACETAMINOPHEN 5-325 MG PO TABS: 1.0000 | ORAL_TABLET | ORAL | Status: DC | PRN

## 2025-01-13 MED ORDER — DIPHENHYDRAMINE HCL 50 MG/ML IJ SOLN
INTRAMUSCULAR | Status: DC | PRN
  Administered 2025-01-13: 12.5 mg via INTRAVENOUS

## 2025-01-13 MED ORDER — ACETAMINOPHEN 325 MG PO TABS: 650.0000 mg | ORAL_TABLET | ORAL | Status: DC | PRN

## 2025-01-13 MED ORDER — HYDROCODONE-ACETAMINOPHEN 10-325 MG PO TABS
1.0000 | ORAL_TABLET | ORAL | Status: DC | PRN
  Administered 2025-01-13: 1 via ORAL
  Administered 2025-01-14 (×3): 2 via ORAL
  Filled 2025-01-13 (×3): qty 2
  Filled 2025-01-13: qty 1

## 2025-01-13 MED ORDER — LOSARTAN POTASSIUM 25 MG PO TABS
25.0000 mg | ORAL_TABLET | Freq: Every day | ORAL | Status: DC
  Administered 2025-01-13 – 2025-01-14 (×2): 25 mg via ORAL
  Filled 2025-01-13 (×3): qty 1

## 2025-01-13 MED ORDER — 0.9 % SODIUM CHLORIDE (POUR BTL) OPTIME
TOPICAL | Status: DC | PRN
  Administered 2025-01-13: 1000 mL

## 2025-01-13 MED ORDER — CEFAZOLIN SODIUM-DEXTROSE 1-4 GM/50ML-% IV SOLN
1.0000 g | Freq: Three times a day (TID) | INTRAVENOUS | Status: AC
  Administered 2025-01-13 – 2025-01-14 (×2): 1 g via INTRAVENOUS
  Filled 2025-01-13 (×2): qty 50

## 2025-01-13 MED ORDER — ORAL CARE MOUTH RINSE: 15.0000 mL | Freq: Once | OROMUCOSAL | Status: AC

## 2025-01-13 MED ORDER — ALBUTEROL SULFATE (2.5 MG/3ML) 0.083% IN NEBU: 3.0000 mL | INHALATION_SOLUTION | Freq: Four times a day (QID) | RESPIRATORY_TRACT | Status: DC | PRN

## 2025-01-13 MED ORDER — THROMBIN 20000 UNITS EX SOLR
CUTANEOUS | Status: AC
  Filled 2025-01-13: qty 20000

## 2025-01-13 MED ORDER — SODIUM CHLORIDE 0.9 % IV SOLN
0.1500 ug/kg/min | INTRAVENOUS | Status: AC
  Administered 2025-01-13: .05 ug/kg/min via INTRAVENOUS
  Filled 2025-01-13: qty 2000

## 2025-01-13 MED ORDER — PROPOFOL 10 MG/ML IV BOLUS
INTRAVENOUS | Status: DC | PRN
  Administered 2025-01-13: 180 mg via INTRAVENOUS
  Administered 2025-01-13: 30 mg via INTRAVENOUS
  Administered 2025-01-13: 70 ug/kg/min via INTRAVENOUS

## 2025-01-13 MED ORDER — ROCURONIUM BROMIDE 10 MG/ML (PF) SYRINGE
PREFILLED_SYRINGE | INTRAVENOUS | Status: AC
  Filled 2025-01-13: qty 10

## 2025-01-13 MED ORDER — MIDAZOLAM HCL 2 MG/2ML IJ SOLN
INTRAMUSCULAR | Status: AC
  Filled 2025-01-13: qty 2

## 2025-01-13 MED ORDER — SODIUM CHLORIDE 0.9% FLUSH: 3.0000 mL | INTRAVENOUS | Status: DC | PRN

## 2025-01-13 MED ORDER — PHENOL 1.4 % MT LIQD: 1.0000 | OROMUCOSAL | Status: DC | PRN

## 2025-01-13 MED ORDER — SODIUM CHLORIDE 0.9% FLUSH
3.0000 mL | Freq: Two times a day (BID) | INTRAVENOUS | Status: DC
  Administered 2025-01-13: 3 mL via INTRAVENOUS

## 2025-01-13 MED ORDER — OXYCODONE HCL 5 MG PO TABS
ORAL_TABLET | ORAL | Status: AC
  Filled 2025-01-13: qty 1

## 2025-01-13 MED ORDER — MUPIROCIN 2 % EX OINT: 1.0000 | TOPICAL_OINTMENT | Freq: Two times a day (BID) | CUTANEOUS | 0 refills | Status: AC

## 2025-01-13 MED ORDER — OXYCODONE HCL 5 MG/5ML PO SOLN: 5.0000 mg | Freq: Once | ORAL | Status: AC | PRN

## 2025-01-13 MED ORDER — THROMBIN 20000 UNITS EX SOLR
CUTANEOUS | Status: DC | PRN
  Administered 2025-01-13: 20 mL via TOPICAL

## 2025-01-13 MED ORDER — PHENYLEPHRINE HCL-NACL 20-0.9 MG/250ML-% IV SOLN
INTRAVENOUS | Status: DC | PRN
  Administered 2025-01-13: 30 ug/min via INTRAVENOUS

## 2025-01-13 MED ORDER — CEFAZOLIN SODIUM-DEXTROSE 3-4 GM/150ML-% IV SOLN
3.0000 g | INTRAVENOUS | Status: AC
  Administered 2025-01-13 (×2): 3 g via INTRAVENOUS
  Filled 2025-01-13: qty 150

## 2025-01-13 MED ORDER — ONDANSETRON HCL 4 MG/2ML IJ SOLN: 4.0000 mg | Freq: Four times a day (QID) | INTRAMUSCULAR | Status: DC | PRN

## 2025-01-13 MED ORDER — KETAMINE HCL 50 MG/5ML IJ SOSY
PREFILLED_SYRINGE | INTRAMUSCULAR | Status: AC
  Filled 2025-01-13: qty 5

## 2025-01-13 MED ORDER — BASAGLAR KWIKPEN 100 UNIT/ML ~~LOC~~ SOPN: 31.0000 [IU] | PEN_INJECTOR | Freq: Every day | SUBCUTANEOUS | Status: DC

## 2025-01-13 MED ORDER — METFORMIN HCL ER 500 MG PO TB24
1000.0000 mg | ORAL_TABLET | Freq: Two times a day (BID) | ORAL | Status: DC
  Administered 2025-01-13 – 2025-01-14 (×2): 1000 mg via ORAL
  Filled 2025-01-13: qty 2

## 2025-01-13 MED ORDER — CEFAZOLIN SODIUM 1 G IJ SOLR
INTRAMUSCULAR | Status: AC
  Filled 2025-01-13: qty 30

## 2025-01-13 MED ORDER — DROPERIDOL 2.5 MG/ML IJ SOLN: 0.6250 mg | Freq: Once | INTRAMUSCULAR | Status: DC | PRN

## 2025-01-13 MED ORDER — GABAPENTIN 300 MG PO CAPS
300.0000 mg | ORAL_CAPSULE | Freq: Every day | ORAL | Status: DC
  Administered 2025-01-13: 300 mg via ORAL
  Filled 2025-01-13: qty 1

## 2025-01-13 MED ORDER — FENTANYL CITRATE (PF) 100 MCG/2ML IJ SOLN
INTRAMUSCULAR | Status: DC | PRN
  Administered 2025-01-13: 100 ug via INTRAVENOUS

## 2025-01-13 MED ORDER — THROMBIN 5000 UNITS EX KIT
PACK | CUTANEOUS | Status: AC
  Filled 2025-01-13: qty 1

## 2025-01-13 MED ORDER — OXYCODONE HCL 5 MG PO TABS
5.0000 mg | ORAL_TABLET | Freq: Once | ORAL | Status: AC | PRN
  Administered 2025-01-13: 5 mg via ORAL

## 2025-01-13 MED ORDER — SUGAMMADEX SODIUM 200 MG/2ML IV SOLN
INTRAVENOUS | Status: DC | PRN
  Administered 2025-01-13: 400 mg via INTRAVENOUS

## 2025-01-13 MED ORDER — LIDOCAINE 2% (20 MG/ML) 5 ML SYRINGE
INTRAMUSCULAR | Status: DC | PRN
  Administered 2025-01-13: 100 mg via INTRAVENOUS

## 2025-01-13 MED ORDER — ALBUTEROL SULFATE HFA 108 (90 BASE) MCG/ACT IN AERS
INHALATION_SPRAY | RESPIRATORY_TRACT | Status: AC
  Filled 2025-01-13: qty 6.7

## 2025-01-13 MED ORDER — ONDANSETRON HCL 4 MG/2ML IJ SOLN
INTRAMUSCULAR | Status: DC | PRN
  Administered 2025-01-13: 4 mg via INTRAVENOUS

## 2025-01-13 MED ORDER — INSULIN GLARGINE-YFGN 100 UNIT/ML ~~LOC~~ SOLN
31.0000 [IU] | Freq: Every day | SUBCUTANEOUS | Status: DC
  Administered 2025-01-13: 31 [IU] via SUBCUTANEOUS
  Filled 2025-01-13 (×2): qty 0.31

## 2025-01-13 MED ORDER — MIDAZOLAM HCL (PF) 2 MG/2ML IJ SOLN
INTRAMUSCULAR | Status: DC | PRN
  Administered 2025-01-13: 2 mg via INTRAVENOUS

## 2025-01-13 MED ORDER — THROMBIN 5000 UNITS EX SOLR
OROMUCOSAL | Status: DC | PRN
  Administered 2025-01-13: 5 mL via TOPICAL

## 2025-01-13 MED ORDER — FENTANYL CITRATE (PF) 100 MCG/2ML IJ SOLN
25.0000 ug | INTRAMUSCULAR | Status: DC | PRN
  Administered 2025-01-13 (×4): 50 ug via INTRAVENOUS

## 2025-01-13 MED ORDER — FLUTICASONE FUROATE-VILANTEROL 100-25 MCG/ACT IN AEPB
1.0000 | INHALATION_SPRAY | Freq: Every day | RESPIRATORY_TRACT | Status: DC
  Filled 2025-01-13: qty 28

## 2025-01-13 MED ORDER — DEXAMETHASONE SOD PHOSPHATE PF 10 MG/ML IJ SOLN
INTRAMUSCULAR | Status: DC | PRN
  Administered 2025-01-13: 10 mg via INTRAVENOUS

## 2025-01-13 MED ORDER — PHENYLEPHRINE 80 MCG/ML (10ML) SYRINGE FOR IV PUSH (FOR BLOOD PRESSURE SUPPORT)
PREFILLED_SYRINGE | INTRAVENOUS | Status: AC
  Filled 2025-01-13: qty 20

## 2025-01-13 MED ORDER — AMLODIPINE BESYLATE 10 MG PO TABS
10.0000 mg | ORAL_TABLET | Freq: Every day | ORAL | Status: DC
  Administered 2025-01-14: 10 mg via ORAL
  Filled 2025-01-13 (×2): qty 1

## 2025-01-13 MED ORDER — LIDOCAINE 2% (20 MG/ML) 5 ML SYRINGE
INTRAMUSCULAR | Status: AC
  Filled 2025-01-13: qty 10

## 2025-01-13 MED ORDER — SODIUM CHLORIDE 0.9 % IV SOLN: 250.0000 mL | INTRAVENOUS | Status: DC

## 2025-01-13 NOTE — H&P (Signed)
 " Elaine Marquez is an 54 y.o. female.   Chief Complaint: Neck pain HPI: 54 year old female with severe posterior cervical pain with radiating pain and numbness in both upper extremities failing conservative management workup demonstrates evidence of marked cervical spondylosis with associated stenosis and moderate cord compression with some cord signal abnormality at C3-4.  Patient presents now for C3-4, C4-5 and C5-6 anterior cervical discectomy and fusion with interbody cages and anterior plate instrumentation.  Past Medical History:  Diagnosis Date   Arthritis    Asthma    Diabetes mellitus without complication (HCC)    Hypertension    Peritonsillar abscess     Past Surgical History:  Procedure Laterality Date   TUBAL LIGATION      Family History  Problem Relation Age of Onset   Hypertension Mother    Diabetes Mother    Hypertension Father    Diabetes Father    CVA Brother    Social History:  reports that she has never smoked. She has never used smokeless tobacco. She reports current alcohol use. She reports that she does not use drugs.  Allergies: Allergies[1]  Medications Prior to Admission  Medication Sig Dispense Refill   amLODipine  (NORVASC ) 10 MG tablet Take 1 tablet (10 mg total) by mouth daily. 90 tablet 1   diclofenac  Sodium (VOLTAREN ) 1 % GEL APPLY TWO grams topically FOUR TIMES DAILY (Patient taking differently: Apply 2 g topically 4 (four) times daily as needed (pain).) 100 g 0   DULERA  100-5 MCG/ACT AERO Inhale 2 puffs into the lungs 2 (two) times daily. 13 g 6   gabapentin  (NEURONTIN ) 300 MG capsule TAKE ONE CAPSULE (300mg  total) BY MOUTH AT BEDTIME 30 capsule 5   Insulin  Glargine (BASAGLAR  KWIKPEN) 100 UNIT/ML Inject 31 Units into the skin daily. (Patient taking differently: Inject 31 Units into the skin at bedtime.) 15 mL 6   losartan  (COZAAR ) 25 MG tablet TAKE ONE TABLET (25mg  total) BY MOUTH EVERY DAY 90 tablet 1   metFORMIN  (GLUCOPHAGE -XR) 500 MG 24 hr  tablet Take 2 tablets (1,000 mg total) by mouth 2 (two) times daily with a meal. 360 tablet 1   tirzepatide  (MOUNJARO ) 10 MG/0.5ML Pen Inject 10 mg into the skin once a week. Once she completes the 7.5 mg dose. (Patient taking differently: Inject 10 mg into the skin every Monday.) 2 mL 0   tiZANidine (ZANAFLEX) 4 MG tablet Take 4 mg by mouth every 8 (eight) hours.     traMADol  (ULTRAM ) 50 MG tablet Take 50 mg by mouth every 6 (six) hours as needed (pain).     ACCU-CHEK GUIDE TEST test strip USE TO check blood sugar three times daily 100 strip 11   Accu-Chek Softclix Lancets lancets USE TO CHECK BLOOD SUGAR THREE TIMES DAILY AS DIRECTED 100 each 0   albuterol  (VENTOLIN  HFA) 108 (90 Base) MCG/ACT inhaler Inhale 2 puffs into the lungs every 6 (six) hours as needed for wheezing or shortness of breath. 18 g 2   Blood Glucose Monitoring Suppl (ACCU-CHEK GUIDE) w/Device KIT Check blood sugars 3 times a day before meals. 1 kit 0   Continuous Glucose Receiver (FREESTYLE LIBRE 3 READER) DEVI Use to check glucose continuously. 1 each 0   Continuous Glucose Sensor (FREESTYLE LIBRE 3 SENSOR) MISC Place 1 sensor on the skin every 14 days. Use to check glucose continuously 2 each 6   Insulin  Pen Needle (LITETOUCH PEN NEEDLES) 31G X 6 MM MISC USE TO administer insulin  EVERY DAY 100  each 0   Lancets Misc. (ACCU-CHEK FASTCLIX LANCET) KIT Check blood glucose at least 3 times daily. 1 kit 6    Results for orders placed or performed during the hospital encounter of 01/13/25 (from the past 48 hours)  Glucose, capillary     Status: Abnormal   Collection Time: 01/13/25 10:25 AM  Result Value Ref Range   Glucose-Capillary 123 (H) 70 - 99 mg/dL    Comment: Glucose reference range applies only to samples taken after fasting for at least 8 hours.   Comment 1 Notify RN    No results found.  Pertinent items noted in HPI and remainder of comprehensive ROS otherwise negative.  Blood pressure 128/83, pulse 96, temperature  98.2 F (36.8 C), temperature source Oral, resp. rate 18, height 5' 5 (1.651 m), weight 121.6 kg, SpO2 95%.  Patient is awake and alert.  She is oriented and appropriate.  Speech is fluent.  Judgment insight are intact.  Cranial nerve function normal bilaterally.  Motor examination reveals some mild weakness of grip and intrinsic strength otherwise motor strength intact.  Sensory examination reveals decrease sensation in her upper extremities distally bilaterally.  Deep tendon rhexis are normal active.  No with long track signs.  Gait and posture reasonably normal.  Examination head ears eyes nose throat is unremarkable chest and abdomen are benign.  Extremities are free of major deformity. 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  Plan C3-4, C4-5, C5-6 anterior cervical discectomy with interbody fusion utilizing interbody cages, local harvested autograft, and anterior plate as per patient.  Risks and benefits been explained.  Patient wishes proceed.  Elaine Marquez 01/13/2025, 10:37 AM       [1]  Allergies Allergen Reactions   Ibuprofen  Nausea And Vomiting   "

## 2025-01-13 NOTE — Anesthesia Procedure Notes (Signed)
 Procedure Name: Intubation Date/Time: 01/13/2025 11:43 AM  Performed by: Mollie Olivia SAUNDERS, CRNAPre-anesthesia Checklist: Patient identified, Emergency Drugs available, Suction available and Patient being monitored Patient Re-evaluated:Patient Re-evaluated prior to induction Oxygen Delivery Method: Circle system utilized Preoxygenation: Pre-oxygenation with 100% oxygen Induction Type: IV induction Ventilation: Mask ventilation without difficulty Laryngoscope Size: Glidescope and 3 Grade View: Grade III Tube type: Oral Tube size: 7.0 mm Number of attempts: 1 Airway Equipment and Method: Rigid stylet and Video-laryngoscopy Placement Confirmation: ETT inserted through vocal cords under direct vision, positive ETCO2 and breath sounds checked- equal and bilateral Secured at: 23 cm Tube secured with: Tape Dental Injury: Teeth and Oropharynx as per pre-operative assessment  Difficulty Due To: Difficulty was anticipated, Difficult Airway- due to immobile epiglottis, Difficult Airway- due to anterior larynx, Difficult Airway- due to large tongue and Difficult Airway- due to reduced neck mobility

## 2025-01-13 NOTE — Brief Op Note (Signed)
 01/13/2025 12:11 PM  3:35 PM  PATIENT:  Elaine Marquez  54 y.o. female  PRE-OPERATIVE DIAGNOSIS:  Cervical spinal stenosis  POST-OPERATIVE DIAGNOSIS:  Cervical spinal stenosis  PROCEDURE:  Procedures: ANTERIOR CERVICAL DECOMPRESSION/DISCECTOMY FUSION CERVICAL THREE-FOUR,CERVICAL FOUR-FIVE,CERVICAL FIVE-SIX (N/A)  SURGEON:  Surgeons and Role:    DEWAINE Louis Shove, MD - Primary  PHYSICIAN ASSISTANT:   ASSISTANTSBETHA Jennetta PIETY   ANESTHESIA:   general  EBL:  100 mL   BLOOD ADMINISTERED:none  DRAINS: none   LOCAL MEDICATIONS USED:  NONE  SPECIMEN:  No Specimen  DISPOSITION OF SPECIMEN:  N/A  COUNTS:  YES  TOURNIQUET:  * No tourniquets in log *  DICTATION: .Dragon Dictation  PLAN OF CARE: Admit to inpatient   PATIENT DISPOSITION:  PACU - hemodynamically stable.   Delay start of Pharmacological VTE agent (>24hrs) due to surgical blood loss or risk of bleeding: yes

## 2025-01-13 NOTE — Transfer of Care (Signed)
 Immediate Anesthesia Transfer of Care Note  Patient: Elaine Marquez  Procedure(s) Performed: ANTERIOR CERVICAL DECOMPRESSION/DISCECTOMY FUSION CERVICAL THREE-FOUR,CERVICAL FOUR-FIVE,CERVICAL FIVE-SIX  Patient Location: PACU  Anesthesia Type:General  Level of Consciousness: awake, alert , drowsy, and patient cooperative  Airway & Oxygen Therapy: Patient Spontanous Breathing and Patient connected to face mask oxygen  Post-op Assessment: Report given to RN, Post -op Vital signs reviewed and stable, and Patient moving all extremities X 4  Post vital signs: Reviewed and stable  Last Vitals:  Vitals Value Taken Time  BP 144/92 01/13/25 16:00  Temp    Pulse 104 01/13/25 16:02  Resp 25 01/13/25 16:02  SpO2 99 % 01/13/25 16:02  Vitals shown include unfiled device data.  Last Pain:  Vitals:   01/13/25 1119  TempSrc:   PainSc: 8       Patients Stated Pain Goal: 3 (01/13/25 1119)  Complications: No notable events documented.

## 2025-01-13 NOTE — Op Note (Signed)
 Date of procedure: 01/13/2025  Date of dictation: Same  Service: Neurosurgery  Preoperative diagnosis: Cervical stenosis with myelopathy  Postoperative diagnosis: Same  Procedure Name: C3-4, C4-5, C5-6 anterior cervical discectomy and interbody fusion utilizing interbody cages, local harvested autograft, and anterior plate instrumentation  Surgeon:Sabella Traore A.Lonzell Dorris, M.D.  Asst. Surgeon: Jennetta, NP  Anesthesia: General  Indication: 54 year old female with neck and bilateral upper extremity pain numbness and some weakness.  Workup demonstrates evidence of marked cervical spondyloarthropathy with calcified disc protrusions and significant spinal stenosis with ongoing cord compression and some cord signal abnormality at C3-4.  Patient presents now for multilevel anterior cervical decompression and fusion in hopes improving her symptoms.  Operative note: After induction of anesthesia, patient positioned supine with neck slightly extended and held placeholder traction.  Patient's anterior cervical region prepped draped sterilely.  Incision made overlying C4-5.  Dissection performed of the right.  Retractor placed.  Fluoroscopy used.  Levels confirmed.  Anterior osteophytes were removed overlying C3-4, C4-5 and C5-6.  Disc spaces or identified and incised.  Disc material was removed using various instruments down to level the posterior annulus.  Microscope then brought into the field used throughout the remainder of the discectomy.  Remaining aspects of annulus and osteophytes removed using high-speed drill down to level the posterior logical limb.  Posterior electrode was was elevated and resected in a piecemeal fashion.  A wide central decompression was then performed undercutting the bodies of C3 and C4.  Decompression then proceeded into each neural foramina.  Wide anterior foraminotomies performed on the course exiting C4 nerve roots bilaterally.  At this point a very thorough decompression had been  achieved.  There is no evidence of injury to the thecal sac or nerve roots.  Procedures and repeated at C4-5 and C5-6.  Findings were similar at both levels.  Decompression was achieved without complication at both levels.  Wound was then irrigated.  Gelfoam was placed topically for hemostasis and removed.  [employer] anatomic peek cage was then packed with locally harvested autograft.  Cage was then impacted in place at all 3 levels.  Each cage was recessed slightly from the anterior cortical margin.  Atlantis translational plate was then placed over the C3, C4 and C5 levels.  This attached under fluoroscopic guidance using 13 mm screws.  All screws given final tightening found to be solidly from bone.  Locking screws engaged in all levels.  Final images reveal good position of the cages and the hardware at the proper level with normal alignment of spine.  Wound was then inspected for hemostasis was found to be good.  Irrigated 1 final time and then closed in typical fashion.  Steri-Strips and sterile dressing were applied.  No apparent complications.  Patient tolerated the procedure well and she returns to the recovery room postop.

## 2025-01-13 NOTE — Anesthesia Postprocedure Evaluation (Signed)
"   Anesthesia Post Note  Patient: Elaine Marquez  Procedure(s) Performed: ANTERIOR CERVICAL DECOMPRESSION/DISCECTOMY FUSION CERVICAL THREE-FOUR,CERVICAL FOUR-FIVE,CERVICAL FIVE-SIX     Patient location during evaluation: PACU Anesthesia Type: General Level of consciousness: awake and alert Pain management: pain level controlled Vital Signs Assessment: post-procedure vital signs reviewed and stable Respiratory status: spontaneous breathing, nonlabored ventilation, respiratory function stable and patient connected to nasal cannula oxygen Cardiovascular status: blood pressure returned to baseline and stable Postop Assessment: no apparent nausea or vomiting Anesthetic complications: no   No notable events documented.  Last Vitals:  Vitals:   01/13/25 1630 01/13/25 1645  BP: 130/83 132/87  Pulse: (!) 108 (!) 104  Resp: (!) 24 18  Temp:    SpO2: 97% 93%    Last Pain:  Vitals:   01/13/25 1645  TempSrc:   PainSc: Asleep   Pain Goal: Patients Stated Pain Goal: 3 (01/13/25 1119)    LLE Sensation: Full sensation (01/13/25 1630)   RLE Sensation: Full sensation (01/13/25 1630)        Rome Ade      "

## 2025-01-13 NOTE — Progress Notes (Signed)
 Orthopedic Tech Progress Note Patient Details:  Elaine Marquez [birth year] [id number]  Patient has collar   Patient ID: Elaine Marquez, female   DOB: [birth year], 54 y.o.   MRN: [id number]  [name] 01/13/2025, 6:49 PM

## 2025-01-14 LAB — GLUCOSE, CAPILLARY: Glucose-Capillary: 138 mg/dL — ABNORMAL HIGH (ref 70–99)

## 2025-01-14 MED ORDER — METHOCARBAMOL 500 MG PO TABS: 500.0000 mg | ORAL_TABLET | Freq: Four times a day (QID) | ORAL | 1 refills | Status: AC | PRN

## 2025-01-14 MED ORDER — INSULIN ASPART 100 UNIT/ML IJ SOLN: 0.0000 [IU] | Freq: Three times a day (TID) | INTRAMUSCULAR | Status: DC

## 2025-01-14 MED ORDER — OXYCODONE-ACETAMINOPHEN 10-325 MG PO TABS: 1.0000 | ORAL_TABLET | ORAL | 0 refills | Status: AC | PRN

## 2025-01-14 NOTE — Discharge Summary (Signed)
 Physician Discharge Summary  Patient ID: Elaine Marquez MRN: [id number] DOB/AGE: 55-Dec-1972 55 y.o.  Admit date: 01/13/2025 Discharge date: 01/14/2025  Admission Diagnoses:  Discharge Diagnoses:  Principal Problem:   Cervical spondylosis with myelopathy and radiculopathy   Discharged Condition: good  Hospital Course: Patient mid to the hospital where she underwent uncomplicated three-level anterior cervical decompression and fusion.  Postoperatively doing reasonably well.  Neck pain under good control.  Upper extremity symptoms some what improved.  Swallowing well.  Standing ambulating voiding.difficulty.  Okay for discharge home.  Consults:  Significant Diagnostic Studies:   Treatments:   Discharge Exam: Blood pressure (!) 152/97, pulse (!) 110, temperature 99.5 F (37.5 C), temperature source Oral, resp. rate 18, height 5' 5 (1.651 m), weight 121.6 kg, SpO2 94%. Awake and alert.  Oriented and appropriate.  Motor examination intact bilateral.  Sensory examination still with some distal numbness in both hands.  Wound clean and dry.  Neck soft.  Chest and abdomen benign.  Disposition: Discharge disposition: 01-Home or Self Care        Allergies as of 01/14/2025       Reactions   Ibuprofen  Nausea And Vomiting        Medication List     TAKE these medications    Accu-Chek FastClix Lancet Kit Check blood glucose at least 3 times daily.   Accu-Chek Guide Test test strip Generic drug: glucose blood USE TO check blood sugar three times daily   Accu-Chek Guide w/Device Kit Check blood sugars 3 times a day before meals.   Accu-Chek Softclix Lancets lancets USE TO CHECK BLOOD SUGAR THREE TIMES DAILY AS DIRECTED   albuterol  108 (90 Base) MCG/ACT inhaler Commonly known as: VENTOLIN  HFA Inhale 2 puffs into the lungs every 6 (six) hours as needed for wheezing or shortness of breath.   amLODipine  10 MG tablet Commonly known as: NORVASC  Take 1 tablet (10 mg  total) by mouth daily.   Basaglar  KwikPen 100 UNIT/ML Inject 31 Units into the skin daily. What changed: when to take this   chlorhexidine  4 % external liquid Commonly known as: HIBICLENS  Apply 15 mLs (1 Application total) topically as directed for 30 doses. Use as directed daily for 5 days every other week for 6 weeks.   diclofenac  Sodium 1 % Gel Commonly known as: VOLTAREN  APPLY TWO grams topically FOUR TIMES DAILY What changed: See the new instructions.   Dulera  100-5 MCG/ACT Aero Generic drug: mometasone -formoterol  Inhale 2 puffs into the lungs 2 (two) times daily.   FreeStyle Libre 3 Reader [place] Use to check glucose continuously.   FreeStyle Libre 3 Sensor Misc Place 1 sensor on the skin every 14 days. Use to check glucose continuously   gabapentin  300 MG capsule Commonly known as: NEURONTIN  TAKE ONE CAPSULE (300mg  total) BY MOUTH AT BEDTIME   HYDROcodone -acetaminophen  5-325 MG tablet Commonly known as: NORCO/VICODIN Take 1 tablet by mouth every 6 (six) hours as needed for moderate pain (pain score 4-6).   Litetouch Pen Needles 31G X 6 MM Misc Generic drug: Insulin  Pen Needle USE TO administer insulin  EVERY DAY   losartan  25 MG tablet Commonly known as: COZAAR  TAKE ONE TABLET (25mg  total) BY MOUTH EVERY DAY   metFORMIN  500 MG 24 hr tablet Commonly known as: GLUCOPHAGE -XR Take 2 tablets (1,000 mg total) by mouth 2 (two) times daily with a meal.   methocarbamol  500 MG tablet Commonly known as: ROBAXIN  Take 1 tablet (500 mg total) by mouth every 6 (six) hours as  needed for muscle spasms.   mupirocin  ointment 2 % Commonly known as: BACTROBAN  Place 1 Application into the nose 2 (two) times daily for 60 doses. Use as directed 2 times daily for 5 days every other week for 6 weeks.   oxyCODONE -acetaminophen  10-325 MG tablet Commonly known as: Percocet Take 1 tablet by mouth every 4 (four) hours as needed for pain.   tirzepatide  10 MG/0.5ML Pen Commonly known as:  MOUNJARO  Inject 10 mg into the skin once a week. Once she completes the 7.5 mg dose. What changed:  when to take this additional instructions   tiZANidine 4 MG tablet Commonly known as: ZANAFLEX Take 4 mg by mouth every 8 (eight) hours.   traMADol  50 MG tablet Commonly known as: ULTRAM  Take 50 mg by mouth every 6 (six) hours as needed (pain).         Signed: [employer] Sojourner Behringer 01/14/2025, 9:17 AM

## 2025-01-14 NOTE — Plan of Care (Signed)
 Pt doing well. Pt given D/C instructions with verbal understanding. Rx's were sent to the pharmacy by MD. Pt's incision is clean and dry with no sign of infection. Pt's IV was removed prior to D/C. Pt D/C'd home via wheelchair per MD order. Pt is stable @ D/C and has no other needs at this time. Rema Fendt, RN

## 2025-01-14 NOTE — Evaluation (Signed)
 Occupational Therapy Evaluation Patient Details Name: Elaine Marquez MRN: [id number] DOB: 1971/11/30 Today's Date: 01/14/2025   History of Present Illness   54 yo F adm 1/27 ACDF C3-4 C4-5 C5-6 PMH arthritis, asthma, HTN, DM, peritonsillar abscess     Clinical Impressions Patient evaluated by Occupational Therapy with no further acute OT needs identified. All education has been completed and the patient has no further questions. See below for any follow-up Occupational Therapy or equipment needs. OT to sign off. Thank you for referral.       If plan is discharge home, recommend the following:         Functional Status Assessment   Patient has had a recent decline in their functional status and demonstrates the ability to make significant improvements in function in a reasonable and predictable amount of time.     Equipment Recommendations   None recommended by OT     Recommendations for Other Services         Precautions/Restrictions   Precautions Precautions: Cervical Recall of Precautions/Restrictions: Intact Required Braces or Orthoses: Cervical Brace Cervical Brace: Soft collar;For comfort Restrictions Weight Bearing Restrictions Per Provider Order: No     Mobility Bed Mobility Overal bed mobility: Modified Independent                  Transfers Overall transfer level: Modified independent                        Balance Overall balance assessment: Modified Independent                                         ADL either performed or assessed with clinical judgement   ADL Overall ADL's : Modified independent                                       General ADL Comments: dressing in session, sink level grooming, don doff brace and stair transfer Cervical precautions ( handout provided): Educated patient on don doff brace with return demonstration, educated on oral care using cups, washing face  with cloth, never to wash directly on incision site, avoid neck rotation flexion and extension, positioning with pillows in chair for bil UE, sleeping positioning, avoiding pushing / pulling with bil UE, and fine motor exercises ( handout provided). I  Pt educated on need to notify doctor / RN of swallowing changes or choking..       Vision Baseline Vision/History: 1 Wears glasses Ability to See in Adequate Light: 0 Adequate Patient Visual Report: No change from baseline       Perception         Praxis         Pertinent Vitals/Pain Pain Assessment Pain Assessment: No/denies pain     Extremity/Trunk Assessment Upper Extremity Assessment Upper Extremity Assessment: Right hand dominant;LUE deficits/detail LUE Deficits / Details: noted to have numbness and tingling 1st 2nd and 3rd digit. pt reports 3rd digit very painful since surgery and this is new. provided handout for fine motor LUE Sensation: decreased light touch LUE Coordination: decreased fine motor   Lower Extremity Assessment Lower Extremity Assessment: Overall WFL for tasks assessed   Cervical / Trunk Assessment Cervical / Trunk Assessment: Neck Surgery   Communication Communication Communication: No apparent  difficulties   Cognition Arousal: Alert Behavior During Therapy: WFL for tasks assessed/performed Cognition: No apparent impairments                               Following commands: Intact       Cueing  General Comments      incision dry and open to air   Exercises     Shoulder Instructions      Home Living Family/patient expects to be discharged to:: Private residence Living Arrangements: Spouse/significant other;Children Available Help at Discharge: Family;Available 24 hours/day Type of Home: House Home Access: Stairs to enter [employer] of Steps: 7 Entrance Stairs-Rails: Can reach both Home Layout: One level     Bathroom Shower/Tub: Scientist, Research (life Sciences): Standard     Home Equipment: None          Prior Functioning/Environment Prior Level of Function : Independent/Modified Independent                    OT Problem List:     OT Treatment/Interventions:        OT Goals(Current goals can be found in the care plan section)   Acute Rehab OT Goals Potential to Achieve Goals: Good   OT Frequency:       Co-evaluation              AM-PAC OT 6 Clicks Daily Activity     Outcome Measure Help from another person eating meals?: None Help from another person taking care of personal grooming?: None Help from another person toileting, which includes using toliet, bedpan, or urinal?: None Help from another person bathing (including washing, rinsing, drying)?: None Help from another person to put on and taking off regular upper body clothing?: None Help from another person to put on and taking off regular lower body clothing?: None 6 Click Score: 24   End of Session Equipment Utilized During Treatment: Cervical collar Nurse Communication: Mobility status;Precautions  Activity Tolerance: Patient tolerated treatment well Patient left: in bed;with call bell/phone within reach  OT Visit Diagnosis: Unsteadiness on feet (R26.81)                Time: 9166-9099 OT Time Calculation (min): 27 min Charges:  OT General Charges $OT Visit: 1 Visit OT Evaluation $OT Eval Moderate Complexity: 1 Mod OT Treatments $Self Care/Home Management : 8-22 mins   Elaine Marquez, OTR/L  Acute Rehabilitation Services Office: [phone] .   Ely Molt 01/14/2025, 10:19 AM

## 2025-01-14 NOTE — Discharge Instructions (Signed)

## 2025-01-14 NOTE — Care Management (Signed)
 Patient with order to DC to home today. Unit staff to provide DME needed for home.   No HH needs identified Patient will have family/ friends provide transportation home. No other TOC needs identified for DC

## 2025-01-15 ENCOUNTER — Telehealth: Payer: Self-pay | Admitting: *Deleted

## 2025-01-15 ENCOUNTER — Other Ambulatory Visit: Payer: Self-pay | Admitting: Internal Medicine

## 2025-01-15 NOTE — Transitions of Care (Post Inpatient/ED Visit) (Signed)
" ° °  01/15/2025  Name: Elaine Marquez MRN: [id number] DOB: March 20, 1971  Today's TOC FU Call Status: Today's TOC FU Call Status:: Unsuccessful Call (1st Attempt) Unsuccessful Call (1st Attempt) Date: 01/15/25  Attempted to reach the patient regarding the most recent Inpatient/ED visit.  Follow Up Plan: Additional outreach attempts will be made to reach the patient to complete the Transitions of Care (Post Inpatient/ED visit) call.   Andrea Dimes RN, BSN [place]  [facility] RN Care Manager [phone]  "

## 2025-01-16 ENCOUNTER — Telehealth: Payer: Self-pay | Admitting: *Deleted

## 2025-01-16 ENCOUNTER — Encounter (HOSPITAL_COMMUNITY): Payer: Self-pay | Admitting: Neurosurgery

## 2025-01-16 NOTE — Transitions of Care (Post Inpatient/ED Visit) (Signed)
 "  01/16/2025  Name: Elaine Marquez MRN: [id number] DOB: November 14, 1971  Today's TOC FU Call Status: Today's TOC FU Call Status:: Successful TOC FU Call Completed TOC FU Call Complete Date: 01/16/25  Patient's Name and Date of Birth confirmed. Name, DOB  Transition Care Management Follow-up Telephone Call Date of Discharge: 01/14/25 Discharge Facility: [facility]) Type of Discharge: Inpatient Admission Primary Inpatient Discharge Diagnosis:: Cervical spondylosis with myelopathy and radiculopathy How have you been since you were released from the hospital?: Better Any questions or concerns?: No  Items Reviewed: Did you receive and understand the discharge instructions provided?: Yes Dietary orders reviewed?: Yes Type of Diet Ordered:: carb modified Do you have support at home?: Yes People in Home [RPT]: spouse Name of Support/Comfort Primary Source: spouse/Lloyd  Medications Reviewed Today: Medications Reviewed Today     Reviewed by Lucky Andrea LABOR, RN (Registered Nurse) on 01/16/25 at 1457  Med List Status: <None>   Medication Order Taking? Sig Documenting Provider Last Dose Status Informant  ACCU-CHEK GUIDE TEST test strip [id number]  USE TO check blood sugar three times daily  Patient not taking: Reported on 01/16/2025   Vicci Barnie NOVAK, MD  Active Self  Accu-Chek Softclix Lancets lancets [id number]  USE TO CHECK BLOOD SUGAR THREE TIMES DAILY AS DIRECTED  Patient not taking: Reported on 01/16/2025   Vicci Barnie NOVAK, MD  Active Self  albuterol  (VENTOLIN  HFA) 108 (90 Base) MCG/ACT inhaler [id number] Yes Inhale 2 puffs into the lungs every 6 (six) hours as needed for wheezing or shortness of breath. Vicci Barnie NOVAK, MD  Active   amLODipine  (NORVASC ) 10 MG tablet [id number] Yes Take 1 tablet (10 mg total) by mouth daily. Vicci Barnie NOVAK, MD  Active Self  Blood Glucose Monitoring Suppl (ACCU-CHEK GUIDE) w/Device KIT [id number]  Check blood sugars 3 times a day before  meals.  Patient not taking: Reported on 01/16/2025   Vicci Barnie NOVAK, MD  Active Self  chlorhexidine  (HIBICLENS ) 4 % external liquid [id number] Yes Apply 15 mLs (1 Application total) topically as directed for 30 doses. Use as directed daily for 5 days every other week for 6 weeks. Louis Shove, MD  Active   Continuous Glucose Receiver (FREESTYLE LIBRE 3 READER) DEVI [id number] Yes Use to check glucose continuously. Vicci Barnie NOVAK, MD  Active Self  Continuous Glucose Sensor (FREESTYLE LIBRE 3 [place]) [place] [id number] Yes Place 1 sensor on the skin every 14 days. Use to check glucose continuously Vicci Barnie NOVAK, MD  Active Self  diclofenac  Sodium (VOLTAREN ) 1 % GEL [id number] Yes APPLY TWO grams topically FOUR TIMES DAILY  Patient taking differently: Apply 2 g topically 4 (four) times daily as needed (pain).   Vicci Barnie NOVAK, MD  Active Self  DULERA  100-5 MCG/ACT TERESE [id number] Yes Inhale 2 puffs into the lungs 2 (two) times daily. Vicci Barnie NOVAK, MD  Active Self  gabapentin  (NEURONTIN ) 300 MG capsule [id number] Yes TAKE ONE CAPSULE (300mg  total) BY MOUTH AT BEDTIME Vicci Barnie NOVAK, MD  Active Self  HYDROcodone -acetaminophen  (NORCO/VICODIN) 5-325 MG tablet [id number]  Take 1 tablet by mouth every 6 (six) hours as needed for moderate pain (pain score 4-6).  Patient not taking: Reported on 01/16/2025   [provider]  Active   Insulin  Glargine (BASAGLAR  KWIKPEN) 100 UNIT/ML [id number] Yes Inject 31 Units into the skin daily. Vicci Barnie NOVAK, MD  Active Self  Insulin  Pen Needle (LITETOUCH PEN NEEDLES) 31G X 6 MM MISC [id number] Yes USE TO administer insulin  EVERY  MICHAE Vicci Barnie KATHEE, MD  Active Self  Lancets Misc. (ACCU-CHEK FASTCLIX LANCET) KIT [id number]  Check blood glucose at least 3 times daily.  Patient not taking: Reported on 01/16/2025   Alec House, MD  Active Self  losartan  (COZAAR ) 25 MG tablet [id number] Yes TAKE ONE TABLET (25mg  total) BY MOUTH EVERY DAY Vicci Barnie KATHEE, MD  Active Self  metFORMIN  (GLUCOPHAGE -XR) 500 MG 24 hr tablet [id number] Yes Take 2 tablets (1,000 mg total) by mouth 2 (two) times daily with a meal. Vicci Barnie KATHEE, MD  Active Self  methocarbamol  (ROBAXIN ) 500 MG tablet [id number] Yes Take 1 tablet (500 mg total) by mouth every 6 (six) hours as needed for muscle spasms. Louis Shove, MD  Active   mupirocin  ointment (BACTROBAN ) 2 % [id number] Yes Place 1 Application into the nose 2 (two) times daily for 60 doses. Use as directed 2 times daily for 5 days every other week for 6 weeks. Louis Shove, MD  Active   oxyCODONE -acetaminophen  (PERCOCET) 10-325 MG tablet [id number] Yes Take 1 tablet by mouth every 4 (four) hours as needed for pain. Louis Shove, MD  Active   tirzepatide  (MOUNJARO ) 10 MG/0.5ML Pen [id number] Yes Inject 10 mg into the skin once a week. Once she completes the 7.5 mg dose.  Patient taking differently: Inject 10 mg into the skin every Monday.   Vicci Barnie KATHEE, MD  Active Self  tiZANidine (ZANAFLEX) 4 MG tablet [id number]  Take 4 mg by mouth every 8 (eight) hours.  Patient not taking: Reported on 01/16/2025   [provider]  Active Self  traMADol  (ULTRAM ) 50 MG tablet [id number]  Take 50 mg by mouth every 6 (six) hours as needed (pain).  Patient not taking: Reported on 01/16/2025   [provider]  Active Self  Med List Note Broadus Reda CROME, RN 05/26/24 1526): 05-26-24 UDS            Home Care and Equipment/Supplies: Were Home Health Services Ordered?: No Any new equipment or medical supplies ordered?: No  Functional Questionnaire: Do you need assistance with bathing/showering or dressing?: Yes (husband assists) Do you need assistance with meal preparation?: Yes (family) Do you need assistance with eating?: No Do you have difficulty maintaining continence: No Do you need assistance with getting out of bed/getting out of a chair/moving?: No Do you have difficulty managing or taking your  medications?: No  Follow up appointments reviewed: PCP Follow-up appointment confirmed?: No (Assisted with scheduling at [facility] on 02/06/25. Patient preferred to attend at this office.) MD Provider Line Number:[phone] Given: No Specialist Hospital Follow-up appointment confirmed?: Yes Date of Specialist follow-up appointment?: 01/21/25 Follow-Up Specialty Provider:: Dr. Malcolm Do you need transportation to your follow-up appointment?: No Do you understand care options if your condition(s) worsen?: Yes-patient verbalized understanding  SDOH Interventions Today    Flowsheet Row Most Recent Value  SDOH Interventions   Food Insecurity Interventions Intervention Not Indicated  Housing Interventions Intervention Not Indicated  Transportation Interventions Intervention Not Indicated  Utilities Interventions Intervention Not Indicated    Goals Addressed             This Visit's Progress    V[facility]I Transitions of Care (TOC) Care Plan       Problems:  Recent Hospitalization for treatment of Cervical spondylosis post cervical decompression and fusion No Hospital Follow Up Provider appointment    Goal:  Over the next 30 days, the patient will not experience hospital readmission  Interventions:  Transitions of Care:  Doctor Visits  - discussed the importance of doctor visits Post discharge activity limitations prescribed by provider reviewed Post-op wound/incision care reviewed with patient/caregiver Reviewed Signs and symptoms of infection Diet reviewed, encouraged increasing protein to help with BS management and healing Assisted with scheduling PCP hospital follow up on 02/06/25-patient preferred   Patient Self Care Activities:  Attend all scheduled provider appointments Call pharmacy for medication refills 3-7 days in advance of running out of medications Call provider office for new concerns or questions  Notify RN Care Manager of TOC call rescheduling needs Participate in  Transition of Care Program/Attend TOC scheduled calls Take medications as prescribed    Plan:  Telephone follow up appointment with care management team member scheduled for:  01/22/25 at 9am       Discussed and offered 30 day TOC program.  Patient      enrolled .  The patient has been provided with contact information for the care management team and has been advised to call with any health -related questions or concerns.  The patient verbalized understanding with current plan of care.  The patient is directed to their insurance card regarding availability of benefits coverage.   Andrea Dimes RN, BSN [place]  Value-Based Care Institute [facility] RN Care Manager [phone]  "

## 2025-01-22 ENCOUNTER — Other Ambulatory Visit: Payer: Self-pay | Admitting: *Deleted

## 2025-01-22 NOTE — Patient Instructions (Signed)
 Visit Information  Thank you for taking time to visit with me today. Please don't hesitate to contact me if I can be of assistance to you before our next scheduled telephone appointment.   Following is a copy of your care plan:   Goals Addressed             This Visit's Progress    VBCI Transitions of Care (TOC) Care Plan       Problems:  Recent Hospitalization for treatment of Cervical spondylosis post cervical decompression and fusion No Hospital Follow Up Provider appointment    Goal:  Over the next 30 days, the patient will not experience hospital readmission  Interventions:  Transitions of Care: Doctor Visits  - discussed the importance of doctor visits Post discharge activity limitations prescribed by provider reviewed Post-op wound/incision care reviewed with patient/caregiver Diet reviewed, encouraged increasing protein to help with BS management and healing Reviewed post op visit was rescheduled to 01/27/25 Medication review, discussed pain regimen  Patient Self Care Activities:  Attend all scheduled provider appointments Call pharmacy for medication refills 3-7 days in advance of running out of medications Call provider office for new concerns or questions  Notify RN Care Manager of [facility] call rescheduling needs Participate in Transition of Care Program/Attend TOC scheduled calls Take medications as prescribed    Plan:  Telephone follow up appointment with care management team member scheduled for:  01/29/25 at 10am        Patient verbalizes understanding of instructions and care plan provided today and agrees to view in MyChart. Active MyChart status and patient understanding of how to access instructions and care plan via MyChart confirmed with patient.     Telephone follow up appointment with care management team member scheduled for:01/29/25 at 10am  Please call the care guide team at [phone] if you need to cancel or reschedule your appointment.    Please call [phone]-TALK (toll free, 24 hour hotline) go to [facility] Urgent [facility] [address], [place] [phone]) call 911 if you are experiencing a Mental Health or Behavioral Health Crisis or need someone to talk to.  Andrea Dimes RN, BSN [place]  Value-Based Care Institute [facility] RN Care Manager [phone]

## 2025-01-22 NOTE — Transitions of Care (Post Inpatient/ED Visit) (Signed)
 " Transition of Care week 2  Visit Note  01/22/2025  Name: Elaine Marquez MRN: [id number]          DOB: 12/21/1970  Situation: Patient enrolled in [facility] 30-day program. Visit completed with [name] by telephone.   Background:   Initial Transition Care Management Follow-up Telephone Call Discharge Date and Diagnosis: 01/14/25, Cervical spondylosis with myelopathy and radiculopathy   Past Medical History:  Diagnosis Date   Arthritis    Asthma    Diabetes mellitus without complication (HCC)    Hypertension    Peritonsillar abscess     Assessment: Patient Reported Symptoms: Cognitive Cognitive Status: Able to follow simple commands, Alert and oriented to person, place, and time, Normal speech and language skills      Neurological Neurological Review of Symptoms: Numbness Neurological Management Strategies: Adequate rest, Coping strategies Neurological Self-Management Outcome: 4 (good) Neurological Comment: Finger numbness continues to improve, one finger now  HEENT HEENT Symptoms Reported: No symptoms reported HEENT Self-Management Outcome: 4 (good)    Cardiovascular Cardiovascular Symptoms Reported: No symptoms reported    Respiratory Respiratory Symptoms Reported: No symptoms reported    Endocrine Endocrine Symptoms Reported: Not assessed    Gastrointestinal Gastrointestinal Symptoms Reported: No symptoms reported Additional Gastrointestinal Details: LBM 01/21/25      Genitourinary Genitourinary Symptoms Reported: No symptoms reported    Integumentary   Skin Management Strategies: Coping strategies, Dressing changes, Routine screening, Adequate rest Skin Self-Management Outcome: 4 (good)  Musculoskeletal Musculoskelatal Symptoms Reviewed: No symptoms reported   Falls in the past year?: No    Psychosocial Psychosocial Symptoms Reported: No symptoms reported         There were no vitals filed for this visit. Pain Score: 8  Pain Type: Surgical pain Pain  Location: Incision Pain Orientation: Anterior Pain Descriptors / Indicators: Sore  Medications Reviewed Today     Reviewed by Lucky Andrea LABOR, RN (Registered Nurse) on 01/22/25 at [phone]  Med List Status: <None>   Medication Order Taking? Sig Documenting Provider Last Dose Status Informant  ACCU-CHEK GUIDE TEST test strip [id number]  USE TO check blood sugar three times daily  Patient not taking: Reported on 01/22/2025   Vicci Barnie NOVAK, MD  Active Self  Accu-Chek Softclix Lancets lancets [id number]  USE TO CHECK BLOOD SUGAR THREE TIMES DAILY AS DIRECTED  Patient not taking: Reported on 01/22/2025   Vicci Barnie NOVAK, MD  Active Self  albuterol  (VENTOLIN  HFA) 108 (90 Base) MCG/ACT inhaler [id number] Yes Inhale 2 puffs into the lungs every 6 (six) hours as needed for wheezing or shortness of breath. Vicci Barnie NOVAK, MD  Active   amLODipine  (NORVASC ) 10 MG tablet [id number] Yes Take 1 tablet (10 mg total) by mouth daily. Vicci Barnie NOVAK, MD  Active Self  Blood Glucose Monitoring Suppl (ACCU-CHEK GUIDE) w/Device KIT [id number]  Check blood sugars 3 times a day before meals.  Patient not taking: Reported on 01/22/2025   Vicci Barnie NOVAK, MD  Active Self  chlorhexidine  (HIBICLENS ) 4 % external liquid [id number] Yes Apply 15 mLs (1 Application total) topically as directed for 30 doses. Use as directed daily for 5 days every other week for 6 weeks. Louis Shove, MD  Active   Continuous Glucose Receiver (FREESTYLE LIBRE 3 READER) [place] [id number] Yes Use to check glucose continuously. Vicci Barnie NOVAK, MD  Active Self  Continuous Glucose Sensor (FREESTYLE LIBRE 3 [place]) [place] [id number] Yes Place 1 sensor on the skin every 14 days. Use to check glucose continuously  Vicci Barnie NOVAK, MD  Active Self  diclofenac  Sodium (VOLTAREN ) 1 % GEL [id number] Yes APPLY TWO grams topically FOUR TIMES DAILY  Patient taking differently: Apply 2 g topically 4 (four) times daily as needed (pain).   Vicci Barnie NOVAK,  MD  Active Self  DULERA  100-5 MCG/ACT TERESE [id number] Yes Inhale 2 puffs into the lungs 2 (two) times daily. Vicci Barnie NOVAK, MD  Active Self  gabapentin  (NEURONTIN ) 300 MG capsule [id number] Yes TAKE ONE CAPSULE (300mg  total) BY MOUTH AT BEDTIME Vicci Barnie NOVAK, MD  Active Self  HYDROcodone -acetaminophen  (NORCO/VICODIN) 5-325 MG tablet [id number]  Take 1 tablet by mouth every 6 (six) hours as needed for moderate pain (pain score 4-6).  Patient not taking: Reported on 01/22/2025   [provider]  Active   Insulin  Glargine (BASAGLAR  KWIKPEN) 100 UNIT/ML [id number] Yes Inject 31 Units into the skin daily. Vicci Barnie NOVAK, MD  Active Self  Insulin  Pen Needle (LITETOUCH PEN NEEDLES) 31G X 6 MM MISC [id number] Yes USE TO administer insulin  EVERY DAY Vicci Barnie NOVAK, MD  Active Self  Lancets Misc. (ACCU-CHEK FASTCLIX LANCET) KIT [id number]  Check blood glucose at least 3 times daily.  Patient not taking: Reported on 01/22/2025   Alec House, MD  Active Self  losartan  (COZAAR ) 25 MG tablet [id number] Yes TAKE ONE TABLET (25mg  total) BY MOUTH EVERY DAY Vicci Barnie NOVAK, MD  Active Self  metFORMIN  (GLUCOPHAGE -XR) 500 MG 24 hr tablet [id number] Yes Take 2 tablets (1,000 mg total) by mouth 2 (two) times daily with a meal. Vicci Barnie NOVAK, MD  Active Self  methocarbamol  (ROBAXIN ) 500 MG tablet [id number] Yes Take 1 tablet (500 mg total) by mouth every 6 (six) hours as needed for muscle spasms. Louis Shove, MD  Active   mupirocin  ointment (BACTROBAN ) 2 % [id number] Yes Place 1 Application into the nose 2 (two) times daily for 60 doses. Use as directed 2 times daily for 5 days every other week for 6 weeks. Louis Shove, MD  Active   oxyCODONE -acetaminophen  (PERCOCET) 10-325 MG tablet [id number] Yes Take 1 tablet by mouth every 4 (four) hours as needed for pain.  Patient taking differently: Take 1 tablet by mouth every 6 (six) hours as needed for pain.   Louis Shove, MD  Active   tirzepatide   (MOUNJARO ) 10 MG/0.5ML Pen [id number] Yes Inject 10 mg into the skin once a week. Once she completes the 7.5 mg dose.  Patient taking differently: Inject 10 mg into the skin every Monday.   Vicci Barnie NOVAK, MD  Active Self  tiZANidine (ZANAFLEX) 4 MG tablet [id number]  Take 4 mg by mouth every 8 (eight) hours.  Patient not taking: Reported on 01/22/2025   [provider]  Active Self  traMADol  (ULTRAM ) 50 MG tablet [id number]  Take 50 mg by mouth every 6 (six) hours as needed (pain).  Patient not taking: Reported on 01/22/2025   [provider]  Active Self  Med List Note Broadus Reda CROME, RN 05/26/24 1526): 05-26-24 UDS            Recommendation:   Continue Current Plan of Care  Follow Up Plan:   Telephone follow-up in 1 week  Andrea Dimes RN, BSN [place]  Value-Based Care Institute [facility] RN Care Manager [phone]     "

## 2025-01-23 ENCOUNTER — Telehealth: Payer: Self-pay | Admitting: Internal Medicine

## 2025-01-23 NOTE — Telephone Encounter (Signed)
 Copied from CRM [id number]. Topic: Clinical - Medication Question >> Jan 23, 2025 11:34 AM   Gustabo D wrote:  MOUNJARO  10 MG/0.5ML Pen- Pt says she was never got her 2nd dose of the 10MG  she has finished her 7.5 MG and only got the 1st dose of the 10MG  she is now needing her 2nd dose of the 10MG . Pt would like a call back on why this has been denied.

## 2025-01-29 ENCOUNTER — Telehealth: Admitting: *Deleted

## 2025-02-06 ENCOUNTER — Ambulatory Visit: Payer: Self-pay | Admitting: Nurse Practitioner
